# Patient Record
Sex: Male | Born: 1995 | Race: White | Hispanic: No | Marital: Single
Health system: Southern US, Community
[De-identification: ages and names within clinical notes are randomized; demographics above are authoritative.]

## PROBLEM LIST (undated history)

## (undated) DIAGNOSIS — I10 Essential (primary) hypertension: Secondary | ICD-10-CM

---
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
## 2019-01-12 ENCOUNTER — Emergency Department (HOSPITAL_COMMUNITY): Payer: Medicaid - Out of State

## 2019-01-12 ENCOUNTER — Other Ambulatory Visit: Payer: Self-pay

## 2019-01-12 ENCOUNTER — Encounter (HOSPITAL_COMMUNITY): Payer: Self-pay

## 2019-01-12 ENCOUNTER — Emergency Department (HOSPITAL_COMMUNITY)
Admission: EM | Admit: 2019-01-12 | Discharge: 2019-01-12 | Disposition: A | Discharge status: Home / Self Care | Payer: Medicaid - Out of State | Attending: Emergency Medicine | Admitting: Emergency Medicine

## 2019-01-12 DIAGNOSIS — Y9301 Activity, walking, marching and hiking: Secondary | ICD-10-CM | POA: Insufficient documentation

## 2019-01-12 DIAGNOSIS — Z87891 Personal history of nicotine dependence: Secondary | ICD-10-CM | POA: Diagnosis not present

## 2019-01-12 DIAGNOSIS — I10 Essential (primary) hypertension: Secondary | ICD-10-CM | POA: Diagnosis not present

## 2019-01-12 DIAGNOSIS — Y929 Unspecified place or not applicable: Secondary | ICD-10-CM | POA: Insufficient documentation

## 2019-01-12 DIAGNOSIS — S8261XA Displaced fracture of lateral malleolus of right fibula, initial encounter for closed fracture: Secondary | ICD-10-CM | POA: Insufficient documentation

## 2019-01-12 DIAGNOSIS — Y999 Unspecified external cause status: Secondary | ICD-10-CM | POA: Diagnosis not present

## 2019-01-12 DIAGNOSIS — S99911A Unspecified injury of right ankle, initial encounter: Secondary | ICD-10-CM | POA: Diagnosis present

## 2019-01-12 DIAGNOSIS — W108XXA Fall (on) (from) other stairs and steps, initial encounter: Secondary | ICD-10-CM | POA: Diagnosis not present

## 2019-01-12 DIAGNOSIS — S82831A Other fracture of upper and lower end of right fibula, initial encounter for closed fracture: Secondary | ICD-10-CM

## 2019-01-12 DIAGNOSIS — Z79899 Other long term (current) drug therapy: Secondary | ICD-10-CM | POA: Insufficient documentation

## 2019-01-12 DIAGNOSIS — Z880 Allergy status to penicillin: Secondary | ICD-10-CM | POA: Insufficient documentation

## 2019-01-12 HISTORY — DX: Essential (primary) hypertension: I10

## 2019-01-12 MED ORDER — IBUPROFEN 600 MG PO TABS: 600.0000 mg | ORAL_TABLET | Freq: Four times a day (QID) | ORAL | 0 refills | Status: AC | PRN

## 2019-01-12 MED ORDER — HYDROCODONE-ACETAMINOPHEN 5-325 MG PO TABS: 1.0000 | ORAL_TABLET | Freq: Four times a day (QID) | ORAL | 0 refills | Status: AC | PRN

## 2019-01-12 NOTE — ED Provider Notes (Signed)
[facility] DEPT Provider Note   CSN: [id number] Arrival date & time: 01/12/19  0413    History   Chief Complaint Chief Complaint  Patient presents with  . Ankle Pain    HPI Jay Cole is a 23 y.o. male.     23 year old male presents to the emergency department for evaluation of right ankle pain.  He reports falling down approximately 5-6 steps tonight while he was carrying groceries and a new gaming system.  Has been experiencing constant, unchanged pain to the ankle.  This was splinted by EMS.  He reports receiving pain medication in route with some slight improvement to discomfort.  History of prior sprain and fracture to the right ankle, per patient.  Is not actively followed by orthopedics.  Denies any associated numbness or paresthesias.  The history is provided by the patient. No language interpreter was used.  Ankle Pain   Past Medical History:  Diagnosis Date  . Hypertension     There are no active problems to display for this patient.   No past surgical history on file.      Home Medications    Prior to Admission medications   Medication Sig Start Date End Date Taking? Authorizing Provider  acetaminophen (TYLENOL) 325 MG tablet Take 650 mg by mouth every 6 (six) hours as needed for moderate pain.   Yes [provider]  lamoTRIgine (LAMICTAL) 150 MG tablet Take 150 mg by mouth daily.   Yes [provider]  QUEtiapine (SEROQUEL) 300 MG tablet Take 150 mg by mouth at bedtime.   Yes [provider]  HYDROcodone-acetaminophen (NORCO/VICODIN) 5-325 MG tablet Take 1 tablet by mouth every 6 (six) hours as needed for severe pain. 01/12/19   Antonietta Breach, PA-C  ibuprofen (ADVIL) 600 MG tablet Take 1 tablet (600 mg total) by mouth every 6 (six) hours as needed. 01/12/19   Antonietta Breach, PA-C    Family History No family history on file.  Social History Social History   Tobacco Use  . Smoking status: Former  Smoker    Types: Cigarettes    Quit date: 06/20/2018    Years since quitting: 0.5  Substance Use Topics  . Alcohol use: No  . Drug use: No     Allergies   Codeine and Penicillins   Review of Systems Review of Systems Ten systems reviewed and are negative for acute change, except as noted in the HPI.    Physical Exam Updated Vital Signs BP (!) 147/91 (BP Location: Right Arm)   Pulse (!) 101   Temp 98.1 F (36.7 C) (Oral)   Resp 20   Ht 5\' 7"  (1.702 m)   Wt 113.4 kg   SpO2 97%   BMI 39.16 kg/m   Physical Exam Vitals signs and nursing note reviewed.  Constitutional:      General: He is not in acute distress.    Appearance: He is well-developed. He is not diaphoretic.     Comments: Nontoxic appearing and in NAD  HENT:     Head: Normocephalic and atraumatic.  Eyes:     General: No scleral icterus.    Conjunctiva/sclera: Conjunctivae normal.  Neck:     Musculoskeletal: Normal range of motion.  Cardiovascular:     Rate and Rhythm: Normal rate and regular rhythm.     Pulses: Normal pulses.     Comments: DP pulse 2+ in the RLE.  Capillary refill brisk in all digits of the right foot. Pulmonary:  Effort: Pulmonary effort is normal. No respiratory distress.     Comments: Respirations even and unlabored Musculoskeletal: Normal range of motion.        General: Swelling, tenderness, deformity (R ankle) and signs of injury present.     Comments: There is swelling to the lateral malleolus of the right ankle with loss of landmark at the site of the medial malleolus.  Swelling noted to the right ankle without crepitus.  Compartments of the right lower extremity are soft, compressible.  No significant effusion.  No erythema.  Skin:    General: Skin is warm and dry.     Coloration: Skin is not pale.     Findings: No erythema or rash.  Neurological:     General: No focal deficit present.     Mental Status: He is alert and oriented to person, place, and time.      Coordination: Coordination normal.     Comments: Sensation to light touch intact.  Patient able to wiggle all toes.  Psychiatric:        Behavior: Behavior normal.      ED Treatments / Results  Labs (all labs ordered are listed, but only abnormal results are displayed) Labs Reviewed - No data to display  EKG None  Radiology Dg Ankle Complete Right  Result Date: 01/12/2019 CLINICAL DATA:  Fall with ankle pain. History of multiple ankle fractures. EXAM: RIGHT ANKLE - COMPLETE 3+ VIEW COMPARISON:  None. FINDINGS: Moderate soft tissue swelling, greatest at the lateral malleolus. There is a fracture fragment at the inferior aspect of the lateral malleolus, with minimal displacement. Eight larger adjacent fragment is age indeterminate and could be related to a remote injury. IMPRESSION: Minimally displaced acute fracture of the inferior aspect of the right lateral malleolus with associated soft tissue swelling. Larger adjacent fragment may be related to remote trauma Electronically Signed   By: Ulyses Jarred M.D.   On: 01/12/2019 05:23    Procedures Procedures (including critical care time)  Medications Ordered in ED Medications - No data to display   Initial Impression / Assessment and Plan / ED Course  I have reviewed the triage vital signs and the nursing notes.  Pertinent labs & imaging results that were available during my care of the patient were reviewed by me and considered in my medical decision making (see chart for details).        Patient presenting to the emergency department for evaluation of right ankle pain after falling down a few stairs tonight.  He is neurovascularly intact with obvious swelling to the lateral malleolus.  His x-ray shows likely acute fracture to the distal fibula.  This is minimally displaced.  He was put in a cam walker and given crutches for WBAT.  Will refer to Orthopedics for follow up.  Return precautions discussed and provided. Patient  discharged in stable condition with no unaddressed concerns.   Final Clinical Impressions(s) / ED Diagnoses   Final diagnoses:  Closed fracture of distal end of right fibula, unspecified fracture morphology, initial encounter    ED Discharge Orders         Ordered    ibuprofen (ADVIL) 600 MG tablet  Every 6 hours PRN     01/12/19 0619    HYDROcodone-acetaminophen (NORCO/VICODIN) 5-325 MG tablet  Every 6 hours PRN     01/12/19 0619           Antonietta Breach, PA-C 01/12/19 [place], [place], MD 01/14/19 [phone]

## 2019-01-12 NOTE — Discharge Instructions (Signed)
We recommend that you wear a cam walker at all times for stabilization of your fracture.  Use crutches to prevent from putting weight on your right foot.  Call the office of Dr. Wynelle Link on Monday to schedule follow-up in 1 to 2 weeks.  Ice your ankle 3-4 times per day.  Take ibuprofen for management of pain.  You may use Norco as needed for severe pain.  Do not drive or drink alcohol after taking this medication as it may make you drowsy and impair your judgment.  Return to the ED for any new or concerning symptoms.

## 2019-01-12 NOTE — ED Notes (Signed)
Pt verbalized discharge instructions and follow up care. Alert and ambulatory. Instructed and demonstrated how to use cam walker and crutches. No iv.

## 2019-01-12 NOTE — ED Triage Notes (Addendum)
Per ems: Pt coming from home c/o right ankle pain after falling down the stairs (5-6 steps approx). Swollen and noted deformity. Pedal pulses present. Reports no etoh. Currently splinted by ems. No LOC or head/ neck pain  20G L AC  200mg  fentanyl given by ems  144/88 98 HR 98% RA 194 CBG (pt has prediabetes) 18 RR

## 2019-06-24 ENCOUNTER — Encounter (HOSPITAL_COMMUNITY): Admission: EM | Disposition: E | Payer: Self-pay | Source: Home / Self Care

## 2019-06-24 ENCOUNTER — Inpatient Hospital Stay (HOSPITAL_COMMUNITY)
Admission: EM | Admit: 2019-06-24 | Discharge: 2019-09-19 | DRG: 003 | Disposition: E | Payer: Medicaid - Out of State | Attending: Neurological Surgery | Admitting: Neurological Surgery

## 2019-06-24 ENCOUNTER — Emergency Department (HOSPITAL_COMMUNITY): Payer: Medicaid - Out of State

## 2019-06-24 ENCOUNTER — Emergency Department (HOSPITAL_COMMUNITY): Payer: Medicaid - Out of State | Admitting: Anesthesiology

## 2019-06-24 ENCOUNTER — Other Ambulatory Visit: Payer: Self-pay

## 2019-06-24 DIAGNOSIS — Z880 Allergy status to penicillin: Secondary | ICD-10-CM

## 2019-06-24 DIAGNOSIS — Z452 Encounter for adjustment and management of vascular access device: Secondary | ICD-10-CM

## 2019-06-24 DIAGNOSIS — J9621 Acute and chronic respiratory failure with hypoxia: Secondary | ICD-10-CM

## 2019-06-24 DIAGNOSIS — S51822A Laceration with foreign body of left forearm, initial encounter: Secondary | ICD-10-CM | POA: Diagnosis present

## 2019-06-24 DIAGNOSIS — Z515 Encounter for palliative care: Secondary | ICD-10-CM | POA: Diagnosis not present

## 2019-06-24 DIAGNOSIS — S50852A Superficial foreign body of left forearm, initial encounter: Secondary | ICD-10-CM

## 2019-06-24 DIAGNOSIS — I959 Hypotension, unspecified: Secondary | ICD-10-CM | POA: Diagnosis not present

## 2019-06-24 DIAGNOSIS — R402112 Coma scale, eyes open, never, at arrival to emergency department: Secondary | ICD-10-CM | POA: Diagnosis present

## 2019-06-24 DIAGNOSIS — E669 Obesity, unspecified: Secondary | ICD-10-CM | POA: Diagnosis present

## 2019-06-24 DIAGNOSIS — R339 Retention of urine, unspecified: Secondary | ICD-10-CM | POA: Diagnosis not present

## 2019-06-24 DIAGNOSIS — E233 Hypothalamic dysfunction, not elsewhere classified: Secondary | ICD-10-CM | POA: Diagnosis not present

## 2019-06-24 DIAGNOSIS — R627 Adult failure to thrive: Secondary | ICD-10-CM

## 2019-06-24 DIAGNOSIS — E870 Hyperosmolality and hypernatremia: Secondary | ICD-10-CM | POA: Diagnosis not present

## 2019-06-24 DIAGNOSIS — E873 Alkalosis: Secondary | ICD-10-CM | POA: Diagnosis not present

## 2019-06-24 DIAGNOSIS — I10 Essential (primary) hypertension: Secondary | ICD-10-CM | POA: Diagnosis present

## 2019-06-24 DIAGNOSIS — E039 Hypothyroidism, unspecified: Secondary | ICD-10-CM | POA: Diagnosis present

## 2019-06-24 DIAGNOSIS — J189 Pneumonia, unspecified organism: Secondary | ICD-10-CM

## 2019-06-24 DIAGNOSIS — E8770 Fluid overload, unspecified: Secondary | ICD-10-CM | POA: Diagnosis present

## 2019-06-24 DIAGNOSIS — Z885 Allergy status to narcotic agent status: Secondary | ICD-10-CM | POA: Diagnosis not present

## 2019-06-24 DIAGNOSIS — M7989 Other specified soft tissue disorders: Secondary | ICD-10-CM | POA: Diagnosis not present

## 2019-06-24 DIAGNOSIS — J90 Pleural effusion, not elsewhere classified: Secondary | ICD-10-CM | POA: Diagnosis not present

## 2019-06-24 DIAGNOSIS — Z66 Do not resuscitate: Secondary | ICD-10-CM | POA: Diagnosis not present

## 2019-06-24 DIAGNOSIS — Z20822 Contact with and (suspected) exposure to covid-19: Secondary | ICD-10-CM | POA: Diagnosis present

## 2019-06-24 DIAGNOSIS — Y92413 State road as the place of occurrence of the external cause: Secondary | ICD-10-CM | POA: Diagnosis not present

## 2019-06-24 DIAGNOSIS — J9690 Respiratory failure, unspecified, unspecified whether with hypoxia or hypercapnia: Secondary | ICD-10-CM

## 2019-06-24 DIAGNOSIS — R402322 Coma scale, best motor response, extension, at arrival to emergency department: Secondary | ICD-10-CM | POA: Diagnosis present

## 2019-06-24 DIAGNOSIS — G936 Cerebral edema: Secondary | ICD-10-CM | POA: Diagnosis present

## 2019-06-24 DIAGNOSIS — Z6841 Body Mass Index (BMI) 40.0 and over, adult: Secondary | ICD-10-CM | POA: Diagnosis not present

## 2019-06-24 DIAGNOSIS — G9608 Other cranial cerebrospinal fluid leak: Secondary | ICD-10-CM | POA: Diagnosis present

## 2019-06-24 DIAGNOSIS — J80 Acute respiratory distress syndrome: Secondary | ICD-10-CM

## 2019-06-24 DIAGNOSIS — S27322A Contusion of lung, bilateral, initial encounter: Secondary | ICD-10-CM | POA: Diagnosis present

## 2019-06-24 DIAGNOSIS — R4182 Altered mental status, unspecified: Secondary | ICD-10-CM | POA: Diagnosis present

## 2019-06-24 DIAGNOSIS — S0990XA Unspecified injury of head, initial encounter: Secondary | ICD-10-CM

## 2019-06-24 DIAGNOSIS — S064XAA Epidural hemorrhage with loss of consciousness status unknown, initial encounter: Secondary | ICD-10-CM | POA: Diagnosis present

## 2019-06-24 DIAGNOSIS — S20312A Abrasion of left front wall of thorax, initial encounter: Secondary | ICD-10-CM | POA: Diagnosis present

## 2019-06-24 DIAGNOSIS — J15211 Pneumonia due to Methicillin susceptible Staphylococcus aureus: Secondary | ICD-10-CM | POA: Diagnosis not present

## 2019-06-24 DIAGNOSIS — E876 Hypokalemia: Secondary | ICD-10-CM | POA: Diagnosis not present

## 2019-06-24 DIAGNOSIS — R739 Hyperglycemia, unspecified: Secondary | ICD-10-CM | POA: Diagnosis present

## 2019-06-24 DIAGNOSIS — S065X9A Traumatic subdural hemorrhage with loss of consciousness of unspecified duration, initial encounter: Secondary | ICD-10-CM | POA: Diagnosis present

## 2019-06-24 DIAGNOSIS — E781 Pure hyperglyceridemia: Secondary | ICD-10-CM | POA: Diagnosis not present

## 2019-06-24 DIAGNOSIS — R6889 Other general symptoms and signs: Secondary | ICD-10-CM

## 2019-06-24 DIAGNOSIS — S064X9A Epidural hemorrhage with loss of consciousness of unspecified duration, initial encounter: Secondary | ICD-10-CM | POA: Diagnosis present

## 2019-06-24 DIAGNOSIS — Z9189 Other specified personal risk factors, not elsewhere classified: Secondary | ICD-10-CM

## 2019-06-24 DIAGNOSIS — R402212 Coma scale, best verbal response, none, at arrival to emergency department: Secondary | ICD-10-CM | POA: Diagnosis present

## 2019-06-24 DIAGNOSIS — Z9911 Dependence on respirator [ventilator] status: Secondary | ICD-10-CM | POA: Diagnosis not present

## 2019-06-24 DIAGNOSIS — R1310 Dysphagia, unspecified: Secondary | ICD-10-CM | POA: Diagnosis not present

## 2019-06-24 DIAGNOSIS — R946 Abnormal results of thyroid function studies: Secondary | ICD-10-CM | POA: Diagnosis not present

## 2019-06-24 DIAGNOSIS — S22011A Stable burst fracture of first thoracic vertebra, initial encounter for closed fracture: Secondary | ICD-10-CM | POA: Diagnosis present

## 2019-06-24 DIAGNOSIS — Z7189 Other specified counseling: Secondary | ICD-10-CM

## 2019-06-24 DIAGNOSIS — Z930 Tracheostomy status: Secondary | ICD-10-CM

## 2019-06-24 DIAGNOSIS — L8990 Pressure ulcer of unspecified site, unspecified stage: Secondary | ICD-10-CM | POA: Insufficient documentation

## 2019-06-24 DIAGNOSIS — Z9289 Personal history of other medical treatment: Secondary | ICD-10-CM

## 2019-06-24 DIAGNOSIS — I472 Ventricular tachycardia: Secondary | ICD-10-CM | POA: Diagnosis not present

## 2019-06-24 DIAGNOSIS — T148XXA Other injury of unspecified body region, initial encounter: Secondary | ICD-10-CM

## 2019-06-24 DIAGNOSIS — S0990XS Unspecified injury of head, sequela: Secondary | ICD-10-CM | POA: Diagnosis not present

## 2019-06-24 DIAGNOSIS — I469 Cardiac arrest, cause unspecified: Secondary | ICD-10-CM | POA: Diagnosis not present

## 2019-06-24 DIAGNOSIS — R509 Fever, unspecified: Secondary | ICD-10-CM

## 2019-06-24 HISTORY — PX: CRANIOTOMY: SHX93

## 2019-06-24 LAB — ETHANOL: Alcohol, Ethyl (B): <10 mg/dL (ref <10)

## 2019-06-24 LAB — POCT I-STAT 7, (LYTES, BLD GAS, ICA,H+H)
Acid-base deficit: 8 mmol/L — ABNORMAL HIGH (ref 0.0–2.0)
Bicarbonate: 20.8 mmol/L (ref 20.0–28.0)
Calcium, Ion: 1.22 mmol/L (ref 1.15–1.40)
HCT: 47 % (ref 39.0–52.0)
Hemoglobin: 16 g/dL (ref 13.0–17.0)
O2 Saturation: 91 %
Potassium: 3.2 mmol/L — ABNORMAL LOW (ref 3.5–5.1)
Sodium: 139 mmol/L (ref 135–145)
TCO2: 22 mmol/L (ref 22–32)
pCO2 arterial: 53 mmHg — ABNORMAL HIGH (ref 32.0–48.0)
pH, Arterial: 7.202 — ABNORMAL LOW (ref 7.350–7.450)
pO2, Arterial: 76 mmHg — ABNORMAL LOW (ref 83.0–108.0)

## 2019-06-24 LAB — I-STAT CHEM 8, ED
BUN: 6 mg/dL (ref 6–20)
Calcium, Ion: 1.11 mmol/L — ABNORMAL LOW (ref 1.15–1.40)
Chloride: 104 mmol/L (ref 98–111)
Creatinine, Ser: 1.1 mg/dL (ref 0.61–1.24)
Glucose, Bld: 169 mg/dL — ABNORMAL HIGH (ref 70–99)
HCT: 52 % (ref 39.0–52.0)
Hemoglobin: 17.7 g/dL — ABNORMAL HIGH (ref 13.0–17.0)
Potassium: 3.3 mmol/L — ABNORMAL LOW (ref 3.5–5.1)
Sodium: 143 mmol/L (ref 135–145)
TCO2: 23 mmol/L (ref 22–32)

## 2019-06-24 LAB — PROTIME-INR
INR: 1.1 (ref 0.8–1.2)
Prothrombin Time: 14.2 seconds (ref 11.4–15.2)

## 2019-06-24 LAB — COMPREHENSIVE METABOLIC PANEL
ALT: 127 U/L — ABNORMAL HIGH (ref 0–44)
AST: 88 U/L — ABNORMAL HIGH (ref 15–41)
Albumin: 3.9 g/dL (ref 3.5–5.0)
Alkaline Phosphatase: 106 U/L (ref 38–126)
Anion gap: 17 — ABNORMAL HIGH (ref 5–15)
BUN: 7 mg/dL (ref 6–20)
CO2: 20 mmol/L — ABNORMAL LOW (ref 22–32)
Calcium: 8.7 mg/dL — ABNORMAL LOW (ref 8.9–10.3)
Chloride: 104 mmol/L (ref 98–111)
Creatinine, Ser: 1.22 mg/dL (ref 0.61–1.24)
GFR calc Af Amer: >60 mL/min (ref >60)
GFR calc non Af Amer: >60 mL/min (ref >60)
Glucose, Bld: 182 mg/dL — ABNORMAL HIGH (ref 70–99)
Potassium: 3.5 mmol/L (ref 3.5–5.1)
Sodium: 141 mmol/L (ref 135–145)
Total Bilirubin: 0.5 mg/dL (ref 0.3–1.2)
Total Protein: 6.6 g/dL (ref 6.5–8.1)

## 2019-06-24 LAB — CBC
HCT: 54 % — ABNORMAL HIGH (ref 39.0–52.0)
Hemoglobin: 17 g/dL (ref 13.0–17.0)
MCH: 29.1 pg (ref 26.0–34.0)
MCHC: 31.5 g/dL (ref 30.0–36.0)
MCV: 92.5 fL (ref 80.0–100.0)
Platelets: 345 10*3/uL (ref 150–400)
RBC: 5.84 MIL/uL — ABNORMAL HIGH (ref 4.22–5.81)
RDW: 12.4 % (ref 11.5–15.5)
WBC: 20.5 10*3/uL — ABNORMAL HIGH (ref 4.0–10.5)
nRBC: 0 % (ref 0.0–0.2)

## 2019-06-24 LAB — CDS SEROLOGY

## 2019-06-24 LAB — RESPIRATORY PANEL BY RT PCR (FLU A&B, COVID)
Influenza A by PCR: NEGATIVE
Influenza B by PCR: NEGATIVE
SARS Coronavirus 2 by RT PCR: NEGATIVE

## 2019-06-24 SURGERY — CRANIOTOMY HEMATOMA EVACUATION SUBDURAL
Anesthesia: General | Site: Head | Laterality: Left

## 2019-06-24 MED ORDER — ROCURONIUM BROMIDE 10 MG/ML (PF) SYRINGE
PREFILLED_SYRINGE | INTRAVENOUS | Status: AC
  Filled 2019-06-24: qty 10

## 2019-06-24 MED ORDER — PROPOFOL 1000 MG/100ML IV EMUL
5.0000 ug/kg/min | INTRAVENOUS | Status: DC
  Administered 2019-06-25: 40 ug/kg/min via INTRAVENOUS
  Administered 2019-06-25: 20 ug/kg/min via INTRAVENOUS
  Administered 2019-06-25 – 2019-06-26 (×10): 40 ug/kg/min via INTRAVENOUS
  Administered 2019-06-27: 20 ug/kg/min via INTRAVENOUS
  Administered 2019-06-27: 02:00:00 40 ug/kg/min via INTRAVENOUS
  Administered 2019-06-28 (×2): 20 ug/kg/min via INTRAVENOUS
  Administered 2019-06-28 – 2019-06-29 (×2): 35 ug/kg/min via INTRAVENOUS
  Administered 2019-06-29: 15:00:00 46.471 ug/kg/min via INTRAVENOUS
  Administered 2019-06-29: 35 ug/kg/min via INTRAVENOUS
  Administered 2019-06-29: 46.471 ug/kg/min via INTRAVENOUS
  Administered 2019-06-29 (×2): 35 ug/kg/min via INTRAVENOUS
  Administered 2019-06-30: 30 ug/kg/min via INTRAVENOUS
  Administered 2019-06-30: 10 ug/kg/min via INTRAVENOUS
  Administered 2019-07-01 – 2019-07-02 (×10): 40 ug/kg/min via INTRAVENOUS
  Administered 2019-07-02 – 2019-07-04 (×12): 50 ug/kg/min via INTRAVENOUS
  Administered 2019-07-04: 40 ug/kg/min via INTRAVENOUS
  Administered 2019-07-05 – 2019-07-08 (×23): 50 ug/kg/min via INTRAVENOUS
  Administered 2019-07-09 (×2): 40 ug/kg/min via INTRAVENOUS
  Administered 2019-07-09: 50 ug/kg/min via INTRAVENOUS
  Administered 2019-07-09: 40 ug/kg/min via INTRAVENOUS
  Administered 2019-07-09: 50 ug/kg/min via INTRAVENOUS
  Administered 2019-07-10: 30 ug/kg/min via INTRAVENOUS
  Administered 2019-07-10: 12:00:00 40 ug/kg/min via INTRAVENOUS
  Administered 2019-07-10: 35 ug/kg/min via INTRAVENOUS
  Administered 2019-07-10 – 2019-07-11 (×2): 30 ug/kg/min via INTRAVENOUS
  Administered 2019-07-11: 35 ug/kg/min via INTRAVENOUS
  Administered 2019-07-11: 30 ug/kg/min via INTRAVENOUS
  Administered 2019-07-12: 45 ug/kg/min via INTRAVENOUS
  Administered 2019-07-12: 40 ug/kg/min via INTRAVENOUS
  Administered 2019-07-12: 30 ug/kg/min via INTRAVENOUS
  Administered 2019-07-12 – 2019-07-13 (×2): 40 ug/kg/min via INTRAVENOUS
  Filled 2019-06-24 (×7): qty 100
  Filled 2019-06-24 (×2): qty 200
  Filled 2019-06-24 (×4): qty 100
  Filled 2019-06-24: qty 300
  Filled 2019-06-24: qty 200
  Filled 2019-06-24 (×24): qty 100
  Filled 2019-06-24: qty 50
  Filled 2019-06-24 (×5): qty 100
  Filled 2019-06-24: qty 200
  Filled 2019-06-24 (×15): qty 100
  Filled 2019-06-24: qty 50
  Filled 2019-06-24 (×23): qty 100

## 2019-06-24 MED ORDER — INSULIN ASPART 100 UNIT/ML ~~LOC~~ SOLN
0.0000 [IU] | SUBCUTANEOUS | Status: DC
  Administered 2019-06-25 – 2019-06-26 (×4): 3 [IU] via SUBCUTANEOUS
  Administered 2019-06-27 (×2): 4 [IU] via SUBCUTANEOUS
  Administered 2019-06-27 (×3): 3 [IU] via SUBCUTANEOUS
  Administered 2019-06-28 – 2019-06-29 (×6): 4 [IU] via SUBCUTANEOUS
  Administered 2019-06-29: 3 [IU] via SUBCUTANEOUS
  Administered 2019-06-29 (×2): 4 [IU] via SUBCUTANEOUS
  Administered 2019-06-29: 16:00:00 2 [IU] via SUBCUTANEOUS
  Administered 2019-06-29 (×2): 4 [IU] via SUBCUTANEOUS
  Administered 2019-06-30: 3 [IU] via SUBCUTANEOUS
  Administered 2019-06-30: 09:00:00 4 [IU] via SUBCUTANEOUS
  Administered 2019-06-30 – 2019-07-05 (×16): 3 [IU] via SUBCUTANEOUS
  Administered 2019-07-05: 4 [IU] via SUBCUTANEOUS
  Administered 2019-07-06: 3 [IU] via SUBCUTANEOUS
  Administered 2019-07-06: 16:00:00 4 [IU] via SUBCUTANEOUS
  Administered 2019-07-06: 3 [IU] via SUBCUTANEOUS
  Administered 2019-07-06: 4 [IU] via SUBCUTANEOUS
  Administered 2019-07-07 – 2019-07-11 (×17): 3 [IU] via SUBCUTANEOUS
  Administered 2019-07-12: 2 [IU] via SUBCUTANEOUS
  Administered 2019-07-12: 3 [IU] via SUBCUTANEOUS
  Administered 2019-07-12 (×2): 4 [IU] via SUBCUTANEOUS
  Administered 2019-07-13 – 2019-07-17 (×16): 3 [IU] via SUBCUTANEOUS
  Administered 2019-07-17: 4 [IU] via SUBCUTANEOUS
  Administered 2019-07-18 (×3): 3 [IU] via SUBCUTANEOUS
  Administered 2019-07-19 – 2019-07-20 (×2): 4 [IU] via SUBCUTANEOUS
  Administered 2019-07-20 – 2019-07-21 (×5): 3 [IU] via SUBCUTANEOUS
  Administered 2019-07-21: 4 [IU] via SUBCUTANEOUS
  Administered 2019-07-21 (×2): 3 [IU] via SUBCUTANEOUS
  Administered 2019-07-21 – 2019-07-22 (×4): 4 [IU] via SUBCUTANEOUS
  Administered 2019-07-22: 3 [IU] via SUBCUTANEOUS
  Administered 2019-07-23 (×4): 4 [IU] via SUBCUTANEOUS
  Administered 2019-07-24: 3 [IU] via SUBCUTANEOUS
  Administered 2019-07-24: 4 [IU] via SUBCUTANEOUS
  Administered 2019-07-24 – 2019-07-25 (×6): 3 [IU] via SUBCUTANEOUS
  Administered 2019-07-25: 4 [IU] via SUBCUTANEOUS
  Administered 2019-07-26 – 2019-07-31 (×19): 3 [IU] via SUBCUTANEOUS
  Administered 2019-07-31: 4 [IU] via SUBCUTANEOUS
  Administered 2019-07-31: 13:00:00 3 [IU] via SUBCUTANEOUS
  Administered 2019-08-01: 4 [IU] via SUBCUTANEOUS
  Administered 2019-08-01: 3 [IU] via SUBCUTANEOUS
  Administered 2019-08-01: 4 [IU] via SUBCUTANEOUS
  Administered 2019-08-01 – 2019-08-03 (×7): 3 [IU] via SUBCUTANEOUS
  Administered 2019-08-03: 4 [IU] via SUBCUTANEOUS
  Administered 2019-08-04 (×2): 3 [IU] via SUBCUTANEOUS
  Administered 2019-08-04: 4 [IU] via SUBCUTANEOUS
  Administered 2019-08-06 – 2019-08-09 (×7): 3 [IU] via SUBCUTANEOUS
  Administered 2019-08-09 – 2019-08-10 (×2): 4 [IU] via SUBCUTANEOUS
  Administered 2019-08-10 – 2019-08-16 (×11): 3 [IU] via SUBCUTANEOUS
  Administered 2019-08-17: 4 [IU] via SUBCUTANEOUS
  Administered 2019-08-17 (×3): 3 [IU] via SUBCUTANEOUS
  Administered 2019-08-17: 4 [IU] via SUBCUTANEOUS
  Administered 2019-08-18 – 2019-08-20 (×9): 3 [IU] via SUBCUTANEOUS
  Administered 2019-08-20: 4 [IU] via SUBCUTANEOUS
  Administered 2019-08-20 – 2019-08-27 (×16): 3 [IU] via SUBCUTANEOUS
  Administered 2019-08-27: 4 [IU] via SUBCUTANEOUS
  Administered 2019-08-27: 3 [IU] via SUBCUTANEOUS
  Administered 2019-08-27 – 2019-08-28 (×2): 4 [IU] via SUBCUTANEOUS
  Administered 2019-08-28 – 2019-08-31 (×18): 3 [IU] via SUBCUTANEOUS
  Administered 2019-08-31: 4 [IU] via SUBCUTANEOUS
  Administered 2019-09-01 (×2): 3 [IU] via SUBCUTANEOUS
  Administered 2019-09-01: 4 [IU] via SUBCUTANEOUS
  Administered 2019-09-01 – 2019-09-02 (×5): 3 [IU] via SUBCUTANEOUS
  Administered 2019-09-02: 4 [IU] via SUBCUTANEOUS
  Administered 2019-09-02 – 2019-09-07 (×17): 3 [IU] via SUBCUTANEOUS
  Administered 2019-09-07 – 2019-09-08 (×3): 4 [IU] via SUBCUTANEOUS
  Administered 2019-09-08: 7 [IU] via SUBCUTANEOUS
  Administered 2019-09-08 (×4): 4 [IU] via SUBCUTANEOUS
  Administered 2019-09-09: 3 [IU] via SUBCUTANEOUS
  Administered 2019-09-09 – 2019-09-10 (×6): 4 [IU] via SUBCUTANEOUS
  Administered 2019-09-10: 3 [IU] via SUBCUTANEOUS
  Administered 2019-09-11: 4 [IU] via SUBCUTANEOUS

## 2019-06-24 MED ORDER — ONDANSETRON 4 MG PO TBDP: 4.0000 mg | ORAL_TABLET | Freq: Four times a day (QID) | ORAL | Status: DC | PRN

## 2019-06-24 MED ORDER — PROPOFOL 10 MG/ML IV BOLUS
INTRAVENOUS | Status: AC
  Filled 2019-06-24: qty 20

## 2019-06-24 MED ORDER — IOHEXOL 300 MG/ML  SOLN
100.0000 mL | Freq: Once | INTRAMUSCULAR | Status: AC | PRN
  Administered 2019-06-24: 100 mL via INTRAVENOUS

## 2019-06-24 MED ORDER — LIDOCAINE-EPINEPHRINE 1 %-1:100000 IJ SOLN
INTRAMUSCULAR | Status: AC
  Filled 2019-06-24: qty 1

## 2019-06-24 MED ORDER — FENTANYL CITRATE (PF) 100 MCG/2ML IJ SOLN
INTRAMUSCULAR | Status: AC
  Filled 2019-06-24: qty 2

## 2019-06-24 MED ORDER — DOCUSATE SODIUM 50 MG/5ML PO LIQD
100.0000 mg | Freq: Two times a day (BID) | ORAL | Status: DC
  Administered 2019-06-25 – 2019-06-28 (×8): 100 mg
  Filled 2019-06-24 (×9): qty 10

## 2019-06-24 MED ORDER — PHENYLEPHRINE HCL (PRESSORS) 10 MG/ML IV SOLN
INTRAVENOUS | Status: DC | PRN
  Administered 2019-06-24: 80 ug via INTRAVENOUS

## 2019-06-24 MED ORDER — VANCOMYCIN HCL IN DEXTROSE 1-5 GM/200ML-% IV SOLN
INTRAVENOUS | Status: AC
  Filled 2019-06-24: qty 200

## 2019-06-24 MED ORDER — VANCOMYCIN HCL 1000 MG IV SOLR
INTRAVENOUS | Status: DC | PRN
  Administered 2019-06-24: 22:00:00 1000 mg via INTRAVENOUS

## 2019-06-24 MED ORDER — SUFENTANIL CITRATE 50 MCG/ML IV SOLN
INTRAVENOUS | Status: AC
  Filled 2019-06-24: qty 1

## 2019-06-24 MED ORDER — LIDOCAINE-EPINEPHRINE 1 %-1:100000 IJ SOLN
INTRAMUSCULAR | Status: DC | PRN
  Administered 2019-06-24 (×2): 5 mL

## 2019-06-24 MED ORDER — SUFENTANIL CITRATE 50 MCG/ML IV SOLN
INTRAVENOUS | Status: DC | PRN
  Administered 2019-06-24 (×5): 10 ug via INTRAVENOUS

## 2019-06-24 MED ORDER — THROMBIN 20000 UNITS EX SOLR: CUTANEOUS | Status: DC | PRN

## 2019-06-24 MED ORDER — THROMBIN 5000 UNITS EX SOLR
CUTANEOUS | Status: AC
  Filled 2019-06-24: qty 5000

## 2019-06-24 MED ORDER — THROMBIN 20000 UNITS EX SOLR
CUTANEOUS | Status: AC
  Filled 2019-06-24: qty 20000

## 2019-06-24 MED ORDER — ONDANSETRON HCL 4 MG/2ML IJ SOLN: 4.0000 mg | Freq: Four times a day (QID) | INTRAMUSCULAR | Status: DC | PRN

## 2019-06-24 MED ORDER — FENTANYL 2500MCG IN NS 250ML (10MCG/ML) PREMIX INFUSION
0.0000 ug/h | INTRAVENOUS | Status: DC
  Administered 2019-06-25: 100 ug/h via INTRAVENOUS
  Administered 2019-06-25 – 2019-06-27 (×5): 200 ug/h via INTRAVENOUS
  Administered 2019-06-27: 100 ug/h via INTRAVENOUS
  Administered 2019-06-28 – 2019-06-29 (×2): 150 ug/h via INTRAVENOUS
  Administered 2019-06-29: 125 ug/h via INTRAVENOUS
  Administered 2019-06-30: 150 ug/h via INTRAVENOUS
  Administered 2019-07-01 – 2019-07-02 (×4): 200 ug/h via INTRAVENOUS
  Administered 2019-07-03: 300 ug/h via INTRAVENOUS
  Administered 2019-07-03: 250 ug/h via INTRAVENOUS
  Administered 2019-07-03: 300 ug/h via INTRAVENOUS
  Administered 2019-07-04 – 2019-07-08 (×17): 400 ug/h via INTRAVENOUS
  Administered 2019-07-09 (×2): 300 ug/h via INTRAVENOUS
  Administered 2019-07-09: 375 ug/h via INTRAVENOUS
  Administered 2019-07-10: 300 ug/h via INTRAVENOUS
  Administered 2019-07-10: 275 ug/h via INTRAVENOUS
  Administered 2019-07-11: 250 ug/h via INTRAVENOUS
  Administered 2019-07-11: 275 ug/h via INTRAVENOUS
  Administered 2019-07-11 – 2019-07-12 (×2): 300 ug/h via INTRAVENOUS
  Administered 2019-07-12: 200 ug/h via INTRAVENOUS
  Administered 2019-07-13: 250 ug/h via INTRAVENOUS
  Administered 2019-07-13: 150 ug/h via INTRAVENOUS
  Administered 2019-07-14: 115 ug/h via INTRAVENOUS
  Administered 2019-07-14 – 2019-07-16 (×4): 150 ug/h via INTRAVENOUS
  Administered 2019-07-17: 100 ug/h via INTRAVENOUS
  Administered 2019-07-17: 150 ug/h via INTRAVENOUS
  Administered 2019-07-18: 100 ug/h via INTRAVENOUS
  Administered 2019-07-18 – 2019-07-21 (×6): 150 ug/h via INTRAVENOUS
  Administered 2019-07-22 – 2019-07-25 (×6): 100 ug/h via INTRAVENOUS
  Administered 2019-07-26: 07:00:00 125 ug/h via INTRAVENOUS
  Administered 2019-07-27: 19:00:00 150 ug/h via INTRAVENOUS
  Administered 2019-07-27: 05:00:00 125 ug/h via INTRAVENOUS
  Administered 2019-07-28 – 2019-07-30 (×6): 150 ug/h via INTRAVENOUS
  Administered 2019-07-31: 125 ug/h via INTRAVENOUS
  Administered 2019-07-31: 150 ug/h via INTRAVENOUS
  Administered 2019-08-01: 18:00:00 125 ug/h via INTRAVENOUS
  Administered 2019-08-02: 11:00:00 100 ug/h via INTRAVENOUS
  Administered 2019-08-03: 200 ug/h via INTRAVENOUS
  Administered 2019-08-04: 100 ug/h via INTRAVENOUS
  Administered 2019-08-05: 200 ug/h via INTRAVENOUS
  Administered 2019-08-05 (×2): 100 ug/h via INTRAVENOUS
  Administered 2019-08-06: 13:00:00 85 ug/h via INTRAVENOUS
  Administered 2019-08-07: 14:00:00 175 ug/h via INTRAVENOUS
  Administered 2019-08-08: 200 ug/h via INTRAVENOUS
  Administered 2019-08-08: 175 ug/h via INTRAVENOUS
  Administered 2019-08-09: 200 ug/h via INTRAVENOUS
  Administered 2019-08-09: 150 ug/h via INTRAVENOUS
  Administered 2019-08-10 (×3): 200 ug/h via INTRAVENOUS
  Administered 2019-08-11: 150 ug/h via INTRAVENOUS
  Administered 2019-08-11: 125 ug/h via INTRAVENOUS
  Administered 2019-08-13: 75 ug/h via INTRAVENOUS
  Administered 2019-08-14 – 2019-08-18 (×4): 50 ug/h via INTRAVENOUS
  Administered 2019-08-19 – 2019-08-22 (×3): 100 ug/h via INTRAVENOUS
  Administered 2019-08-22: 150 ug/h via INTRAVENOUS
  Administered 2019-08-23 – 2019-08-24 (×2): 100 ug/h via INTRAVENOUS
  Administered 2019-08-26 – 2019-08-27 (×2): 150 ug/h via INTRAVENOUS
  Administered 2019-08-28: 100 ug/h via INTRAVENOUS
  Administered 2019-08-28 – 2019-08-31 (×5): 150 ug/h via INTRAVENOUS
  Administered 2019-09-01: 200 ug/h via INTRAVENOUS
  Administered 2019-09-01: 150 ug/h via INTRAVENOUS
  Administered 2019-09-02 – 2019-09-03 (×3): 200 ug/h via INTRAVENOUS
  Administered 2019-09-04 (×2): 175 ug/h via INTRAVENOUS
  Administered 2019-09-05: 75 ug/h via INTRAVENOUS
  Filled 2019-06-24 (×131): qty 250

## 2019-06-24 MED ORDER — SODIUM CHLORIDE 0.9 % IV SOLN: INTRAVENOUS | Status: DC | PRN

## 2019-06-24 MED ORDER — SODIUM CHLORIDE (PF) 0.9 % IJ SOLN
INTRAMUSCULAR | Status: AC
  Filled 2019-06-24: qty 10

## 2019-06-24 MED ORDER — PROPOFOL 1000 MG/100ML IV EMUL: 5.0000 ug/kg/min | INTRAVENOUS | Status: DC

## 2019-06-24 MED ORDER — DEXTROSE 5 % IV SOLN
3.0000 g | Freq: Once | INTRAVENOUS | Status: AC
  Administered 2019-06-24: 3 g via INTRAVENOUS
  Filled 2019-06-24 (×2): qty 3000

## 2019-06-24 MED ORDER — ROCURONIUM 10MG/ML (10ML) SYRINGE FOR MEDFUSION PUMP - OPTIME
INTRAVENOUS | Status: DC | PRN
  Administered 2019-06-24: 100 mg via INTRAVENOUS

## 2019-06-24 MED ORDER — METHOCARBAMOL 1000 MG/10ML IJ SOLN
1000.0000 mg | Freq: Three times a day (TID) | INTRAVENOUS | Status: DC
  Administered 2019-06-25 – 2019-07-19 (×72): 1000 mg via INTRAVENOUS
  Filled 2019-06-24 (×44): qty 10
  Filled 2019-06-24: qty 1000
  Filled 2019-06-24: qty 10
  Filled 2019-06-24: qty 1000
  Filled 2019-06-24 (×28): qty 10

## 2019-06-24 MED ORDER — BACITRACIN ZINC 500 UNIT/GM EX OINT
TOPICAL_OINTMENT | CUTANEOUS | Status: AC
  Filled 2019-06-24: qty 28.35

## 2019-06-24 MED ORDER — ETOMIDATE 2 MG/ML IV SOLN
INTRAVENOUS | Status: AC | PRN
  Administered 2019-06-24: 10 mg via INTRAVENOUS

## 2019-06-24 MED ORDER — PANTOPRAZOLE SODIUM 40 MG PO TBEC: 40.0000 mg | DELAYED_RELEASE_TABLET | Freq: Every day | ORAL | Status: DC

## 2019-06-24 MED ORDER — TRANEXAMIC ACID-NACL 1000-0.7 MG/100ML-% IV SOLN
1000.0000 mg | Freq: Once | INTRAVENOUS | Status: AC
  Administered 2019-06-24: 1000 mg via INTRAVENOUS
  Filled 2019-06-24: qty 100

## 2019-06-24 MED ORDER — 0.9 % SODIUM CHLORIDE (POUR BTL) OPTIME
TOPICAL | Status: DC | PRN
  Administered 2019-06-24 (×2): 1000 mL

## 2019-06-24 MED ORDER — TETANUS-DIPHTH-ACELL PERTUSSIS 5-2.5-18.5 LF-MCG/0.5 IM SUSP
0.5000 mL | Freq: Once | INTRAMUSCULAR | Status: AC
  Administered 2019-06-24: 0.5 mL via INTRAMUSCULAR

## 2019-06-24 MED ORDER — LEVETIRACETAM IN NACL 500 MG/100ML IV SOLN
500.0000 mg | Freq: Two times a day (BID) | INTRAVENOUS | Status: DC
  Administered 2019-06-25: 500 mg via INTRAVENOUS
  Filled 2019-06-24: qty 100

## 2019-06-24 MED ORDER — BUPIVACAINE HCL (PF) 0.5 % IJ SOLN
INTRAMUSCULAR | Status: AC
  Filled 2019-06-24: qty 30

## 2019-06-24 MED ORDER — PROPOFOL 1000 MG/100ML IV EMUL
INTRAVENOUS | Status: AC
  Administered 2019-06-24: 20 ug/kg/min via INTRAVENOUS
  Filled 2019-06-24: qty 100

## 2019-06-24 MED ORDER — PANTOPRAZOLE SODIUM 40 MG IV SOLR: 40.0000 mg | Freq: Every day | INTRAVENOUS | Status: DC

## 2019-06-24 MED ORDER — BUPIVACAINE HCL (PF) 0.5 % IJ SOLN
INTRAMUSCULAR | Status: DC | PRN
  Administered 2019-06-24 (×2): 5 mL

## 2019-06-24 MED ORDER — ACETAMINOPHEN 500 MG PO TABS
1000.0000 mg | ORAL_TABLET | Freq: Four times a day (QID) | ORAL | Status: DC
  Administered 2019-06-25 – 2019-06-29 (×17): 1000 mg
  Filled 2019-06-24 (×19): qty 2

## 2019-06-24 MED ORDER — FENTANYL CITRATE (PF) 100 MCG/2ML IJ SOLN
50.0000 ug | Freq: Once | INTRAMUSCULAR | Status: AC
  Administered 2019-06-25: 50 ug via INTRAVENOUS

## 2019-06-24 MED ORDER — OXYCODONE HCL 5 MG/5ML PO SOLN
5.0000 mg | ORAL | Status: DC | PRN
  Administered 2019-06-30 – 2019-08-05 (×35): 10 mg
  Filled 2019-06-24 (×36): qty 10

## 2019-06-24 MED ORDER — FENTANYL BOLUS VIA INFUSION
50.0000 ug | INTRAVENOUS | Status: DC | PRN
  Administered 2019-06-28: 150 ug via INTRAVENOUS
  Administered 2019-06-28 – 2019-08-26 (×93): 50 ug via INTRAVENOUS
  Administered 2019-08-26: 100 ug via INTRAVENOUS
  Administered 2019-08-26 (×3): 50 ug via INTRAVENOUS
  Filled 2019-06-24 (×2): qty 50

## 2019-06-24 MED ORDER — PROPOFOL 10 MG/ML IV BOLUS
INTRAVENOUS | Status: DC | PRN
  Administered 2019-06-24: 50 mg via INTRAVENOUS

## 2019-06-24 MED ORDER — ROCURONIUM BROMIDE 50 MG/5ML IV SOLN
INTRAVENOUS | Status: AC | PRN
  Administered 2019-06-24: 100 mg via INTRAVENOUS

## 2019-06-24 MED ORDER — LACTATED RINGERS IV SOLN: INTRAVENOUS | Status: DC

## 2019-06-24 MED ORDER — THROMBIN 5000 UNITS EX SOLR: OROMUCOSAL | Status: DC | PRN

## 2019-06-24 SURGICAL SUPPLY — 73 items
ADH SKN CLS APL DERMABOND .7 (GAUZE/BANDAGES/DRESSINGS) ×1
BAG DECANTER FOR FLEXI CONT (MISCELLANEOUS) ×3 IMPLANT
BATTERY IQ STERILE (MISCELLANEOUS) ×3 IMPLANT
BIT DRILL WIRE PASS 1.3MM (BIT) IMPLANT
BNDG GAUZE ELAST 4 BULKY (GAUZE/BANDAGES/DRESSINGS) ×3 IMPLANT
BTRY SRG DRVR 1.5 IQ (MISCELLANEOUS) ×1
BUR ACORN 6.0 PRECISION (BURR) ×2 IMPLANT
BUR ACORN 6.0MM PRECISION (BURR) ×1
BUR SPIRAL ROUTER 2.3 (BUR) IMPLANT
BUR SPIRAL ROUTER 2.3MM (BUR)
CANISTER SUCT 3000ML PPV (MISCELLANEOUS) ×5 IMPLANT
COUNTER NEEDLE 20 DBL MAG RED (NEEDLE) ×2 IMPLANT
DECANTER SPIKE VIAL GLASS SM (MISCELLANEOUS) ×3 IMPLANT
DERMABOND ADVANCED (GAUZE/BANDAGES/DRESSINGS) ×2
DERMABOND ADVANCED .7 DNX12 (GAUZE/BANDAGES/DRESSINGS) IMPLANT
DRAPE HALF SHEET 40X57 (DRAPES) ×4 IMPLANT
DRAPE INCISE IOBAN 66X45 STRL (DRAPES) ×4 IMPLANT
DRAPE LAPAROTOMY 100X72 PEDS (DRAPES) ×2 IMPLANT
DRAPE WARM FLUID 44X44 (DRAPES) ×3 IMPLANT
DRILL WIRE PASS 1.3MM (BIT) ×3
DRSG OPSITE 4X5.5 SM (GAUZE/BANDAGES/DRESSINGS) ×4 IMPLANT
DRSG TELFA 3X8 NADH (GAUZE/BANDAGES/DRESSINGS) ×3 IMPLANT
DURAPREP 26ML APPLICATOR (WOUND CARE) ×4 IMPLANT
DURAPREP 6ML APPLICATOR 50/CS (WOUND CARE) ×3 IMPLANT
ELECT CAUTERY BLADE 6.4 (BLADE) ×2 IMPLANT
ELECT REM PT RETURN 9FT ADLT (ELECTROSURGICAL) ×3
ELECTRODE REM PT RTRN 9FT ADLT (ELECTROSURGICAL) ×1 IMPLANT
GAUZE SPONGE 4X4 12PLY STRL (GAUZE/BANDAGES/DRESSINGS) ×2 IMPLANT
GAUZE SPONGE 4X4 16PLY XRAY LF (GAUZE/BANDAGES/DRESSINGS) ×2 IMPLANT
GLOVE BIO SURGEON STRL SZ 6.5 (GLOVE) ×4 IMPLANT
GLOVE BIO SURGEONS STRL SZ 6.5 (GLOVE) ×4
GLOVE BIOGEL PI IND STRL 7.0 (GLOVE) IMPLANT
GLOVE BIOGEL PI IND STRL 7.5 (GLOVE) IMPLANT
GLOVE BIOGEL PI IND STRL 8.5 (GLOVE) ×1 IMPLANT
GLOVE BIOGEL PI INDICATOR 7.0 (GLOVE) ×6
GLOVE BIOGEL PI INDICATOR 7.5 (GLOVE) ×6
GLOVE BIOGEL PI INDICATOR 8.5 (GLOVE) ×4
GLOVE ECLIPSE 8.5 STRL (GLOVE) ×5 IMPLANT
GLOVE EXAM NITRILE XL STR (GLOVE) IMPLANT
GOWN STRL REUS W/ TWL LRG LVL3 (GOWN DISPOSABLE) IMPLANT
GOWN STRL REUS W/ TWL XL LVL3 (GOWN DISPOSABLE) ×1 IMPLANT
GOWN STRL REUS W/TWL 2XL LVL3 (GOWN DISPOSABLE) ×3 IMPLANT
GOWN STRL REUS W/TWL LRG LVL3 (GOWN DISPOSABLE) ×6
GOWN STRL REUS W/TWL XL LVL3 (GOWN DISPOSABLE)
GRAFT DURAGEN MATRIX 3WX3L (Graft) ×3 IMPLANT
GRAFT DURAGEN MATRIX 3X3 SNGL (Graft) IMPLANT
HEMOSTAT POWDER SURGIFOAM 1G (HEMOSTASIS) ×2 IMPLANT
KIT BASIN OR (CUSTOM PROCEDURE TRAY) ×3 IMPLANT
KIT TURNOVER KIT B (KITS) ×3 IMPLANT
NEEDLE HYPO 22GX1.5 SAFETY (NEEDLE) ×3 IMPLANT
NS IRRIG 1000ML POUR BTL (IV SOLUTION) ×7 IMPLANT
PACK CRANIOTOMY CUSTOM (CUSTOM PROCEDURE TRAY) ×3 IMPLANT
PAD DRESSING TELFA 3X8 NADH (GAUZE/BANDAGES/DRESSINGS) IMPLANT
PATTIES SURGICAL .5 X.5 (GAUZE/BANDAGES/DRESSINGS) IMPLANT
PATTIES SURGICAL .5 X3 (DISPOSABLE) IMPLANT
PATTIES SURGICAL 1X1 (DISPOSABLE) IMPLANT
PIN MAYFIELD SKULL DISP (PIN) ×2 IMPLANT
PLATE 1.5  2HOLE LNG NEURO (Plate) ×4 IMPLANT
PLATE 1.5  2HOLE MED NEURO (Plate) ×4 IMPLANT
PLATE 1.5 2HOLE LNG NEURO (Plate) IMPLANT
PLATE 1.5 2HOLE MED NEURO (Plate) IMPLANT
SCREW SELF DRILL HT 1.5/4MM (Screw) ×16 IMPLANT
SPONGE NEURO XRAY DETECT 1X3 (DISPOSABLE) IMPLANT
SPONGE SURGIFOAM ABS GEL 100 (HEMOSTASIS) ×2 IMPLANT
STAPLER SKIN PROX WIDE 3.9 (STAPLE) ×3 IMPLANT
SUT ETHILON 3 0 FSL (SUTURE) IMPLANT
SUT NURALON 4 0 TR CR/8 (SUTURE) ×6 IMPLANT
SUT VIC AB 2-0 CP2 18 (SUTURE) ×8 IMPLANT
SUT VIC AB 3-0 SH 8-18 (SUTURE) ×2 IMPLANT
SUT VIC AB 4-0 RB1 18 (SUTURE) ×2 IMPLANT
TOWEL GREEN STERILE (TOWEL DISPOSABLE) ×3 IMPLANT
TOWEL GREEN STERILE FF (TOWEL DISPOSABLE) ×3 IMPLANT
WATER STERILE IRR 1000ML POUR (IV SOLUTION) ×3 IMPLANT

## 2019-06-24 NOTE — ED Notes (Signed)
Pt noted to be posturing and possibly seizing on arrival. Massive head trauma, open L forearm fx

## 2019-06-24 NOTE — Progress Notes (Signed)
Back from CT w/o complications

## 2019-06-24 NOTE — ED Notes (Signed)
NO RECTAL TONE ON EXAM, no blood in vault

## 2019-06-24 NOTE — ED Notes (Signed)
MD Day at [facility] preparing for intubation

## 2019-06-24 NOTE — Anesthesia Preprocedure Evaluation (Signed)
Anesthesia Evaluation  Patient identified by MRN, date of birth, ID band Patient unresponsive    Reviewed: Patient's Chart, lab work & pertinent test resultsPreop documentation limited or incomplete due to emergent nature of procedure.  Airway Mallampati: Intubated       Dental   Pulmonary     + decreased breath sounds      Cardiovascular  Rhythm:Regular Rate:Tachycardia     Neuro/Psych    GI/Hepatic   Endo/Other    Renal/GU      Musculoskeletal   Abdominal (+) + obese,   Peds  Hematology   Anesthesia Other Findings   Reproductive/Obstetrics                             Anesthesia Physical Anesthesia Plan  ASA: IV and emergent  Anesthesia Plan: General   Post-op Pain Management:    Induction: Intravenous  PONV Risk Score and Plan: Ondansetron and Dexamethasone  Airway Management Planned: Oral ETT  Additional Equipment: Arterial line and CVP  Intra-op Plan:   Post-operative Plan: Post-operative intubation/ventilation  Informed Consent:   Plan Discussed with: CRNA and Anesthesiologist  Anesthesia Plan Comments:         Anesthesia Quick Evaluation

## 2019-06-24 NOTE — ED Notes (Signed)
MD Lovick at bedside attempting cordis access to L fem
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
## 2019-06-24 NOTE — Progress Notes (Signed)
Chaplain responded to page.  Patient not available. Intubation underway.  No family present that chaplain is aware of.  Will continue to follow. Rev. Tamsen Snider

## 2019-06-24 NOTE — ED Notes (Signed)
C-spine immobilization maintained during intubation

## 2019-06-24 NOTE — ED Provider Notes (Signed)
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 severe abrasions to his left chest and large hematoma to left parietal scalp. He presents unresponsive, hemodynamically labile, unresponsive, decerebrate posturing, irregular respirations with bag valve mask, gcs 3, left arm with open fracture. No medications prior to arrival.  Airway not intact: Obtained intraosseous access. Low dose etomidate and full dose rocuronium for rsi. Intubated urgently for airway protection and likely decompensation.  Bilateral breath sounds present Initial manual blood pressure 70 systolic, called for 1 unit of emergency release blood. Repeat blood pressures improving. Fast exam negative. Trauma attending placed central access.  His blood pressure was initially low and steadily increased becoming severely hypertensive. Suspect increased icp. 3% saline administered, head of bed elevated. Cxr and pelvis unremarkable, tubes/lines in appropriate position. Taken to scanner emergently for pan scans.   He has obvious intracranial bleeding. No c spine injury. He has pulmonary contusion and T1 tp fracture and surprisingly no other chest or abdominal  injury. He remained hypertensive, likely due to elevated icp. Will sedate to treat his icp. Patient to be admitted to the icu and planning for neurosurgical intervention. Patient remained unresponsive and hypertensive and was transferred to the icu.     Final Clinical Impression(s) / ED Diagnoses Final diagnoses:  Fracture  Fracture  Fracture  Respiratory failure (  [facility])    Rx / DC Orders ED Discharge Orders    None       Daisean Brodhead, Lovena Le, MD 06/25/19 0086    Gareth Morgan, MD 06/27/19 1419

## 2019-06-24 NOTE — H&P (Addendum)
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 Respiratory failure - intubated for airway protection due to low GCS, high ventilator settings currently, likely due to an element of aspiration, wean as tolerated, starting with FiO2 to maintain sats >92%. Fentanyl/propofol for sedation.  Left T1 TP fracture - pain control Multiple abrasions - local wound care, rec'd tetanus and ancef in TB Hyperglycemia -  SSI and CBG FEN - NPO, MIVF DVT - SCDs, hold chemical ppx due to clinical condition Dispo - Admit to inpatient--ICU  Family update: both parents present in ED and collaborative discussion held with them and Dr. Ellene Route regarding injuries, clinical condition, need for emergent operative intervention, and plan for admission with expectation of hospital course.   Critical care time: 90 minutes  [name], MD General and [facility]

## 2019-06-24 NOTE — Procedures (Signed)
   Procedure Note  Date: 07/18/2019  Procedure: central venous catheter placement--left, femoral vein, without ultrasound guidance  Pre-op diagnosis: need for IV access  Post-op diagnosis: same  Surgeon: [name], MD  Anesthesia: none EBL: <5cc Drains/Implants: 9Fr cm, single lumen central venous catheter  Description of procedure: This procedure was performed emergently and therefore informed consent was not obtained. The left groin was prepped and draped in the usual sterile fashion. The femoral vein was accessed using anatomic landmarks and an introducer needle and a guidewire passed through the needle. The needle was removed and a skin nick was made. The tract was dilated and the central venous catheter advanced over the guidewire followed by removal of the guidewire. All ports drew blood easily and all were flushed with saline. The catheter was secured to the skin with suture and a sterile dressing. The patient tolerated the procedure well. There were no immediate complications. This procedure was performed emergently and therefore informed consent was not obtained.    Procedure: central venous catheter placement--right, femoral vein, without ultrasound guidance Pre-op diagnosis: need for IV access  Anesthesia: none EBL: <5cc Drains/Implants: triple lumen central venous catheter  The right groin was prepped and draped in the usual sterile fashion. The femoral vein was accessed using anatomic landmarks with an introducer needle and a guidewire passed through the needle. The needle was removed and a skin nick was made. The tract was dilated and the central venous catheter advanced over the guidewire followed by removal of the guidewire. All ports drew blood easily and all were flushed with saline. The catheter was secured to the skin with suture and a sterile dressing. The patient tolerated the procedure well. There were no immediate complications.     Procedure: arterial line  placement--right, radial artery, without ultrasound guidance Pre-op diagnosis: need for invasive blood pressure monitoring Anesthesia: none EBL: <5cc Drains/Implants: 4 cm arterial catheter  Description of procedure: This procedure was performed emergently and therefore informed consent was not obtained. The right wrist was prepped and draped in the usual sterile fashion. The artery was localized using anatomic landmarks and accessed using an arterial catheterization kit after 1 attempt(s). The wire was advanced and the sheath advanced over the wire. The needle and wire were removed with pulsatile, bright red blood noted through the catheter. The catheter was connected to a transducer and a good waveform was noted. The catheter was secured in place with suture and tegaderm. The patient tolerated the procedure well. There were no complications.     [name], MD General and [facility]
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
## 2019-06-24 NOTE — ED Notes (Signed)
7.5 ETT 25@ lip w/ good color change and equal blt breath sounds

## 2019-06-25 ENCOUNTER — Inpatient Hospital Stay (HOSPITAL_COMMUNITY): Payer: Medicaid - Out of State

## 2019-06-25 ENCOUNTER — Encounter: Payer: Self-pay | Admitting: *Deleted

## 2019-06-25 LAB — TYPE AND SCREEN
ABO/RH(D): O POS
Antibody Screen: NEGATIVE
Unit division: 0
Unit division: 0
Unit division: 0
Unit division: 0
Unit division: 0
Unit division: 0
Unit division: 0
Unit division: 0
Unit division: 0
Unit division: 0

## 2019-06-25 LAB — BASIC METABOLIC PANEL
Anion gap: 8 (ref 5–15)
BUN: 7 mg/dL (ref 6–20)
CO2: 20 mmol/L — ABNORMAL LOW (ref 22–32)
Calcium: 7.8 mg/dL — ABNORMAL LOW (ref 8.9–10.3)
Chloride: 113 mmol/L — ABNORMAL HIGH (ref 98–111)
Creatinine, Ser: 0.81 mg/dL (ref 0.61–1.24)
GFR calc Af Amer: >60 mL/min (ref >60)
GFR calc non Af Amer: >60 mL/min (ref >60)
Glucose, Bld: 109 mg/dL — ABNORMAL HIGH (ref 70–99)
Potassium: 4.7 mmol/L (ref 3.5–5.1)
Sodium: 141 mmol/L (ref 135–145)

## 2019-06-25 LAB — POCT I-STAT 7, (LYTES, BLD GAS, ICA,H+H)
Acid-base deficit: 6 mmol/L — ABNORMAL HIGH (ref 0.0–2.0)
Bicarbonate: 23.2 mmol/L (ref 20.0–28.0)
Calcium, Ion: 1.14 mmol/L — ABNORMAL LOW (ref 1.15–1.40)
HCT: 47 % (ref 39.0–52.0)
Hemoglobin: 16 g/dL (ref 13.0–17.0)
O2 Saturation: 95 %
Patient temperature: 35
Potassium: 2.9 mmol/L — ABNORMAL LOW (ref 3.5–5.1)
Sodium: 144 mmol/L (ref 135–145)
TCO2: 25 mmol/L (ref 22–32)
pCO2 arterial: 54.3 mmHg — ABNORMAL HIGH (ref 32.0–48.0)
pH, Arterial: 7.227 — ABNORMAL LOW (ref 7.350–7.450)
pO2, Arterial: 83 mmHg (ref 83.0–108.0)

## 2019-06-25 LAB — GLUCOSE, CAPILLARY
Glucose-Capillary: 100 mg/dL — ABNORMAL HIGH (ref 70–99)
Glucose-Capillary: 109 mg/dL — ABNORMAL HIGH (ref 70–99)
Glucose-Capillary: 111 mg/dL — ABNORMAL HIGH (ref 70–99)
Glucose-Capillary: 114 mg/dL — ABNORMAL HIGH (ref 70–99)
Glucose-Capillary: 128 mg/dL — ABNORMAL HIGH (ref 70–99)
Glucose-Capillary: 136 mg/dL — ABNORMAL HIGH (ref 70–99)
Glucose-Capillary: 87 mg/dL (ref 70–99)

## 2019-06-25 LAB — URINALYSIS, ROUTINE W REFLEX MICROSCOPIC
Bilirubin Urine: NEGATIVE
Glucose, UA: NEGATIVE mg/dL
Hgb urine dipstick: NEGATIVE
Ketones, ur: NEGATIVE mg/dL
Leukocytes,Ua: NEGATIVE
Nitrite: NEGATIVE
Protein, ur: 30 mg/dL — AB
Specific Gravity, Urine: >1.046 — ABNORMAL HIGH (ref 1.005–1.030)
pH: 5 (ref 5.0–8.0)

## 2019-06-25 LAB — BPAM FFP
Blood Product Expiration Date: 202101072359
Blood Product Expiration Date: 202101082359
Blood Product Expiration Date: 202101252359
Blood Product Expiration Date: 202101252359
ISSUE DATE / TIME: 202101041927
ISSUE DATE / TIME: 202101041927
ISSUE DATE / TIME: 202101051517
ISSUE DATE / TIME: 202101051517
Unit Type and Rh: 6200
Unit Type and Rh: 6200
Unit Type and Rh: 6200
Unit Type and Rh: 6200

## 2019-06-25 LAB — BPAM RBC
Blood Product Expiration Date: 202101142359
Blood Product Expiration Date: 202101142359
Blood Product Expiration Date: 202101262359
Blood Product Expiration Date: 202101302359
Blood Product Expiration Date: 202102042359
Blood Product Expiration Date: 202102042359
Blood Product Expiration Date: 202102042359
Blood Product Expiration Date: 202102042359
Blood Product Expiration Date: 202102042359
Blood Product Expiration Date: 202102042359
ISSUE DATE / TIME: 202101041921
ISSUE DATE / TIME: 202101041922
ISSUE DATE / TIME: 202101041923
ISSUE DATE / TIME: 202101042010
ISSUE DATE / TIME: 202101042010
ISSUE DATE / TIME: 202101042010
ISSUE DATE / TIME: 202101042010
ISSUE DATE / TIME: 202101050445
ISSUE DATE / TIME: 202101050649
ISSUE DATE / TIME: 202101051216
Unit Type and Rh: 5100
Unit Type and Rh: 5100
Unit Type and Rh: 5100
Unit Type and Rh: 5100
Unit Type and Rh: 5100
Unit Type and Rh: 5100
Unit Type and Rh: 5100
Unit Type and Rh: 5100
Unit Type and Rh: 9500
Unit Type and Rh: 9500

## 2019-06-25 LAB — PREPARE FRESH FROZEN PLASMA
Unit division: 0
Unit division: 0
Unit division: 0
Unit division: 0

## 2019-06-25 LAB — CBC
HCT: 44.6 % (ref 39.0–52.0)
Hemoglobin: 14.6 g/dL (ref 13.0–17.0)
MCH: 29 pg (ref 26.0–34.0)
MCHC: 32.7 g/dL (ref 30.0–36.0)
MCV: 88.5 fL (ref 80.0–100.0)
Platelets: 235 10*3/uL (ref 150–400)
RBC: 5.04 MIL/uL (ref 4.22–5.81)
RDW: 12.8 % (ref 11.5–15.5)
WBC: 34.7 10*3/uL — ABNORMAL HIGH (ref 4.0–10.5)
nRBC: 0 % (ref 0.0–0.2)

## 2019-06-25 LAB — PREPARE PLATELET PHERESIS: Unit division: 0

## 2019-06-25 LAB — MAGNESIUM
Magnesium: 2 mg/dL (ref 1.7–2.4)
Magnesium: 1.8 mg/dL (ref 1.7–2.4)
Magnesium: 1.9 mg/dL (ref 1.7–2.4)

## 2019-06-25 LAB — HIV ANTIBODY (ROUTINE TESTING W REFLEX): HIV Screen 4th Generation wRfx: NONREACTIVE

## 2019-06-25 LAB — BPAM PLATELET PHERESIS
Blood Product Expiration Date: 202101052359
ISSUE DATE / TIME: 202101042331
Unit Type and Rh: 600

## 2019-06-25 LAB — ABO/RH: ABO/RH(D): O POS

## 2019-06-25 LAB — LACTIC ACID, PLASMA: Lactic Acid, Venous: 3.2 mmol/L (ref 0.5–1.9)

## 2019-06-25 LAB — PHOSPHORUS
Phosphorus: 2.8 mg/dL (ref 2.5–4.6)
Phosphorus: 2.9 mg/dL (ref 2.5–4.6)
Phosphorus: 4 mg/dL (ref 2.5–4.6)

## 2019-06-25 LAB — TRIGLYCERIDES: Triglycerides: 164 mg/dL — ABNORMAL HIGH (ref <150)

## 2019-06-25 LAB — MRSA PCR SCREENING: MRSA by PCR: NEGATIVE

## 2019-06-25 MED ORDER — ASCORBIC ACID 500 MG PO TABS
1000.0000 mg | ORAL_TABLET | Freq: Three times a day (TID) | ORAL | Status: AC
  Administered 2019-06-25 – 2019-07-01 (×21): 1000 mg
  Filled 2019-06-25 (×21): qty 2

## 2019-06-25 MED ORDER — PIVOT 1.5 CAL PO LIQD
1000.0000 mL | ORAL | Status: DC
  Administered 2019-06-25 – 2019-07-11 (×8): 1000 mL
  Filled 2019-06-25 (×4): qty 1000

## 2019-06-25 MED ORDER — LABETALOL HCL 5 MG/ML IV SOLN: 10.0000 mg | INTRAVENOUS | Status: DC | PRN

## 2019-06-25 MED ORDER — SODIUM CHLORIDE 0.9 % IV BOLUS
1000.0000 mL | Freq: Once | INTRAVENOUS | Status: AC
  Administered 2019-06-25: 1000 mL via INTRAVENOUS

## 2019-06-25 MED ORDER — ORAL CARE MOUTH RINSE
15.0000 mL | OROMUCOSAL | Status: DC
  Administered 2019-06-25 – 2019-09-11 (×778): 15 mL via OROMUCOSAL

## 2019-06-25 MED ORDER — METOPROLOL TARTRATE 5 MG/5ML IV SOLN
2.5000 mg | INTRAVENOUS | Status: DC | PRN
  Administered 2019-06-25 (×4): 2.5 mg via INTRAVENOUS
  Filled 2019-06-25 (×4): qty 5

## 2019-06-25 MED ORDER — PROMETHAZINE HCL 25 MG PO TABS: 12.5000 mg | ORAL_TABLET | ORAL | Status: DC | PRN

## 2019-06-25 MED ORDER — SODIUM CHLORIDE 0.9% FLUSH
10.0000 mL | INTRAVENOUS | Status: DC | PRN
  Administered 2019-06-26: 11:00:00 10 mL

## 2019-06-25 MED ORDER — PRO-STAT SUGAR FREE PO LIQD
30.0000 mL | Freq: Three times a day (TID) | ORAL | Status: DC
  Administered 2019-06-25 – 2019-07-16 (×61): 30 mL
  Filled 2019-06-25 (×62): qty 30

## 2019-06-25 MED ORDER — HYDRALAZINE HCL 20 MG/ML IJ SOLN
5.0000 mg | INTRAMUSCULAR | Status: DC | PRN
  Administered 2019-06-25 (×2): 5 mg via INTRAVENOUS
  Filled 2019-06-25 (×2): qty 1

## 2019-06-25 MED ORDER — SODIUM CHLORIDE 0.9% FLUSH
10.0000 mL | Freq: Two times a day (BID) | INTRAVENOUS | Status: DC
  Administered 2019-06-26 – 2019-06-27 (×3): 10 mL
  Administered 2019-06-29: 40 mL
  Administered 2019-06-29: 10 mL
  Administered 2019-06-30: 40 mL
  Administered 2019-07-03: 30 mL
  Administered 2019-07-04: 20 mL
  Administered 2019-07-05 – 2019-07-08 (×5): 10 mL
  Administered 2019-07-08: 23:00:00 30 mL
  Administered 2019-07-09: 22:00:00 40 mL
  Administered 2019-07-09 – 2019-07-11 (×4): 10 mL

## 2019-06-25 MED ORDER — SELENIUM 200 MCG PO TABS
200.0000 ug | ORAL_TABLET | Freq: Every day | ORAL | Status: AC
  Administered 2019-06-25 – 2019-07-01 (×7): 200 ug
  Filled 2019-06-25 (×7): qty 1

## 2019-06-25 MED ORDER — TRANEXAMIC ACID 1000 MG/10ML IV SOLN
1000.0000 mg | INTRAVENOUS | Status: AC
  Administered 2019-06-25: 1000 mg via INTRAVENOUS
  Filled 2019-06-25: qty 10

## 2019-06-25 MED ORDER — PANTOPRAZOLE SODIUM 40 MG IV SOLR
40.0000 mg | Freq: Every day | INTRAVENOUS | Status: DC
  Administered 2019-06-25: 40 mg via INTRAVENOUS
  Filled 2019-06-25: qty 40

## 2019-06-25 MED ORDER — TRANEXAMIC ACID-NACL 1000-0.7 MG/100ML-% IV SOLN: 1000.0000 mg | Freq: Once | INTRAVENOUS | Status: DC

## 2019-06-25 MED ORDER — DOCUSATE SODIUM 100 MG PO CAPS: 100.0000 mg | ORAL_CAPSULE | Freq: Two times a day (BID) | ORAL | Status: DC

## 2019-06-25 MED ORDER — SENNOSIDES 8.8 MG/5ML PO SYRP: 8.8000 mg | ORAL_SOLUTION | Freq: Two times a day (BID) | ORAL | Status: DC

## 2019-06-25 MED ORDER — ONDANSETRON HCL 4 MG/2ML IJ SOLN: 4.0000 mg | INTRAMUSCULAR | Status: DC | PRN

## 2019-06-25 MED ORDER — BISACODYL 10 MG RE SUPP: 10.0000 mg | Freq: Every day | RECTAL | Status: DC | PRN

## 2019-06-25 MED ORDER — TRANEXAMIC ACID 1000 MG/10ML IV SOLN: 1000.0000 mg | Freq: Once | INTRAVENOUS | Status: DC

## 2019-06-25 MED ORDER — VANCOMYCIN HCL 1500 MG/300ML IV SOLN
1500.0000 mg | Freq: Two times a day (BID) | INTRAVENOUS | Status: AC
  Administered 2019-06-25 (×2): 1500 mg via INTRAVENOUS
  Filled 2019-06-25 (×2): qty 300

## 2019-06-25 MED ORDER — CHLORHEXIDINE GLUCONATE CLOTH 2 % EX PADS
6.0000 | MEDICATED_PAD | Freq: Every day | CUTANEOUS | Status: DC
  Administered 2019-06-26 – 2019-09-11 (×66): 6 via TOPICAL

## 2019-06-25 MED ORDER — VITAL HIGH PROTEIN PO LIQD
1000.0000 mL | ORAL | Status: DC
  Administered 2019-06-25: 1000 mL

## 2019-06-25 MED ORDER — PRO-STAT SUGAR FREE PO LIQD
30.0000 mL | Freq: Two times a day (BID) | ORAL | Status: DC
  Administered 2019-06-25: 30 mL
  Filled 2019-06-25: qty 30

## 2019-06-25 MED ORDER — CHLORHEXIDINE GLUCONATE 0.12% ORAL RINSE (MEDLINE KIT)
15.0000 mL | Freq: Two times a day (BID) | OROMUCOSAL | Status: DC
  Administered 2019-06-25 – 2019-09-11 (×158): 15 mL via OROMUCOSAL

## 2019-06-25 MED ORDER — LACTATED RINGERS IV BOLUS: 1000.0000 mL | Freq: Once | INTRAVENOUS | Status: DC

## 2019-06-25 MED ORDER — CHLORHEXIDINE GLUCONATE CLOTH 2 % EX PADS: 6.0000 | MEDICATED_PAD | Freq: Every day | CUTANEOUS | Status: DC

## 2019-06-25 MED ORDER — ONDANSETRON HCL 4 MG PO TABS: 4.0000 mg | ORAL_TABLET | ORAL | Status: DC | PRN

## 2019-06-25 MED ORDER — FLEET ENEMA 7-19 GM/118ML RE ENEM: 1.0000 | ENEMA | Freq: Once | RECTAL | Status: DC | PRN

## 2019-06-25 MED ORDER — POLYETHYLENE GLYCOL 3350 17 G PO PACK: 17.0000 g | PACK | Freq: Every day | ORAL | Status: DC | PRN

## 2019-06-25 MED ORDER — LEVETIRACETAM IN NACL 500 MG/100ML IV SOLN
500.0000 mg | Freq: Two times a day (BID) | INTRAVENOUS | Status: DC
  Administered 2019-06-25 – 2019-07-19 (×49): 500 mg via INTRAVENOUS
  Filled 2019-06-25 (×49): qty 100
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
## 2019-06-25 NOTE — Progress Notes (Signed)
Patient ID: Jay Cole, male   DOB: [birth year], 24 y.o.   MRN: [id number] I met with Jay Cole's mother at the bedside and updated her on his condition and the plan of care. [name] lives with her. He has a HX of Bipolar disorder and was being evaluated for possible DM and HTN by his primary care physician. I answered her questions.  Georganna Skeans, MD, MPH, FACS Please use [email or web address] to contact on call provider

## 2019-06-25 NOTE — Anesthesia Postprocedure Evaluation (Signed)
Anesthesia Post Note  Patient: Jay Cole  Procedure(s) Performed: CRANIOTOMY FOR EVACUATION OF HEMATOMA WITH PLACEMENT OF BONE FLAP INTO ABDOMEN (Left Head)     Patient location during evaluation: NICU Anesthesia Type: General Level of consciousness: sedated and patient remains intubated per anesthesia plan Pain management: pain level controlled Vital Signs Assessment: post-procedure vital signs reviewed and stable Respiratory status: patient remains intubated per anesthesia plan and patient on ventilator - see flowsheet for VS Cardiovascular status: stable and tachycardic Anesthetic complications: no    Last Vitals:  Vitals:   06/22/2019 1912 07/19/2019 1923  BP: (!) 192/159 (!) 170/100  Pulse: 99   Resp: 20 (!) 35  SpO2: (!) 80%     Last Pain: There were no vitals filed for this visit.               Maty Zeisler COKER

## 2019-06-25 NOTE — Progress Notes (Signed)
Patient ID: Jay Cole, male   DOB: 1995-07-20, 24 y.o.   MRN: [id number] Follow up - Trauma Critical Care  Patient Details:    Jay Cole is an 24 y.o. male.  Lines/tubes : Airway 7.5 mm (Active)  Secured at (cm) 25 cm 06/25/19 0346  Measured From Lips 06/25/19 0346  Secured Location Right 06/25/19 0346  Secured By [employer] 06/25/19 0346  Tube Holder Repositioned Yes 06/25/19 0346  Cuff Pressure (cm H2O) 30 cm H2O 06/25/19 0346  Site Condition Dry 06/25/19 0346     CVC Single Lumen 06/23/2019 Left Femoral (Active)  Indication for Insertion or Continuance of Line Vasoactive infusions 06/25/19 0740  Site Assessment Clean;Dry;Intact 06/25/19 0100  Line Status Flushed;Saline locked 06/25/19 0100  Dressing Type Transparent;Occlusive 06/25/19 0100  Dressing Status Clean;Dry;Intact;Antimicrobial disc in place 06/25/19 0100  Line Care Connections checked and tightened 06/25/19 0100  Dressing Change Due 07/01/19 06/25/19 0100     CVC Triple Lumen 07/03/2019 Right Femoral (Active)  Indication for Insertion or Continuance of Line Vasoactive infusions 06/25/19 0100  Site Assessment Clean;Dry;Intact 06/25/19 0100  Proximal Lumen Status Infusing 06/25/19 0100  Medial Lumen Status Infusing 06/25/19 0100  Distal Lumen Status Infusing 06/25/19 0100  Dressing Type Transparent;Occlusive 06/25/19 0100  Dressing Status Clean;Dry;Intact;Antimicrobial disc in place 06/25/19 0100  Line Care Connections checked and tightened 06/25/19 0100  Dressing Change Due 07/01/19 06/25/19 0100     Closed System Drain Left Scalp Bulb (JP) (Active)  Output (mL) 130 mL 06/25/19 0600     NG/OG Tube Orogastric 18 Fr. Center mouth Xray (Active)  External Length of Tube (cm) - (if applicable) 69 cm 123456 0100  Site Assessment Clean;Dry;Intact 06/25/19 0100  Ongoing Placement Verification No change in cm markings or external length of tube from initial placement;No change in respiratory status;No  acute changes, not attributed to clinical condition 06/25/19 0100  Status Clamped 06/25/19 0100     Urethral Catheter Jay Laurence MD Temperature probe 16 Fr. (Active)  Indication for Insertion or Continuance of Catheter Unstable critically ill patients first 24-48 hours (See Criteria) 06/25/19 0025  Site Assessment Clean;Intact 06/25/19 0025  Catheter Maintenance Bag below level of bladder;Catheter secured;Drainage bag/tubing not touching floor;No dependent loops;Seal intact;Insertion date on drainage bag 06/25/19 0025  Collection Container Standard drainage bag 06/25/19 0025  Securement Method Securing device (Describe) 06/25/19 0025  Urinary Catheter Interventions (if applicable) Unclamped 123456 0025  Output (mL) 450 mL 06/25/19 0600    Microbiology/Sepsis markers: Results for orders placed or performed during the hospital encounter of 07/20/2019  Respiratory Panel by RT PCR (Flu A&B, Covid) - Nasopharyngeal Swab     Status: None   Collection Time: 06/21/2019  8:36 PM   Specimen: Nasopharyngeal Swab  Result Value Ref Range Status   SARS Coronavirus 2 by RT PCR NEGATIVE NEGATIVE Final    Comment: (NOTE) SARS-CoV-2 target nucleic acids are NOT DETECTED. The SARS-CoV-2 RNA is generally detectable in upper respiratoy specimens during the acute phase of infection. The lowest concentration of SARS-CoV-2 viral copies this assay can detect is 131 copies/mL. A negative result does not preclude SARS-Cov-2 infection and should not be used as the sole basis for treatment or other patient management decisions. A negative result may occur with  improper specimen collection/handling, submission of specimen other than nasopharyngeal swab, presence of viral mutation(s) within the areas targeted by this assay, and inadequate number of viral copies (<131 copies/mL). A negative result must be combined with clinical observations, patient history,  and epidemiological information. The expected result is  Negative. Fact Sheet for Patients:  PinkCheek.be Fact Sheet for Healthcare Providers:  GravelBags.it This test is not yet ap proved or cleared by the [place] FDA and  has been authorized for detection and/or diagnosis of SARS-CoV-2 by FDA under an Emergency Use Authorization (EUA). This EUA will remain  in effect (meaning this test can be used) for the duration of the COVID-19 declaration under Section 564(b)(1) of the Act, 21 U.S.C. section 360bbb-3(b)(1), unless the authorization is terminated or revoked sooner.    Influenza A by PCR NEGATIVE NEGATIVE Final   Influenza B by PCR NEGATIVE NEGATIVE Final    Comment: (NOTE) The Xpert Xpress SARS-CoV-2/FLU/RSV assay is intended as an aid in  the diagnosis of influenza from Nasopharyngeal swab specimens and  should not be used as a sole basis for treatment. Nasal washings and  aspirates are unacceptable for Xpert Xpress SARS-CoV-2/FLU/RSV  testing. Fact Sheet for Patients: PinkCheek.be Fact Sheet for Healthcare Providers: GravelBags.it This test is not yet approved or cleared by the [place] FDA and  has been authorized for detection and/or diagnosis of SARS-CoV-2 by  FDA under an Emergency Use Authorization (EUA). This EUA will remain  in effect (meaning this test can be used) for the duration of the  Covid-19 declaration under Section 564(b)(1) of the Act, 21  U.S.C. section 360bbb-3(b)(1), unless the authorization is  terminated or revoked. Performed at [facility], [place] [address], [place], [place] [address]   MRSA PCR Screening     Status: None   Collection Time: 06/25/19 12:50 AM   Specimen: Nasal Mucosa; Nasopharyngeal  Result Value Ref Range Status   MRSA by PCR NEGATIVE NEGATIVE Final    Comment:        The GeneXpert MRSA Assay (FDA approved for NASAL specimens only), is one component of  a comprehensive MRSA colonization surveillance program. It is not intended to diagnose MRSA infection nor to guide or monitor treatment for MRSA infections. Performed at [facility], [place] [address] [place], [place] [address]     Anti-infectives:  Anti-infectives (From admission, onward)   Start     Dose/Rate Route Frequency Ordered Stop   06/25/19 0400  vancomycin (VANCOREADY) IVPB 1500 mg/300 mL     1,500 mg 150 mL/hr over 120 Minutes Intravenous Every 12 hours 06/25/19 0121 06/26/19 0359   06/29/2019 2228  bacitracin 50,000 Units in sodium chloride 0.9 % 500 mL irrigation  Status:  Discontinued       As needed 07/01/2019 2229 06/25/19 0005   06/23/2019 2000  ceFAZolin (ANCEF) 3 g in dextrose 5 % 50 mL IVPB     3 g 100 mL/hr over 30 Minutes Intravenous  Once 07/08/2019 1947 06/29/2019 2031      Best Practice/Protocols:  VTE Prophylaxis: Mechanical Continous Sedation  Consults: Treatment Team:  Marchia Bond, MD    Studies:    Events:  Subjective:    Overnight Issues:   Objective:  Vital signs for last 24 hours: Temp:  [96.8 F (36 C)-100.2 F (37.9 C)] 100.2 F (37.9 C) (01/05 0700) Pulse Rate:  [62-110] 98 (01/05 0700) Resp:  [20-40] 28 (01/05 0700) BP: (72-195)/(40-159) 127/73 (01/05 0700) SpO2:  [80 %-100 %] 100 % (01/05 0700) Arterial Line BP: (152-197)/(54-88) 161/68 (01/05 0700) FiO2 (%):  [50 %-100 %] 50 % (01/05 0521) Weight:  [85 kg-113.4 kg] 113.4 kg (01/04 2254)  Hemodynamic parameters for last 24 hours:  Intake/Output from previous day: 01/04 0701 - 01/05 0700 In: 4587.1 [I.V.:3302.3; IV Piggyback:1284.8] Out: 1075 [Urine:795; Drains:130; Blood:150]  Intake/Output this shift: No intake/output data recorded.  Vent settings for last 24 hours: Vent Mode: PRVC FiO2 (%):  [50 %-100 %] 50 % Set Rate:  [20 bmp-30 bmp] 30 bmp Vt Set:  [390 mL-540 mL] 390 mL PEEP:  [8 cmH20-12 cmH20] 10 cmH20 Plateau Pressure:  [18 cmH20-25 cmH20] 21  cmH20  Physical Exam:  General: on vent Neuro: pupils 87mm, no movement to pain HEENT/Neck: ETT and crani dressing Resp: clear to auscultation bilaterally CVS: RRR GI: soft, NT, large L abrasion Extremities: LUE abrasions  Results for orders placed or performed during the hospital encounter of 07/09/2019 (from the past 24 hour(s))  CDS serology     Status: None   Collection Time: 07/12/2019  7:23 PM  Result Value Ref Range   CDS serology specimen      SPECIMEN WILL BE HELD FOR 14 DAYS IF TESTING IS REQUIRED  Comprehensive metabolic panel     Status: Abnormal   Collection Time: 06/26/2019  7:23 PM  Result Value Ref Range   Sodium 141 135 - 145 mmol/L   Potassium 3.5 3.5 - 5.1 mmol/L   Chloride 104 98 - 111 mmol/L   CO2 20 (L) 22 - 32 mmol/L   Glucose, Bld 182 (H) 70 - 99 mg/dL   BUN 7 6 - 20 mg/dL   Creatinine, Ser 1.22 0.61 - 1.24 mg/dL   Calcium 8.7 (L) 8.9 - 10.3 mg/dL   Total Protein 6.6 6.5 - 8.1 g/dL   Albumin 3.9 3.5 - 5.0 g/dL   AST 88 (H) 15 - 41 U/L   ALT 127 (H) 0 - 44 U/L   Alkaline Phosphatase 106 38 - 126 U/L   Total Bilirubin 0.5 0.3 - 1.2 mg/dL   GFR calc non Af Amer >60 >60 mL/min   GFR calc Af Amer >60 >60 mL/min   Anion gap 17 (H) 5 - 15  CBC     Status: Abnormal   Collection Time: 07/06/2019  7:23 PM  Result Value Ref Range   WBC 20.5 (H) 4.0 - 10.5 K/uL   RBC 5.84 (H) 4.22 - 5.81 MIL/uL   Hemoglobin 17.0 13.0 - 17.0 g/dL   HCT 54.0 (H) 39.0 - 52.0 %   MCV 92.5 80.0 - 100.0 fL   MCH 29.1 26.0 - 34.0 pg   MCHC 31.5 30.0 - 36.0 g/dL   RDW 12.4 11.5 - 15.5 %   Platelets 345 150 - 400 K/uL   nRBC 0.0 0.0 - 0.2 %  Ethanol     Status: None   Collection Time: 07/09/2019  7:23 PM  Result Value Ref Range   Alcohol, Ethyl (B) <10 <10 mg/dL  Protime-INR     Status: None   Collection Time: 06/21/2019  7:23 PM  Result Value Ref Range   Prothrombin Time 14.2 11.4 - 15.2 seconds   INR 1.1 0.8 - 1.2  Type and screen Ordered by PROVIDER DEFAULT     Status: None    Collection Time: 07/01/2019  7:23 PM  Result Value Ref Range   ABO/RH(D) O POS    Antibody Screen NEG    Sample Expiration 06/27/2019,2359    Unit Number [id number]    Blood Component Type RBC LR PHER1    Unit division 00    Status of Unit REL FROM [facility]    Unit tag comment EMERGENCY RELEASE  Transfusion Status OK TO TRANSFUSE    Crossmatch Result PENDING    Unit Number [id number]    Blood Component Type RED CELLS,LR    Unit division 00    Status of Unit REL FROM [facility]    Unit tag comment EMERGENCY RELEASE    Transfusion Status OK TO TRANSFUSE    Crossmatch Result PENDING    Unit Number [id number]    Blood Component Type RED CELLS,LR    Unit division 00    Status of Unit REL FROM [facility]    Unit tag comment EMERGENCY RELEASE    Transfusion Status OK TO TRANSFUSE    Crossmatch Result PENDING    Unit Number [id number]    Blood Component Type RED CELLS,LR    Unit division 00    Status of Unit REL FROM [facility]    Unit tag comment EMERGENCY RELEASE    Transfusion Status OK TO TRANSFUSE    Crossmatch Result PENDING    Unit Number [id number]    Blood Component Type RED CELLS,LR    Unit division 00    Status of Unit REL FROM [facility]    Transfusion Status OK TO TRANSFUSE    Crossmatch Result Compatible    Unit Number [id number]    Blood Component Type RED CELLS,LR    Unit division 00    Status of Unit REL FROM [facility]    Transfusion Status OK TO TRANSFUSE    Crossmatch Result Compatible    Unit Number [id number]    Blood Component Type RED CELLS,LR    Unit division 00    Status of Unit REL FROM [facility]    Transfusion Status OK TO TRANSFUSE    Crossmatch Result      Compatible Performed at [facility], [place] [address], [place], [place] [address]    Unit Number [id number]    Blood Component Type RED CELLS,LR    Unit division 00    Status of Unit REL FROM [facility]    Transfusion Status OK TO TRANSFUSE    Crossmatch Result Compatible   ABO/Rh      Status: None (Preliminary result)   Collection Time: 07/18/2019  7:23 PM  Result Value Ref Range   ABO/RH(D)      O POS Performed at [facility], [place] [address], [place], [place] [address]   I-stat chem 8, ED     Status: Abnormal   Collection Time: 07/07/2019  7:33 PM  Result Value Ref Range   Sodium 143 135 - 145 mmol/L   Potassium 3.3 (L) 3.5 - 5.1 mmol/L   Chloride 104 98 - 111 mmol/L   BUN 6 6 - 20 mg/dL   Creatinine, Ser 1.10 0.61 - 1.24 mg/dL   Glucose, Bld 169 (H) 70 - 99 mg/dL   Calcium, Ion 1.11 (L) 1.15 - 1.40 mmol/L   TCO2 23 22 - 32 mmol/L   Hemoglobin 17.7 (H) 13.0 - 17.0 g/dL   HCT 52.0 39.0 - 52.0 %  I-STAT 7, (LYTES, BLD GAS, ICA, H+H)     Status: Abnormal   Collection Time: 06/28/2019  8:12 PM  Result Value Ref Range   pH, Arterial 7.202 (L) 7.350 - 7.450   pCO2 arterial 53.0 (H) 32.0 - 48.0 mmHg   pO2, Arterial 76.0 (L) 83.0 - 108.0 mmHg   Bicarbonate 20.8 20.0 - 28.0 mmol/L   TCO2 22 22 - 32 mmol/L   O2 Saturation 91.0 %   Acid-base deficit 8.0 (H) 0.0 -  2.0 mmol/L   Sodium 139 135 - 145 mmol/L   Potassium 3.2 (L) 3.5 - 5.1 mmol/L   Calcium, Ion 1.22 1.15 - 1.40 mmol/L   HCT 47.0 39.0 - 52.0 %   Hemoglobin 16.0 13.0 - 17.0 g/dL   Patient temperature HIDE    Sample type ARTERIAL   Respiratory Panel by RT PCR (Flu A&B, Covid) - Nasopharyngeal Swab     Status: None   Collection Time: 07/17/2019  8:36 PM   Specimen: Nasopharyngeal Swab  Result Value Ref Range   SARS Coronavirus 2 by RT PCR NEGATIVE NEGATIVE   Influenza A by PCR NEGATIVE NEGATIVE   Influenza B by PCR NEGATIVE NEGATIVE  Glucose, capillary     Status: Abnormal   Collection Time: 06/25/19 12:45 AM  Result Value Ref Range   Glucose-Capillary 136 (H) 70 - 99 mg/dL  Urinalysis, Routine w reflex microscopic     Status: Abnormal   Collection Time: 06/25/19 12:47 AM  Result Value Ref Range   Color, Urine YELLOW YELLOW   APPearance HAZY (A) CLEAR   Specific Gravity, Urine >1.046 (H) 1.005 -  1.030   pH 5.0 5.0 - 8.0   Glucose, UA NEGATIVE NEGATIVE mg/dL   Hgb urine dipstick NEGATIVE NEGATIVE   Bilirubin Urine NEGATIVE NEGATIVE   Ketones, ur NEGATIVE NEGATIVE mg/dL   Protein, ur 30 (A) NEGATIVE mg/dL   Nitrite NEGATIVE NEGATIVE   Leukocytes,Ua NEGATIVE NEGATIVE   RBC / HPF 6-10 0 - 5 RBC/hpf   WBC, UA 11-20 0 - 5 WBC/hpf   Bacteria, UA RARE (A) NONE SEEN   Squamous Epithelial / LPF 0-5 0 - 5   Mucus PRESENT    Non Squamous Epithelial 0-5 (A) NONE SEEN  Lactic acid, plasma     Status: Abnormal   Collection Time: 06/25/19 12:47 AM  Result Value Ref Range   Lactic Acid, Venous 3.2 (HH) 0.5 - 1.9 mmol/L  HIV Antibody (routine testing w rflx)     Status: None   Collection Time: 06/25/19 12:47 AM  Result Value Ref Range   HIV Screen 4th Generation wRfx NON REACTIVE NON REACTIVE  Triglycerides     Status: Abnormal   Collection Time: 06/25/19 12:47 AM  Result Value Ref Range   Triglycerides 164 (H) <150 mg/dL  MRSA PCR Screening     Status: None   Collection Time: 06/25/19 12:50 AM   Specimen: Nasal Mucosa; Nasopharyngeal  Result Value Ref Range   MRSA by PCR NEGATIVE NEGATIVE  Glucose, capillary     Status: Abnormal   Collection Time: 06/25/19  3:36 AM  Result Value Ref Range   Glucose-Capillary 128 (H) 70 - 99 mg/dL  CBC     Status: Abnormal   Collection Time: 06/25/19  6:08 AM  Result Value Ref Range   WBC 34.7 (H) 4.0 - 10.5 K/uL   RBC 5.04 4.22 - 5.81 MIL/uL   Hemoglobin 14.6 13.0 - 17.0 g/dL   HCT 44.6 39.0 - 52.0 %   MCV 88.5 80.0 - 100.0 fL   MCH 29.0 26.0 - 34.0 pg   MCHC 32.7 30.0 - 36.0 g/dL   RDW 12.8 11.5 - 15.5 %   Platelets 235 150 - 400 K/uL   nRBC 0.0 0.0 - 0.2 %    Assessment & Plan: Present on Admission: . Epidural hematoma ([place])    LOS: 1 day   Additional comments:I reviewed the patient's new clinical lab test results. Marland Kitchen PHBC TBI/L SDH - S/P decompressive  craniectomy by Dr. Ellene Route 1/4, follow exam Acute hypoxic ventilator dependent  respiratory failure - improving, wean FiO2 and PEEP some more today L elbow chip FX - per Dr. Mardelle Matte Multiple abrasions L1 TVP FX HTN - PRNs for now FEN - start TF protocol, labs pending now VTE - PAS Dispo - ICU I will speak with his mother when she comes in later today.  Critical Care Total Time*: 34 Minutes  Georganna Skeans, MD, MPH, FACS Trauma & General Surgery Use [email or web address] to contact on call provider  06/25/2019  *Care during the described time interval was provided by me. I have reviewed this patient's available data, including medical history, events of note, physical examination and test results as part of my evaluation.

## 2019-06-25 NOTE — Transfer of Care (Signed)
Immediate Anesthesia Transfer of Care Note  Patient: Jay Cole  Procedure(s) Performed: CRANIOTOMY FOR EVACUATION OF HEMATOMA WITH PLACEMENT OF BONE FLAP INTO ABDOMEN (Left Head)  Patient Location: ICU  Anesthesia Type:General  Level of Consciousness: sedated, patient cooperative and Patient remains intubated per anesthesia plan  Airway & Oxygen Therapy: Patient remains intubated per anesthesia plan and Patient placed on Ventilator (see vital sign flow sheet for setting)  Post-op Assessment: Report given to RN and Post -op Vital signs reviewed and stable  Post vital signs: Reviewed and stable  Last Vitals:  Vitals Value Taken Time  BP 117/72 06/25/19 0016  Temp 35.9 C 06/25/19 0020  Pulse 95 06/25/19 0020  Resp 30 06/25/19 0020  SpO2 100 % 06/25/19 0020  Vitals shown include unvalidated device data.  Last Pain: There were no vitals filed for this visit.       Complications: No apparent anesthesia complications

## 2019-06-25 NOTE — Progress Notes (Signed)
Patient's mother has arrived. She was updated and all questions were answered. MD aware so he can speak with her. She was given pt's broken cell-phone, cut clothing, one shoe, and wallet with a $25 [employer] giftcard in it. Herbie Lehrmann, Rande Brunt, RN

## 2019-06-25 NOTE — Progress Notes (Addendum)
ABG Results for 06/25/2019 at 0145  PH: 7.29 PCO2: 43.9 PO2: 263 Bicarb: 21.2
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
## 2019-06-25 NOTE — Progress Notes (Signed)
RT note: patient does not meet SBT criteria for this AM due to PEEP set at 10.  Tolerating current ventilator settings well at this time.  Will continue to monitor.

## 2019-06-25 NOTE — Progress Notes (Addendum)
ABG results not showing up in Epic from [place] machine.   ABG results from 06/25/2019 at 0352:  Ph: 7.25 PCO2: 47.5 PO2: 110 Bicarb: 20.5 97%.

## 2019-06-25 NOTE — Progress Notes (Signed)
Patient ID: Jay Cole, male   DOB: [birth year], 24 y.o.   MRN: [id number] Postop pupils are small 2 mm and nonreactive patient does not respond to deep painful stimulus.  His flap appears pulsatile there is been drainage of approximately 75 cc from the JP drain since the surgery.  Continue to observe him overnight we will repeat the CT scan in the morning.  Attempted to contact parents but no contact was able to be made phone answered to a voicemail machine

## 2019-06-25 NOTE — Progress Notes (Signed)
Patient transported to CT and back to [id number] without complications.

## 2019-06-26 ENCOUNTER — Inpatient Hospital Stay (HOSPITAL_COMMUNITY): Payer: Medicaid - Out of State

## 2019-06-26 ENCOUNTER — Inpatient Hospital Stay: Payer: Self-pay

## 2019-06-26 LAB — POCT I-STAT 7, (LYTES, BLD GAS, ICA,H+H)
Acid-Base Excess: 2 mmol/L (ref 0.0–2.0)
Acid-base deficit: 7 mmol/L — ABNORMAL HIGH (ref 0.0–2.0)
Bicarbonate: 20.5 mmol/L (ref 20.0–28.0)
Bicarbonate: 27.8 mmol/L (ref 20.0–28.0)
Calcium, Ion: 1.2 mmol/L (ref 1.15–1.40)
Calcium, Ion: 1.17 mmol/L (ref 1.15–1.40)
HCT: 43 % (ref 39.0–52.0)
HCT: 33 % — ABNORMAL LOW (ref 39.0–52.0)
Hemoglobin: 14.6 g/dL (ref 13.0–17.0)
Hemoglobin: 11.2 g/dL — ABNORMAL LOW (ref 13.0–17.0)
O2 Saturation: 97 %
O2 Saturation: 89 %
Patient temperature: 37.4
Patient temperature: 38.4
Potassium: 4.3 mmol/L (ref 3.5–5.1)
Potassium: 3.9 mmol/L (ref 3.5–5.1)
Sodium: 141 mmol/L (ref 135–145)
Sodium: 142 mmol/L (ref 135–145)
TCO2: 22 mmol/L (ref 22–32)
TCO2: 29 mmol/L (ref 22–32)
pCO2 arterial: 48.4 mmHg — ABNORMAL HIGH (ref 32.0–48.0)
pCO2 arterial: 49.3 mmHg — ABNORMAL HIGH (ref 32.0–48.0)
pH, Arterial: 7.238 — ABNORMAL LOW (ref 7.350–7.450)
pH, Arterial: 7.365 (ref 7.350–7.450)
pO2, Arterial: 112 mmHg — ABNORMAL HIGH (ref 83.0–108.0)
pO2, Arterial: 65 mmHg — ABNORMAL LOW (ref 83.0–108.0)

## 2019-06-26 LAB — CBC
HCT: 37.6 % — ABNORMAL LOW (ref 39.0–52.0)
Hemoglobin: 12.2 g/dL — ABNORMAL LOW (ref 13.0–17.0)
MCH: 29.1 pg (ref 26.0–34.0)
MCHC: 32.4 g/dL (ref 30.0–36.0)
MCV: 89.7 fL (ref 80.0–100.0)
Platelets: 176 10*3/uL (ref 150–400)
RBC: 4.19 MIL/uL — ABNORMAL LOW (ref 4.22–5.81)
RDW: 13.2 % (ref 11.5–15.5)
WBC: 18.8 10*3/uL — ABNORMAL HIGH (ref 4.0–10.5)
nRBC: 0 % (ref 0.0–0.2)

## 2019-06-26 LAB — BASIC METABOLIC PANEL
Anion gap: 8 (ref 5–15)
BUN: 7 mg/dL (ref 6–20)
CO2: 28 mmol/L (ref 22–32)
Calcium: 7.9 mg/dL — ABNORMAL LOW (ref 8.9–10.3)
Chloride: 107 mmol/L (ref 98–111)
Creatinine, Ser: 0.64 mg/dL (ref 0.61–1.24)
GFR calc Af Amer: >60 mL/min (ref >60)
GFR calc non Af Amer: >60 mL/min (ref >60)
Glucose, Bld: 120 mg/dL — ABNORMAL HIGH (ref 70–99)
Potassium: 3.9 mmol/L (ref 3.5–5.1)
Sodium: 143 mmol/L (ref 135–145)

## 2019-06-26 LAB — GLUCOSE, CAPILLARY
Glucose-Capillary: 97 mg/dL (ref 70–99)
Glucose-Capillary: 127 mg/dL — ABNORMAL HIGH (ref 70–99)
Glucose-Capillary: 119 mg/dL — ABNORMAL HIGH (ref 70–99)
Glucose-Capillary: 110 mg/dL — ABNORMAL HIGH (ref 70–99)
Glucose-Capillary: 116 mg/dL — ABNORMAL HIGH (ref 70–99)

## 2019-06-26 LAB — MAGNESIUM: Magnesium: 1.9 mg/dL (ref 1.7–2.4)

## 2019-06-26 LAB — PHOSPHORUS: Phosphorus: 2.2 mg/dL — ABNORMAL LOW (ref 2.5–4.6)

## 2019-06-26 LAB — BLOOD PRODUCT ORDER (VERBAL) VERIFICATION

## 2019-06-26 LAB — HEMOGLOBIN A1C
Hgb A1c MFr Bld: 5.9 % — ABNORMAL HIGH (ref 4.8–5.6)
Mean Plasma Glucose: 123 mg/dL

## 2019-06-26 MED ORDER — MAGNESIUM SULFATE IN D5W 1-5 GM/100ML-% IV SOLN
1.0000 g | Freq: Once | INTRAVENOUS | Status: AC
  Administered 2019-06-26: 1 g via INTRAVENOUS
  Filled 2019-06-26: qty 100

## 2019-06-26 MED ORDER — SODIUM CHLORIDE 0.9% FLUSH
10.0000 mL | Freq: Two times a day (BID) | INTRAVENOUS | Status: DC
  Administered 2019-06-27 (×3): 10 mL
  Administered 2019-06-28: 10:00:00 40 mL
  Administered 2019-06-29 – 2019-07-02 (×8): 10 mL
  Administered 2019-07-03: 30 mL
  Administered 2019-07-04: 10 mL
  Administered 2019-07-05: 20 mL
  Administered 2019-07-05 – 2019-07-08 (×5): 10 mL
  Administered 2019-07-09: 11:00:00 20 mL
  Administered 2019-07-09 – 2019-07-10 (×2): 10 mL
  Administered 2019-07-10: 30 mL
  Administered 2019-07-11: 23:00:00 10 mL
  Administered 2019-07-11: 20 mL

## 2019-06-26 MED ORDER — POTASSIUM PHOSPHATES 15 MMOLE/5ML IV SOLN
20.0000 mmol | Freq: Once | INTRAVENOUS | Status: AC
  Administered 2019-06-26: 20 mmol via INTRAVENOUS
  Filled 2019-06-26: qty 6.67

## 2019-06-26 MED ORDER — METOPROLOL TARTRATE 5 MG/5ML IV SOLN
5.0000 mg | INTRAVENOUS | Status: DC | PRN
  Administered 2019-06-28 – 2019-09-11 (×37): 5 mg via INTRAVENOUS
  Filled 2019-06-26 (×48): qty 5

## 2019-06-26 MED ORDER — SODIUM CHLORIDE 0.9% FLUSH
10.0000 mL | INTRAVENOUS | Status: DC | PRN
  Administered 2019-08-01: 21:00:00 10 mL

## 2019-06-26 MED ORDER — PANTOPRAZOLE SODIUM 40 MG PO PACK
40.0000 mg | PACK | Freq: Every day | ORAL | Status: DC
  Administered 2019-06-26 – 2019-06-28 (×3): 40 mg
  Filled 2019-06-26 (×3): qty 20

## 2019-06-26 MED ORDER — HYDRALAZINE HCL 20 MG/ML IJ SOLN
5.0000 mg | INTRAMUSCULAR | Status: DC | PRN
  Administered 2019-06-28: 5 mg via INTRAVENOUS
  Filled 2019-06-26: qty 1

## 2019-06-26 NOTE — Progress Notes (Signed)
Peripherally Inserted Central Catheter/Midline Placement  The IV Nurse has discussed with the patient and/or persons authorized to consent for the patient, the purpose of this procedure and the potential benefits and risks involved with this procedure.  The benefits include less needle sticks, lab draws from the catheter, and the patient may be discharged home with the catheter. Risks include, but not limited to, infection, bleeding, blood clot (thrombus formation), and puncture of an artery; nerve damage and irregular heartbeat and possibility to perform a PICC exchange if needed/ordered by physician.  Alternatives to this procedure were also discussed.  Bard Power PICC patient education guide, fact sheet on infection prevention and patient information card has been provided to patient /or left at bedside.    Consent obtained at bedside with mother  PICC/Midline Placement Documentation  PICC Double Lumen 06/26/19 PICC Right Brachial 40 cm 0 cm (Active)  Indication for Insertion or Continuance of Line Prolonged intravenous therapies 06/26/19 1600  Exposed Catheter (cm) 0 cm 06/26/19 1600  Site Assessment Clean;Dry;Intact 06/26/19 1600  Lumen #1 Status Flushed;Saline locked;Blood return noted 06/26/19 1600  Lumen #2 Status Flushed;Saline locked;Blood return noted 06/26/19 1600  Dressing Type Transparent;Securing device 06/26/19 1600  Dressing Status Clean;Dry;Intact;Antimicrobial disc in place 06/26/19 1600  Dressing Change Due 07/03/19 06/26/19 1600       Holley Bouche [place] 06/26/2019, 4:09 PM

## 2019-06-26 NOTE — Progress Notes (Signed)
Patient ID: Jay Cole, male   DOB: 01-27-1996, 24 y.o.   MRN: [id number] Vital signs have been stable Pupils are equal and reactive Trace flicker of motion in the lower extremities but no evidence of following commands Craniectomy flap is pulsatile and drainage is still moderate We will plan to repeat CT scan in a.m. and evaluate need for drain at that point Continue supportive care
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
## 2019-06-26 NOTE — Progress Notes (Signed)
Pt ETT was placed at 24 at the lips per MD order and CXR.

## 2019-06-26 NOTE — Progress Notes (Signed)
RT NOTE:  RT attempted to wean pt per MD, pt was placed on PSV/CPAP 10/5 and had Vt less than 200. RT placed pt on 15/5 with pt reaching Vt of over 300. Pt had a desaturation to 86% so PSV/CPAP was terminated at that time. MD made aware of pt wean attempt.

## 2019-06-26 NOTE — Progress Notes (Signed)
ABG unable to upload into epic from [facility].   ABG results from 06/26/2019 at 0456:   Ph: 7.36 PCO2: 49.3 PO2: 65 Bicarb: 27.8 89%

## 2019-06-27 ENCOUNTER — Inpatient Hospital Stay (HOSPITAL_COMMUNITY): Payer: Medicaid - Out of State

## 2019-06-27 LAB — BLOOD GAS, ARTERIAL
Acid-Base Excess: 4 mmol/L — ABNORMAL HIGH (ref 0.0–2.0)
Bicarbonate: 28.7 mmol/L — ABNORMAL HIGH (ref 20.0–28.0)
FIO2: 30
O2 Saturation: 92.1 %
Patient temperature: 38.3
pCO2 arterial: 51.7 mmHg — ABNORMAL HIGH (ref 32.0–48.0)
pH, Arterial: 7.37 (ref 7.350–7.450)
pO2, Arterial: 71 mmHg — ABNORMAL LOW (ref 83.0–108.0)

## 2019-06-27 LAB — POCT I-STAT 7, (LYTES, BLD GAS, ICA,H+H)
Acid-base deficit: 5 mmol/L — ABNORMAL HIGH (ref 0.0–2.0)
Bicarbonate: 21.2 mmol/L (ref 20.0–28.0)
Calcium, Ion: 1.17 mmol/L (ref 1.15–1.40)
HCT: 42 % (ref 39.0–52.0)
Hemoglobin: 14.3 g/dL (ref 13.0–17.0)
O2 Saturation: 100 %
Patient temperature: 97.3
Potassium: 4 mmol/L (ref 3.5–5.1)
Sodium: 141 mmol/L (ref 135–145)
TCO2: 22 mmol/L (ref 22–32)
pCO2 arterial: 42.5 mmHg (ref 32.0–48.0)
pH, Arterial: 7.302 — ABNORMAL LOW (ref 7.350–7.450)
pO2, Arterial: 260 mmHg — ABNORMAL HIGH (ref 83.0–108.0)

## 2019-06-27 LAB — CBC
HCT: 32.1 % — ABNORMAL LOW (ref 39.0–52.0)
Hemoglobin: 10.4 g/dL — ABNORMAL LOW (ref 13.0–17.0)
MCH: 29.2 pg (ref 26.0–34.0)
MCHC: 32.4 g/dL (ref 30.0–36.0)
MCV: 90.2 fL (ref 80.0–100.0)
Platelets: 151 10*3/uL (ref 150–400)
RBC: 3.56 MIL/uL — ABNORMAL LOW (ref 4.22–5.81)
RDW: 12.9 % (ref 11.5–15.5)
WBC: 14.2 10*3/uL — ABNORMAL HIGH (ref 4.0–10.5)
nRBC: 0 % (ref 0.0–0.2)

## 2019-06-27 LAB — BASIC METABOLIC PANEL
Anion gap: 9 (ref 5–15)
BUN: 11 mg/dL (ref 6–20)
CO2: 28 mmol/L (ref 22–32)
Calcium: 7.5 mg/dL — ABNORMAL LOW (ref 8.9–10.3)
Chloride: 105 mmol/L (ref 98–111)
Creatinine, Ser: 0.57 mg/dL — ABNORMAL LOW (ref 0.61–1.24)
GFR calc Af Amer: >60 mL/min (ref >60)
GFR calc non Af Amer: >60 mL/min (ref >60)
Glucose, Bld: 167 mg/dL — ABNORMAL HIGH (ref 70–99)
Potassium: 4.1 mmol/L (ref 3.5–5.1)
Sodium: 142 mmol/L (ref 135–145)

## 2019-06-27 LAB — PHOSPHORUS: Phosphorus: 1.6 mg/dL — ABNORMAL LOW (ref 2.5–4.6)

## 2019-06-27 LAB — GLUCOSE, CAPILLARY
Glucose-Capillary: 131 mg/dL — ABNORMAL HIGH (ref 70–99)
Glucose-Capillary: 146 mg/dL — ABNORMAL HIGH (ref 70–99)
Glucose-Capillary: 145 mg/dL — ABNORMAL HIGH (ref 70–99)
Glucose-Capillary: 178 mg/dL — ABNORMAL HIGH (ref 70–99)
Glucose-Capillary: 166 mg/dL — ABNORMAL HIGH (ref 70–99)

## 2019-06-27 LAB — MAGNESIUM: Magnesium: 2.1 mg/dL (ref 1.7–2.4)

## 2019-06-27 MED ORDER — GUAIFENESIN 100 MG/5ML PO SOLN
15.0000 mL | Freq: Four times a day (QID) | ORAL | Status: DC
  Administered 2019-06-27 – 2019-09-11 (×303): 300 mg
  Filled 2019-06-27: qty 15
  Filled 2019-06-27: qty 45
  Filled 2019-06-27 (×17): qty 15
  Filled 2019-06-27: qty 20
  Filled 2019-06-27 (×27): qty 15
  Filled 2019-06-27: qty 45
  Filled 2019-06-27 (×7): qty 15
  Filled 2019-06-27: qty 45
  Filled 2019-06-27 (×3): qty 15
  Filled 2019-06-27: qty 45
  Filled 2019-06-27 (×27): qty 15
  Filled 2019-06-27: qty 20
  Filled 2019-06-27 (×40): qty 15
  Filled 2019-06-27: qty 45
  Filled 2019-06-27: qty 15
  Filled 2019-06-27: qty 20
  Filled 2019-06-27 (×31): qty 15
  Filled 2019-06-27: qty 45
  Filled 2019-06-27 (×16): qty 15
  Filled 2019-06-27 (×2): qty 45
  Filled 2019-06-27 (×4): qty 15
  Filled 2019-06-27: qty 20
  Filled 2019-06-27 (×33): qty 15
  Filled 2019-06-27: qty 45
  Filled 2019-06-27 (×6): qty 15
  Filled 2019-06-27: qty 45
  Filled 2019-06-27 (×4): qty 15
  Filled 2019-06-27: qty 45
  Filled 2019-06-27 (×43): qty 15
  Filled 2019-06-27: qty 45
  Filled 2019-06-27 (×3): qty 15
  Filled 2019-06-27: qty 45
  Filled 2019-06-27 (×5): qty 15
  Filled 2019-06-27: qty 45
  Filled 2019-06-27 (×13): qty 15
  Filled 2019-06-27: qty 20
  Filled 2019-06-27 (×3): qty 15

## 2019-06-27 MED ORDER — SODIUM PHOSPHATES 45 MMOLE/15ML IV SOLN
30.0000 mmol | Freq: Once | INTRAVENOUS | Status: AC
  Administered 2019-06-27: 30 mmol via INTRAVENOUS
  Filled 2019-06-27: qty 10

## 2019-06-27 MED ORDER — K PHOS MONO-SOD PHOS DI & MONO 155-852-130 MG PO TABS
500.0000 mg | ORAL_TABLET | Freq: Once | ORAL | Status: AC
  Administered 2019-06-27: 500 mg
  Filled 2019-06-27: qty 2

## 2019-06-27 NOTE — Progress Notes (Signed)
Patient ID: [facility], male   DOB: 1996/06/13, 24 y.o.   MRN: [id number] CT this morning reviewed and demonstrates the presence of a parafalcine subdural hygroma.  There is some protrusion of brain through the craniectomy defect.  The drainage from the subgaleal drain has been minimal and this was removed this morning.  A dry dressing was placed over it.  Clinically the patient's pupils are 3 mm and sluggishly reactive he does have some spontaneous movement with head turning but no significant effort to follow any commands and movement of the extremities appears trace.  At this time continue supportive care.  There is a small hemorrhagic contusion subfrontal he and I have advised that I would hold off on starting any anticoagulants at this time.
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
## 2019-06-27 NOTE — Progress Notes (Signed)
Patient transported to CT and back to [facility] without complications.

## 2019-06-28 ENCOUNTER — Inpatient Hospital Stay (HOSPITAL_COMMUNITY): Payer: Medicaid - Out of State

## 2019-06-28 ENCOUNTER — Inpatient Hospital Stay (HOSPITAL_COMMUNITY): Payer: Medicaid - Out of State | Admitting: Certified Registered Nurse Anesthetist

## 2019-06-28 ENCOUNTER — Encounter (HOSPITAL_COMMUNITY): Admission: EM | Disposition: E | Payer: Self-pay | Source: Home / Self Care

## 2019-06-28 HISTORY — PX: CRANIOTOMY: SHX93

## 2019-06-28 LAB — T4, FREE: Free T4: 0.55 ng/dL — ABNORMAL LOW (ref 0.61–1.12)

## 2019-06-28 LAB — POCT I-STAT 7, (LYTES, BLD GAS, ICA,H+H)
Acid-Base Excess: 7 mmol/L — ABNORMAL HIGH (ref 0.0–2.0)
Bicarbonate: 32.2 mmol/L — ABNORMAL HIGH (ref 20.0–28.0)
Calcium, Ion: 1.09 mmol/L — ABNORMAL LOW (ref 1.15–1.40)
HCT: 33 % — ABNORMAL LOW (ref 39.0–52.0)
Hemoglobin: 11.2 g/dL — ABNORMAL LOW (ref 13.0–17.0)
O2 Saturation: 93 %
Patient temperature: 100.6
Potassium: 3.9 mmol/L (ref 3.5–5.1)
Sodium: 138 mmol/L (ref 135–145)
TCO2: 34 mmol/L — ABNORMAL HIGH (ref 22–32)
pCO2 arterial: 49.1 mmHg — ABNORMAL HIGH (ref 32.0–48.0)
pH, Arterial: 7.429 (ref 7.350–7.450)
pO2, Arterial: 69 mmHg — ABNORMAL LOW (ref 83.0–108.0)

## 2019-06-28 LAB — CBC
HCT: 32.3 % — ABNORMAL LOW (ref 39.0–52.0)
Hemoglobin: 10.4 g/dL — ABNORMAL LOW (ref 13.0–17.0)
MCH: 29.1 pg (ref 26.0–34.0)
MCHC: 32.2 g/dL (ref 30.0–36.0)
MCV: 90.2 fL (ref 80.0–100.0)
Platelets: 200 10*3/uL (ref 150–400)
RBC: 3.58 MIL/uL — ABNORMAL LOW (ref 4.22–5.81)
RDW: 12.8 % (ref 11.5–15.5)
WBC: 16.3 10*3/uL — ABNORMAL HIGH (ref 4.0–10.5)
nRBC: 0.2 % (ref 0.0–0.2)

## 2019-06-28 LAB — BASIC METABOLIC PANEL
Anion gap: 8 (ref 5–15)
BUN: 9 mg/dL (ref 6–20)
CO2: 27 mmol/L (ref 22–32)
Calcium: 7.1 mg/dL — ABNORMAL LOW (ref 8.9–10.3)
Chloride: 103 mmol/L (ref 98–111)
Creatinine, Ser: 0.49 mg/dL — ABNORMAL LOW (ref 0.61–1.24)
GFR calc Af Amer: >60 mL/min (ref >60)
GFR calc non Af Amer: >60 mL/min (ref >60)
Glucose, Bld: 174 mg/dL — ABNORMAL HIGH (ref 70–99)
Potassium: 3.9 mmol/L (ref 3.5–5.1)
Sodium: 138 mmol/L (ref 135–145)

## 2019-06-28 LAB — TSH: TSH: 0.088 u[IU]/mL — ABNORMAL LOW (ref 0.350–4.500)

## 2019-06-28 LAB — GLUCOSE, CAPILLARY
Glucose-Capillary: 170 mg/dL — ABNORMAL HIGH (ref 70–99)
Glucose-Capillary: 172 mg/dL — ABNORMAL HIGH (ref 70–99)
Glucose-Capillary: 195 mg/dL — ABNORMAL HIGH (ref 70–99)
Glucose-Capillary: 160 mg/dL — ABNORMAL HIGH (ref 70–99)
Glucose-Capillary: 191 mg/dL — ABNORMAL HIGH (ref 70–99)

## 2019-06-28 LAB — PHOSPHORUS: Phosphorus: 2.4 mg/dL — ABNORMAL LOW (ref 2.5–4.6)

## 2019-06-28 LAB — TRIGLYCERIDES: Triglycerides: 137 mg/dL (ref <150)

## 2019-06-28 SURGERY — CRANIOTOMY HEMATOMA EVACUATION SUBDURAL
Anesthesia: General

## 2019-06-28 MED ORDER — PROMETHAZINE HCL 25 MG PO TABS: 12.5000 mg | ORAL_TABLET | ORAL | Status: DC | PRN

## 2019-06-28 MED ORDER — SENNA 8.6 MG PO TABS
1.0000 | ORAL_TABLET | Freq: Two times a day (BID) | ORAL | Status: DC
  Filled 2019-06-28: qty 1

## 2019-06-28 MED ORDER — POLYETHYLENE GLYCOL 3350 17 G PO PACK: 17.0000 g | PACK | Freq: Every day | ORAL | Status: DC | PRN

## 2019-06-28 MED ORDER — SODIUM CHLORIDE 0.9 % IV SOLN: INTRAVENOUS | Status: DC | PRN

## 2019-06-28 MED ORDER — MICROFIBRILLAR COLL HEMOSTAT EX PADS
MEDICATED_PAD | CUTANEOUS | Status: DC | PRN
  Administered 2019-06-28: 1 via TOPICAL

## 2019-06-28 MED ORDER — FLEET ENEMA 7-19 GM/118ML RE ENEM: 1.0000 | ENEMA | Freq: Once | RECTAL | Status: DC | PRN

## 2019-06-28 MED ORDER — SODIUM CHLORIDE 0.9 % IV SOLN
INTRAVENOUS | Status: DC | PRN
  Administered 2019-06-28: 18:00:00 500 mL

## 2019-06-28 MED ORDER — PANTOPRAZOLE SODIUM 40 MG IV SOLR
40.0000 mg | Freq: Every day | INTRAVENOUS | Status: DC
  Administered 2019-06-29 – 2019-07-13 (×15): 40 mg via INTRAVENOUS
  Filled 2019-06-28 (×15): qty 40

## 2019-06-28 MED ORDER — 0.9 % SODIUM CHLORIDE (POUR BTL) OPTIME
TOPICAL | Status: DC | PRN
  Administered 2019-06-28 (×2): 1000 mL

## 2019-06-28 MED ORDER — METOPROLOL TARTRATE 5 MG/5ML IV SOLN
5.0000 mg | Freq: Once | INTRAVENOUS | Status: AC
  Administered 2019-06-28: 5 mg via INTRAVENOUS

## 2019-06-28 MED ORDER — ONDANSETRON HCL 4 MG/2ML IJ SOLN
4.0000 mg | INTRAMUSCULAR | Status: DC | PRN
  Administered 2019-08-04 – 2019-08-07 (×2): 4 mg via INTRAVENOUS
  Filled 2019-06-28 (×3): qty 2

## 2019-06-28 MED ORDER — FUROSEMIDE 10 MG/ML IJ SOLN
40.0000 mg | Freq: Once | INTRAMUSCULAR | Status: AC
  Administered 2019-06-28: 40 mg via INTRAVENOUS
  Filled 2019-06-28: qty 4

## 2019-06-28 MED ORDER — DOCUSATE SODIUM 100 MG PO CAPS
100.0000 mg | ORAL_CAPSULE | Freq: Two times a day (BID) | ORAL | Status: DC
  Filled 2019-06-28: qty 1

## 2019-06-28 MED ORDER — NICARDIPINE HCL IN NACL 20-0.86 MG/200ML-% IV SOLN
3.0000 mg/h | INTRAVENOUS | Status: DC
  Administered 2019-06-28: 11 mg/h via INTRAVENOUS
  Administered 2019-06-28: 3 mg/h via INTRAVENOUS
  Administered 2019-06-28: 15 mg/h via INTRAVENOUS
  Filled 2019-06-28 (×6): qty 200

## 2019-06-28 MED ORDER — ONDANSETRON HCL 4 MG PO TABS: 4.0000 mg | ORAL_TABLET | ORAL | Status: DC | PRN

## 2019-06-28 MED ORDER — HYDRALAZINE HCL 20 MG/ML IJ SOLN
10.0000 mg | INTRAMUSCULAR | Status: DC | PRN
  Administered 2019-08-16 – 2019-08-19 (×2): 10 mg via INTRAVENOUS
  Filled 2019-06-28 (×3): qty 1

## 2019-06-28 MED ORDER — AMLODIPINE BESYLATE 10 MG PO TABS
10.0000 mg | ORAL_TABLET | Freq: Every day | ORAL | Status: DC
  Administered 2019-06-28: 14:00:00 10 mg via ORAL
  Filled 2019-06-28 (×2): qty 1

## 2019-06-28 MED ORDER — ROCURONIUM BROMIDE 50 MG/5ML IV SOSY
PREFILLED_SYRINGE | INTRAVENOUS | Status: DC | PRN
  Administered 2019-06-28 (×2): 50 mg via INTRAVENOUS

## 2019-06-28 MED ORDER — BISACODYL 10 MG RE SUPP: 10.0000 mg | Freq: Every day | RECTAL | Status: DC | PRN

## 2019-06-28 MED ORDER — SODIUM PHOSPHATES 45 MMOLE/15ML IV SOLN
20.0000 mmol | Freq: Once | INTRAVENOUS | Status: AC
  Administered 2019-06-28: 20 mmol via INTRAVENOUS
  Filled 2019-06-28: qty 6.67

## 2019-06-28 MED ORDER — BACITRACIN ZINC 500 UNIT/GM EX OINT
TOPICAL_OINTMENT | CUTANEOUS | Status: DC | PRN
  Administered 2019-06-28: 1 via TOPICAL

## 2019-06-28 MED ORDER — LABETALOL HCL 5 MG/ML IV SOLN
10.0000 mg | INTRAVENOUS | Status: DC | PRN
  Administered 2019-07-10 (×2): 20 mg via INTRAVENOUS
  Administered 2019-07-13: 40 mg via INTRAVENOUS
  Administered 2019-07-22 – 2019-08-05 (×7): 20 mg via INTRAVENOUS
  Administered 2019-08-16: 10 mg via INTRAVENOUS
  Administered 2019-08-16: 30 mg via INTRAVENOUS
  Administered 2019-08-19: 11:00:00 40 mg via INTRAVENOUS
  Administered 2019-08-19 – 2019-08-20 (×2): 20 mg via INTRAVENOUS
  Administered 2019-09-11: 06:00:00 40 mg via INTRAVENOUS
  Filled 2019-06-28 (×9): qty 4
  Filled 2019-06-28: qty 8
  Filled 2019-06-28 (×2): qty 4
  Filled 2019-06-28: qty 8
  Filled 2019-06-28 (×2): qty 4
  Filled 2019-06-28: qty 8
  Filled 2019-06-28 (×2): qty 4
  Filled 2019-06-28 (×2): qty 8

## 2019-06-28 SURGICAL SUPPLY — 57 items
BAG DECANTER FOR FLEXI CONT (MISCELLANEOUS) ×3 IMPLANT
BASKET BONE COLLECTION (BASKET) IMPLANT
BATTERY IQ STERILE (MISCELLANEOUS) ×3 IMPLANT
BIT DRILL WIRE PASS 1.3MM (BIT) IMPLANT
BNDG GAUZE ELAST 4 BULKY (GAUZE/BANDAGES/DRESSINGS) ×3 IMPLANT
BTRY SRG DRVR 1.5 IQ (MISCELLANEOUS) ×1
BUR ACORN 6.0 PRECISION (BURR) ×1 IMPLANT
BUR ACORN 6.0MM PRECISION (BURR)
BUR SPIRAL ROUTER 2.3 (BUR) IMPLANT
BUR SPIRAL ROUTER 2.3MM (BUR)
CANISTER SUCT 3000ML PPV (MISCELLANEOUS) ×3 IMPLANT
CLIP VESOCCLUDE MED 6/CT (CLIP) IMPLANT
COVER WAND RF STERILE (DRAPES) ×3 IMPLANT
DECANTER SPIKE VIAL GLASS SM (MISCELLANEOUS) ×3 IMPLANT
DRAIN CHANNEL 10M FLAT 3/4 FLT (DRAIN) IMPLANT
DRAIN PENROSE 1/2X12 LTX STRL (WOUND CARE) ×2 IMPLANT
DRAPE WARM FLUID 44X44 (DRAPES) ×3 IMPLANT
DRILL WIRE PASS 1.3MM (BIT)
DRSG ADAPTIC 3X8 NADH LF (GAUZE/BANDAGES/DRESSINGS) IMPLANT
DRSG PAD ABDOMINAL 8X10 ST (GAUZE/BANDAGES/DRESSINGS) IMPLANT
DURAPREP 6ML APPLICATOR 50/CS (WOUND CARE) ×3 IMPLANT
ELECT REM PT RETURN 9FT ADLT (ELECTROSURGICAL) ×3
ELECTRODE REM PT RTRN 9FT ADLT (ELECTROSURGICAL) ×1 IMPLANT
EVACUATOR SILICONE 100CC (DRAIN) IMPLANT
GAUZE 4X4 16PLY RFD (DISPOSABLE) IMPLANT
GAUZE SPONGE 4X4 12PLY STRL (GAUZE/BANDAGES/DRESSINGS) ×2 IMPLANT
GLOVE BIOGEL PI IND STRL 8.5 (GLOVE) ×1 IMPLANT
GLOVE BIOGEL PI INDICATOR 8.5 (GLOVE) ×2
GLOVE ECLIPSE 8.5 STRL (GLOVE) ×3 IMPLANT
GLOVE EXAM NITRILE XL STR (GLOVE) IMPLANT
GOWN STRL REUS W/ TWL LRG LVL3 (GOWN DISPOSABLE) IMPLANT
GOWN STRL REUS W/ TWL XL LVL3 (GOWN DISPOSABLE) ×1 IMPLANT
GOWN STRL REUS W/TWL 2XL LVL3 (GOWN DISPOSABLE) ×3 IMPLANT
GOWN STRL REUS W/TWL LRG LVL3 (GOWN DISPOSABLE)
GOWN STRL REUS W/TWL XL LVL3 (GOWN DISPOSABLE) ×3
HEMOSTAT SURGICEL 2X14 (HEMOSTASIS) IMPLANT
KIT BASIN OR (CUSTOM PROCEDURE TRAY) ×3 IMPLANT
KIT TURNOVER KIT B (KITS) ×3 IMPLANT
NEEDLE HYPO 22GX1.5 SAFETY (NEEDLE) ×3 IMPLANT
NS IRRIG 1000ML POUR BTL (IV SOLUTION) ×3 IMPLANT
PACK CRANIOTOMY CUSTOM (CUSTOM PROCEDURE TRAY) ×3 IMPLANT
PATTIES SURGICAL .5 X.5 (GAUZE/BANDAGES/DRESSINGS) IMPLANT
PATTIES SURGICAL .5 X3 (DISPOSABLE) IMPLANT
PATTIES SURGICAL 1X1 (DISPOSABLE) IMPLANT
PIN MAYFIELD SKULL DISP (PIN) IMPLANT
SPECIMEN JAR SMALL (MISCELLANEOUS) IMPLANT
SPONGE NEURO XRAY DETECT 1X3 (DISPOSABLE) IMPLANT
SPONGE SURGIFOAM ABS GEL 100 (HEMOSTASIS) IMPLANT
STAPLER SKIN PROX WIDE 3.9 (STAPLE) ×3 IMPLANT
SUT ETHILON 3 0 FSL (SUTURE) IMPLANT
SUT NURALON 4 0 TR CR/8 (SUTURE) ×6 IMPLANT
SUT VIC AB 2-0 CP2 18 (SUTURE) ×6 IMPLANT
TOWEL GREEN STERILE (TOWEL DISPOSABLE) ×3 IMPLANT
TOWEL GREEN STERILE FF (TOWEL DISPOSABLE) ×3 IMPLANT
TRAY FOLEY MTR SLVR 16FR STAT (SET/KITS/TRAYS/PACK) IMPLANT
UNDERPAD 30X30 (UNDERPADS AND DIAPERS) IMPLANT
WATER STERILE IRR 1000ML POUR (IV SOLUTION) ×3 IMPLANT
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
## 2019-06-28 NOTE — Transfer of Care (Signed)
Immediate Anesthesia Transfer of Care Note  Patient: Jay Cole  Procedure(s) Performed: CRANIOTOMY HEMATOMA EVACUATION SUBDURAL (N/A )  Patient Location: NICU  Anesthesia Type:General  Level of Consciousness: sedated, unresponsive and Patient remains intubated per anesthesia plan  Airway & Oxygen Therapy: Patient remains intubated per anesthesia plan and transported on vent with RT  Post-op Assessment: Report given to RN and Post -op Vital signs reviewed and stable  Post vital signs: Reviewed and stable  Last Vitals:  Vitals Value Taken Time  BP    Temp 37 C 07/17/2019 1833  Pulse 115 06/27/2019 1833  Resp 30 07/06/2019 1833  SpO2 96 % 06/25/2019 1833  Vitals shown include unvalidated device data.  Last Pain:  Vitals:   06/27/19 1600  TempSrc: Bladder         Complications: No apparent anesthesia complications

## 2019-06-28 NOTE — Progress Notes (Signed)
Patient ID: Jay Cole, male   DOB: 1996/01/03, 24 y.o.   MRN: [id number] Patient noted to have decerebrate posturing today CT scan was performed this afternoon which demonstrates that the subdural hygroma is worsening and there is a larger contusion in the craniotomy defect with significant brain swelling on that left side.  After review of the scan I have advised that we should return to the operating room to evacuate the subdural hygroma and decompress any brain that may be herniating.  I have discussed situation with the patient's mother by phone.  This does not portend a good prognosis however lower brainstem structures still appear to be intact.  We will explained surgery at this time.

## 2019-06-28 NOTE — Progress Notes (Signed)
RT called to pt room for desaturations, MD bagging pt when RT arrived with a saturation of 86%. RT placed a peep valve on bag at 12 per MD and pt saturations increased to 95%. RT placed pt back on full vent support with a peep of 12 and FiO2 of 100% per MD order. Pt now has a saturation of 93%. RT will continue to monitor pt status.

## 2019-06-28 NOTE — Progress Notes (Signed)
Pt ETT retracted to 22 @ the lips, was originally at 24, per MD order. RN ordered a CXR for confirmation. RT will continue to monitor.

## 2019-06-28 NOTE — Progress Notes (Signed)
Pt was transported to and from OR via ventilator. There were no complications during transport noted. Pt back in room 4N25. RT will continue to monitor.

## 2019-06-28 NOTE — Anesthesia Postprocedure Evaluation (Signed)
Anesthesia Post Note  Patient: Jay Cole  Procedure(s) Performed: CRANIOTOMY HEMATOMA EVACUATION SUBDURAL (N/A )     Patient location during evaluation: ICU Anesthesia Type: General Level of consciousness: patient remains intubated per anesthesia plan Pain management: pain level controlled Vital Signs Assessment: post-procedure vital signs reviewed and stable Respiratory status: patient remains intubated per anesthesia plan Cardiovascular status: stable Postop Assessment: no apparent nausea or vomiting Anesthetic complications: no    Last Vitals:  Vitals:   06/29/2019 1630 06/30/2019 1645  BP: (!) 142/57 (!) 145/64  Pulse: (!) 119 (!) 155  Resp: (!) 34 (!) 30  Temp: 37.5 C   SpO2: 96% 96%    Last Pain:  Vitals:   06/27/19 1600  TempSrc: Bladder                 Kada Friesen

## 2019-06-28 NOTE — Treatment Plan (Signed)
Hospitalist service will be consulted tomorrow to assist in the evaluation and management of patient with abnormal thyroid function test TSH of 0.088 and free T4 of 0.55.  Patient was at the OR this evening for reexploration of craniectomy debridement of contusion and evacuation of subdural hygroma.  Consult has therefore been rescheduled for tomorrow during which patient will be seen and examined by hospitalist team..

## 2019-06-28 NOTE — Progress Notes (Signed)
Pt was transported to and from CT via ventilator with no complications noted. Pt now back in room 4N25. RT will continue to monitor.

## 2019-06-28 NOTE — Anesthesia Preprocedure Evaluation (Signed)
Anesthesia Evaluation  Patient identified by MRN, date of birth, ID band Patient awake    Reviewed: Allergy & Precautions, NPO status , Patient's Chart, lab work & pertinent test results  Airway Mallampati: Intubated       Dental   Pulmonary  History noted. CG      + intubated    Cardiovascular negative cardio ROS   Rhythm:Regular Rate:Normal     Neuro/Psych    GI/Hepatic negative GI ROS, Neg liver ROS,   Endo/Other  negative endocrine ROS  Renal/GU negative Renal ROS     Musculoskeletal   Abdominal   Peds  Hematology   Anesthesia Other Findings   Reproductive/Obstetrics                             Anesthesia Physical Anesthesia Plan  ASA: IV  Anesthesia Plan: General   Post-op Pain Management:    Induction: Intravenous  PONV Risk Score and Plan: 2 and Midazolam  Airway Management Planned: Oral ETT  Additional Equipment:   Intra-op Plan:   Post-operative Plan: Post-operative intubation/ventilation  Informed Consent: I have reviewed the patients History and Physical, chart, labs and discussed the procedure including the risks, benefits and alternatives for the proposed anesthesia with the patient or authorized representative who has indicated his/her understanding and acceptance.       Plan Discussed with: Anesthesiologist, CRNA and Surgeon  Anesthesia Plan Comments:         Anesthesia Quick Evaluation
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
## 2019-06-29 ENCOUNTER — Inpatient Hospital Stay (HOSPITAL_COMMUNITY): Payer: Medicaid - Out of State

## 2019-06-29 DIAGNOSIS — E039 Hypothyroidism, unspecified: Secondary | ICD-10-CM

## 2019-06-29 LAB — CBC
HCT: 34.3 % — ABNORMAL LOW (ref 39.0–52.0)
Hemoglobin: 11.1 g/dL — ABNORMAL LOW (ref 13.0–17.0)
MCH: 29 pg (ref 26.0–34.0)
MCHC: 32.4 g/dL (ref 30.0–36.0)
MCV: 89.6 fL (ref 80.0–100.0)
Platelets: 238 10*3/uL (ref 150–400)
RBC: 3.83 MIL/uL — ABNORMAL LOW (ref 4.22–5.81)
RDW: 13 % (ref 11.5–15.5)
WBC: 17.8 10*3/uL — ABNORMAL HIGH (ref 4.0–10.5)
nRBC: 0.2 % (ref 0.0–0.2)

## 2019-06-29 LAB — BASIC METABOLIC PANEL
Anion gap: 9 (ref 5–15)
BUN: 13 mg/dL (ref 6–20)
CO2: 31 mmol/L (ref 22–32)
Calcium: 7.9 mg/dL — ABNORMAL LOW (ref 8.9–10.3)
Chloride: 98 mmol/L (ref 98–111)
Creatinine, Ser: 0.51 mg/dL — ABNORMAL LOW (ref 0.61–1.24)
GFR calc Af Amer: >60 mL/min (ref >60)
GFR calc non Af Amer: >60 mL/min (ref >60)
Glucose, Bld: 169 mg/dL — ABNORMAL HIGH (ref 70–99)
Potassium: 3.8 mmol/L (ref 3.5–5.1)
Sodium: 138 mmol/L (ref 135–145)

## 2019-06-29 LAB — GLUCOSE, CAPILLARY
Glucose-Capillary: 183 mg/dL — ABNORMAL HIGH (ref 70–99)
Glucose-Capillary: 159 mg/dL — ABNORMAL HIGH (ref 70–99)
Glucose-Capillary: 154 mg/dL — ABNORMAL HIGH (ref 70–99)
Glucose-Capillary: 134 mg/dL — ABNORMAL HIGH (ref 70–99)
Glucose-Capillary: 153 mg/dL — ABNORMAL HIGH (ref 70–99)
Glucose-Capillary: 131 mg/dL — ABNORMAL HIGH (ref 70–99)

## 2019-06-29 LAB — FOLATE: Folate: 8.5 ng/mL (ref >5.9)

## 2019-06-29 LAB — PHOSPHORUS: Phosphorus: 2.8 mg/dL (ref 2.5–4.6)

## 2019-06-29 LAB — MAGNESIUM: Magnesium: 2.1 mg/dL (ref 1.7–2.4)

## 2019-06-29 LAB — T3, FREE: T3, Free: 1.3 pg/mL — ABNORMAL LOW (ref 2.0–4.4)

## 2019-06-29 LAB — VITAMIN B12: Vitamin B-12: 327 pg/mL (ref 180–914)

## 2019-06-29 MED ORDER — POLYETHYLENE GLYCOL 3350 17 G PO PACK: 17.0000 g | PACK | Freq: Every day | ORAL | Status: DC | PRN

## 2019-06-29 MED ORDER — ACETAMINOPHEN 160 MG/5ML PO SOLN
1000.0000 mg | Freq: Four times a day (QID) | ORAL | Status: DC
  Administered 2019-06-29 – 2019-09-05 (×251): 1000 mg
  Filled 2019-06-29 (×258): qty 40.6

## 2019-06-29 MED ORDER — COSYNTROPIN 0.25 MG IJ SOLR
0.2500 mg | Freq: Once | INTRAMUSCULAR | Status: AC
  Administered 2019-06-29: 0.25 mg via INTRAVENOUS
  Filled 2019-06-29: qty 0.25

## 2019-06-29 MED ORDER — LEVOTHYROXINE SODIUM 100 MCG/5ML IV SOLN
25.0000 ug | Freq: Every day | INTRAVENOUS | Status: DC
  Administered 2019-06-29 – 2019-06-30 (×2): 25 ug via INTRAVENOUS
  Filled 2019-06-29 (×2): qty 5

## 2019-06-29 MED ORDER — SENNA 8.6 MG PO TABS
1.0000 | ORAL_TABLET | Freq: Two times a day (BID) | ORAL | Status: DC
  Administered 2019-06-29 – 2019-09-06 (×60): 8.6 mg
  Filled 2019-06-29 (×67): qty 1

## 2019-06-29 MED ORDER — ACETAMINOPHEN 160 MG/5ML PO SOLN: 1000.0000 mg | Freq: Four times a day (QID) | ORAL | Status: DC | PRN

## 2019-06-29 MED ORDER — AMLODIPINE BESYLATE 10 MG PO TABS
10.0000 mg | ORAL_TABLET | Freq: Every day | ORAL | Status: DC
  Administered 2019-06-29 – 2019-07-17 (×17): 10 mg
  Filled 2019-06-29 (×17): qty 1

## 2019-06-29 MED ORDER — DOCUSATE SODIUM 50 MG/5ML PO LIQD
100.0000 mg | Freq: Two times a day (BID) | ORAL | Status: DC
  Administered 2019-06-29 – 2019-09-06 (×67): 100 mg
  Filled 2019-06-29 (×77): qty 10

## 2019-06-29 MED ORDER — PROMETHAZINE HCL 25 MG PO TABS: 12.5000 mg | ORAL_TABLET | ORAL | Status: DC | PRN

## 2019-06-29 NOTE — Progress Notes (Signed)
Patient ID: Jay Cole, male   DOB: [birth year], 24 y.o.   MRN: [id number] Patient develops tachycardia with minimal stimulation.  Pupils appear small the left pupil is difficult  to judge secondary to marked swelling.  Drains have had minimal output and I have removed both of the Penrose drains.  The incision has been closed with surgical staples.  Had a phone conversation with the mother and the father noting that he has a severe injury with substantial swelling despite the craniectomy and I do not believe further surgery is going to yield any improvement.  I noted to them that he is not brain dead but he has signs of a severe brain injury.  At this point I believe that we should continue supportive care as we have been doing and we will have to see how this process evolves itself.
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
## 2019-06-29 NOTE — Progress Notes (Signed)
Notified Costella about bloody mucous draining from patients nose, bouts of tachycardia, and continued posturing.  No orders at this time, will do AM CT sooner if neuro status worsens.

## 2019-06-30 ENCOUNTER — Inpatient Hospital Stay (HOSPITAL_COMMUNITY): Payer: Medicaid - Out of State

## 2019-06-30 DIAGNOSIS — R946 Abnormal results of thyroid function studies: Secondary | ICD-10-CM

## 2019-06-30 LAB — GLUCOSE, CAPILLARY
Glucose-Capillary: 147 mg/dL — ABNORMAL HIGH (ref 70–99)
Glucose-Capillary: 171 mg/dL — ABNORMAL HIGH (ref 70–99)
Glucose-Capillary: 138 mg/dL — ABNORMAL HIGH (ref 70–99)
Glucose-Capillary: 148 mg/dL — ABNORMAL HIGH (ref 70–99)
Glucose-Capillary: 115 mg/dL — ABNORMAL HIGH (ref 70–99)
Glucose-Capillary: 126 mg/dL — ABNORMAL HIGH (ref 70–99)

## 2019-06-30 LAB — POCT I-STAT 7, (LYTES, BLD GAS, ICA,H+H)
Acid-Base Excess: 10 mmol/L — ABNORMAL HIGH (ref 0.0–2.0)
Bicarbonate: 36.1 mmol/L — ABNORMAL HIGH (ref 20.0–28.0)
Calcium, Ion: 1.15 mmol/L (ref 1.15–1.40)
HCT: 29 % — ABNORMAL LOW (ref 39.0–52.0)
Hemoglobin: 9.9 g/dL — ABNORMAL LOW (ref 13.0–17.0)
O2 Saturation: 95 %
Patient temperature: 101.5
Potassium: 3.5 mmol/L (ref 3.5–5.1)
Sodium: 142 mmol/L (ref 135–145)
TCO2: 38 mmol/L — ABNORMAL HIGH (ref 22–32)
pCO2 arterial: 57.6 mmHg — ABNORMAL HIGH (ref 32.0–48.0)
pH, Arterial: 7.412 (ref 7.350–7.450)
pO2, Arterial: 81 mmHg — ABNORMAL LOW (ref 83.0–108.0)

## 2019-06-30 LAB — CBC
HCT: 30.7 % — ABNORMAL LOW (ref 39.0–52.0)
Hemoglobin: 10.1 g/dL — ABNORMAL LOW (ref 13.0–17.0)
MCH: 30.4 pg (ref 26.0–34.0)
MCHC: 32.9 g/dL (ref 30.0–36.0)
MCV: 92.5 fL (ref 80.0–100.0)
Platelets: 253 10*3/uL (ref 150–400)
RBC: 3.32 MIL/uL — ABNORMAL LOW (ref 4.22–5.81)
RDW: 13.4 % (ref 11.5–15.5)
WBC: 19 10*3/uL — ABNORMAL HIGH (ref 4.0–10.5)
nRBC: 0.3 % — ABNORMAL HIGH (ref 0.0–0.2)

## 2019-06-30 LAB — BASIC METABOLIC PANEL
Anion gap: 8 (ref 5–15)
BUN: 14 mg/dL (ref 6–20)
CO2: 31 mmol/L (ref 22–32)
Calcium: 7.7 mg/dL — ABNORMAL LOW (ref 8.9–10.3)
Chloride: 100 mmol/L (ref 98–111)
Creatinine, Ser: 0.49 mg/dL — ABNORMAL LOW (ref 0.61–1.24)
GFR calc Af Amer: >60 mL/min (ref >60)
GFR calc non Af Amer: >60 mL/min (ref >60)
Glucose, Bld: 164 mg/dL — ABNORMAL HIGH (ref 70–99)
Potassium: 3.8 mmol/L (ref 3.5–5.1)
Sodium: 139 mmol/L (ref 135–145)

## 2019-06-30 LAB — BLOOD GAS, ARTERIAL
Acid-Base Excess: 7.8 mmol/L — ABNORMAL HIGH (ref 0.0–2.0)
Bicarbonate: 33 mmol/L — ABNORMAL HIGH (ref 20.0–28.0)
Drawn by: 24487
FIO2: 100
O2 Saturation: 91.6 %
Patient temperature: 38.5
pCO2 arterial: 62 mmHg — ABNORMAL HIGH (ref 32.0–48.0)
pH, Arterial: 7.355 (ref 7.350–7.450)
pO2, Arterial: 71.9 mmHg — ABNORMAL LOW (ref 83.0–108.0)

## 2019-06-30 LAB — MAGNESIUM: Magnesium: 2.3 mg/dL (ref 1.7–2.4)

## 2019-06-30 LAB — ACTH STIMULATION, 3 TIME POINTS
Cortisol, 30 Min: 34.6 ug/dL
Cortisol, Base: 11.6 ug/dL

## 2019-06-30 LAB — PHOSPHORUS: Phosphorus: 2.5 mg/dL (ref 2.5–4.6)
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
## 2019-07-01 ENCOUNTER — Inpatient Hospital Stay (HOSPITAL_COMMUNITY): Payer: Medicaid - Out of State

## 2019-07-01 DIAGNOSIS — T148XXA Other injury of unspecified body region, initial encounter: Secondary | ICD-10-CM

## 2019-07-01 LAB — BASIC METABOLIC PANEL
Anion gap: 11 (ref 5–15)
BUN: 13 mg/dL (ref 6–20)
CO2: 31 mmol/L (ref 22–32)
Calcium: 8.2 mg/dL — ABNORMAL LOW (ref 8.9–10.3)
Chloride: 103 mmol/L (ref 98–111)
Creatinine, Ser: 0.58 mg/dL — ABNORMAL LOW (ref 0.61–1.24)
GFR calc Af Amer: >60 mL/min (ref >60)
GFR calc non Af Amer: >60 mL/min (ref >60)
Glucose, Bld: 149 mg/dL — ABNORMAL HIGH (ref 70–99)
Potassium: 3.6 mmol/L (ref 3.5–5.1)
Sodium: 145 mmol/L (ref 135–145)

## 2019-07-01 LAB — URINALYSIS, ROUTINE W REFLEX MICROSCOPIC
Bilirubin Urine: NEGATIVE
Glucose, UA: NEGATIVE mg/dL
Hgb urine dipstick: NEGATIVE
Ketones, ur: NEGATIVE mg/dL
Leukocytes,Ua: NEGATIVE
Nitrite: NEGATIVE
Protein, ur: NEGATIVE mg/dL
Specific Gravity, Urine: 1.029 (ref 1.005–1.030)
pH: 5 (ref 5.0–8.0)

## 2019-07-01 LAB — POCT I-STAT 7, (LYTES, BLD GAS, ICA,H+H)
Acid-Base Excess: 9 mmol/L — ABNORMAL HIGH (ref 0.0–2.0)
Bicarbonate: 35.4 mmol/L — ABNORMAL HIGH (ref 20.0–28.0)
Calcium, Ion: 1.15 mmol/L (ref 1.15–1.40)
HCT: 28 % — ABNORMAL LOW (ref 39.0–52.0)
Hemoglobin: 9.5 g/dL — ABNORMAL LOW (ref 13.0–17.0)
O2 Saturation: 91 %
Patient temperature: 38.3
Potassium: 3.8 mmol/L (ref 3.5–5.1)
Sodium: 143 mmol/L (ref 135–145)
TCO2: 37 mmol/L — ABNORMAL HIGH (ref 22–32)
pCO2 arterial: 59.5 mmHg — ABNORMAL HIGH (ref 32.0–48.0)
pH, Arterial: 7.388 (ref 7.350–7.450)
pO2, Arterial: 68 mmHg — ABNORMAL LOW (ref 83.0–108.0)

## 2019-07-01 LAB — PHOSPHORUS: Phosphorus: 3.1 mg/dL (ref 2.5–4.6)

## 2019-07-01 LAB — GLUCOSE, CAPILLARY
Glucose-Capillary: 104 mg/dL — ABNORMAL HIGH (ref 70–99)
Glucose-Capillary: 116 mg/dL — ABNORMAL HIGH (ref 70–99)
Glucose-Capillary: 132 mg/dL — ABNORMAL HIGH (ref 70–99)
Glucose-Capillary: 133 mg/dL — ABNORMAL HIGH (ref 70–99)
Glucose-Capillary: 141 mg/dL — ABNORMAL HIGH (ref 70–99)
Glucose-Capillary: 147 mg/dL — ABNORMAL HIGH (ref 70–99)
Glucose-Capillary: 147 mg/dL — ABNORMAL HIGH (ref 70–99)
Glucose-Capillary: 158 mg/dL — ABNORMAL HIGH (ref 70–99)

## 2019-07-01 LAB — CBC
HCT: 31.9 % — ABNORMAL LOW (ref 39.0–52.0)
Hemoglobin: 9.8 g/dL — ABNORMAL LOW (ref 13.0–17.0)
MCH: 28.7 pg (ref 26.0–34.0)
MCHC: 30.7 g/dL (ref 30.0–36.0)
MCV: 93.5 fL (ref 80.0–100.0)
Platelets: 306 10*3/uL (ref 150–400)
RBC: 3.41 MIL/uL — ABNORMAL LOW (ref 4.22–5.81)
RDW: 13.6 % (ref 11.5–15.5)
WBC: 25.6 10*3/uL — ABNORMAL HIGH (ref 4.0–10.5)
nRBC: 0.2 % (ref 0.0–0.2)

## 2019-07-01 LAB — BLOOD GAS, ARTERIAL
Acid-Base Excess: 8.9 mmol/L — ABNORMAL HIGH (ref 0.0–2.0)
Bicarbonate: 33.9 mmol/L — ABNORMAL HIGH (ref 20.0–28.0)
Drawn by: 24487
FIO2: 100
O2 Saturation: 95.4 %
Patient temperature: 37
pCO2 arterial: 56.1 mmHg — ABNORMAL HIGH (ref 32.0–48.0)
pH, Arterial: 7.398 (ref 7.350–7.450)
pO2, Arterial: 76.6 mmHg — ABNORMAL LOW (ref 83.0–108.0)

## 2019-07-01 LAB — MAGNESIUM: Magnesium: 2.1 mg/dL (ref 1.7–2.4)

## 2019-07-01 LAB — TRIGLYCERIDES: Triglycerides: 181 mg/dL — ABNORMAL HIGH (ref <150)

## 2019-07-01 MED ORDER — FUROSEMIDE 10 MG/ML IJ SOLN
40.0000 mg | Freq: Once | INTRAMUSCULAR | Status: AC
  Administered 2019-07-01: 40 mg via INTRAVENOUS
  Filled 2019-07-01: qty 4

## 2019-07-01 MED ORDER — POTASSIUM CHLORIDE 20 MEQ/15ML (10%) PO SOLN
40.0000 meq | Freq: Once | ORAL | Status: AC
  Administered 2019-07-01: 40 meq

## 2019-07-01 MED ORDER — POTASSIUM CHLORIDE 20 MEQ/15ML (10%) PO SOLN
40.0000 meq | Freq: Once | ORAL | Status: DC
  Filled 2019-07-01: qty 30

## 2019-07-01 MED FILL — Fentanyl Citrate Preservative Free (PF) Inj 2500 MCG/50ML: INTRAMUSCULAR | Qty: 50 | Status: AC

## 2019-07-01 MED FILL — Sodium Chloride IV Soln 0.9%: INTRAVENOUS | Qty: 250 | Status: AC

## 2019-07-01 NOTE — Progress Notes (Signed)
Chart reviewed  no further recommendations at this time--Synthroid has been discontinued by my partner given likely acute illness causing issues -Please contact me directly if further issues  Signing off  Verneita Griffes, MD [facility] 11:32 AM
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
## 2019-07-01 NOTE — Progress Notes (Signed)
RT note-Ventilator setting changed per Dr. Tomasita Morrow, 6cc/kg, sputum obtained, trying to achieve 2:1 ratio, patient better sedated at this time, will repeat ABG in one hour.

## 2019-07-02 ENCOUNTER — Inpatient Hospital Stay (HOSPITAL_COMMUNITY): Payer: Medicaid - Out of State

## 2019-07-02 ENCOUNTER — Encounter: Payer: Self-pay | Admitting: *Deleted

## 2019-07-02 ENCOUNTER — Other Ambulatory Visit: Payer: Self-pay | Admitting: Neurological Surgery

## 2019-07-02 DIAGNOSIS — Z515 Encounter for palliative care: Secondary | ICD-10-CM

## 2019-07-02 DIAGNOSIS — S0990XS Unspecified injury of head, sequela: Secondary | ICD-10-CM

## 2019-07-02 DIAGNOSIS — Z9911 Dependence on respirator [ventilator] status: Secondary | ICD-10-CM

## 2019-07-02 DIAGNOSIS — S0990XA Unspecified injury of head, initial encounter: Secondary | ICD-10-CM

## 2019-07-02 LAB — CBC
HCT: 31.1 % — ABNORMAL LOW (ref 39.0–52.0)
Hemoglobin: 9.4 g/dL — ABNORMAL LOW (ref 13.0–17.0)
MCH: 28.5 pg (ref 26.0–34.0)
MCHC: 30.2 g/dL (ref 30.0–36.0)
MCV: 94.2 fL (ref 80.0–100.0)
Platelets: 348 10*3/uL (ref 150–400)
RBC: 3.3 MIL/uL — ABNORMAL LOW (ref 4.22–5.81)
RDW: 13.3 % (ref 11.5–15.5)
WBC: 26.3 10*3/uL — ABNORMAL HIGH (ref 4.0–10.5)
nRBC: 0.2 % (ref 0.0–0.2)

## 2019-07-02 LAB — GLUCOSE, CAPILLARY
Glucose-Capillary: 125 mg/dL — ABNORMAL HIGH (ref 70–99)
Glucose-Capillary: 118 mg/dL — ABNORMAL HIGH (ref 70–99)
Glucose-Capillary: 118 mg/dL — ABNORMAL HIGH (ref 70–99)
Glucose-Capillary: 118 mg/dL — ABNORMAL HIGH (ref 70–99)
Glucose-Capillary: 135 mg/dL — ABNORMAL HIGH (ref 70–99)
Glucose-Capillary: 105 mg/dL — ABNORMAL HIGH (ref 70–99)

## 2019-07-02 LAB — BASIC METABOLIC PANEL
Anion gap: 8 (ref 5–15)
BUN: 15 mg/dL (ref 6–20)
CO2: 36 mmol/L — ABNORMAL HIGH (ref 22–32)
Calcium: 8.3 mg/dL — ABNORMAL LOW (ref 8.9–10.3)
Chloride: 103 mmol/L (ref 98–111)
Creatinine, Ser: 0.64 mg/dL (ref 0.61–1.24)
GFR calc Af Amer: >60 mL/min (ref >60)
GFR calc non Af Amer: >60 mL/min (ref >60)
Glucose, Bld: 137 mg/dL — ABNORMAL HIGH (ref 70–99)
Potassium: 3.8 mmol/L (ref 3.5–5.1)
Sodium: 147 mmol/L — ABNORMAL HIGH (ref 135–145)

## 2019-07-02 MED ORDER — MIDAZOLAM HCL 2 MG/2ML IJ SOLN
2.0000 mg | INTRAMUSCULAR | Status: DC | PRN
  Administered 2019-07-02 – 2019-07-05 (×7): 2 mg via INTRAVENOUS
  Filled 2019-07-02 (×7): qty 2

## 2019-07-02 MED ORDER — LEVOFLOXACIN IN D5W 750 MG/150ML IV SOLN
750.0000 mg | INTRAVENOUS | Status: DC
  Administered 2019-07-02 – 2019-07-03 (×2): 750 mg via INTRAVENOUS
  Filled 2019-07-02 (×2): qty 150

## 2019-07-02 MED ORDER — FREE WATER
200.0000 mL | Freq: Three times a day (TID) | Status: DC
  Administered 2019-07-02 – 2019-07-06 (×11): 200 mL

## 2019-07-02 MED FILL — Sodium Chloride IV Soln 0.9%: INTRAVENOUS | Qty: 250 | Status: AC

## 2019-07-02 MED FILL — Fentanyl Citrate Preservative Free (PF) Inj 2500 MCG/50ML: INTRAMUSCULAR | Qty: 50 | Status: AC
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
## 2019-07-02 NOTE — Progress Notes (Signed)
Patient ID: Jay Cole, male   DOB: 08/14/1995, 24 y.o.   MRN: [id number] I met with his parents and Stanton Kidney from Palliative care at the bedside. I updated them on Mylen's condition including small improvements in his lung function and neuro exam. I also discussed the CT head from today. He remains very low level from a neuro standpoint. Provided he is not brain dead, his parents want to continue full support at this time. This includes further surgery as indicated. Dr. Ellene Route will be speaking with them shortly as well. I updated CDS who is here on the unit.  Georganna Skeans, MD, MPH, FACS Please use [email or web address] to contact on call provider
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
## 2019-07-02 NOTE — Progress Notes (Signed)
Patient ID: Jay Cole, male   DOB: October 05, 1995, 24 y.o.   MRN: [id number] CT of head today reviewed demonstrates that there is substantial devitalized brain in the left frontal parietal regions.  The rest of his brain including even the bulk of the basal ganglia on the left side and the midbrain and brainstem structures appear to be intact.  He does have brainstem reflexes but he does decorticate now to painful stimulation.  Though there is significant damage to the left frontal and parietal lobes over the region of the cortex the subcortical structures remain intact and are likely to recover.  I discussed these findings with the patient's family and they note that as long as he is not likely to become brain dead but the degree of injury in his dominant hemisphere is substantial and his functional status as he survives may be significantly diminished.  They expressed their desire that as long as there is hope for life that he be supported fully.  In that regard I have advised that he will require further surgery to debulk the devitalized brain in the left frontal and parietal lobes to allow less tense closure on his dominant hemisphere.  We will plan the surgery for Thursday.

## 2019-07-02 NOTE — Progress Notes (Signed)
The mother of the patient requested the chaplain since the patient's pastor could not.  The chaplain provided prayer and anointed the patient and will continue to pray for the patient and their family. The chaplain is available for follow-up if needed.  Brion Aliment Chaplain Resident For questions concerning this note please contact me by pager [phone]

## 2019-07-03 ENCOUNTER — Inpatient Hospital Stay (HOSPITAL_COMMUNITY): Payer: Medicaid - Out of State

## 2019-07-03 LAB — POCT I-STAT 7, (LYTES, BLD GAS, ICA,H+H)
Acid-Base Excess: 9 mmol/L — ABNORMAL HIGH (ref 0.0–2.0)
Acid-Base Excess: 9 mmol/L — ABNORMAL HIGH (ref 0.0–2.0)
Bicarbonate: 35.5 mmol/L — ABNORMAL HIGH (ref 20.0–28.0)
Bicarbonate: 35.3 mmol/L — ABNORMAL HIGH (ref 20.0–28.0)
Calcium, Ion: 1.14 mmol/L — ABNORMAL LOW (ref 1.15–1.40)
Calcium, Ion: 1.13 mmol/L — ABNORMAL LOW (ref 1.15–1.40)
HCT: 29 % — ABNORMAL LOW (ref 39.0–52.0)
HCT: 28 % — ABNORMAL LOW (ref 39.0–52.0)
Hemoglobin: 9.9 g/dL — ABNORMAL LOW (ref 13.0–17.0)
Hemoglobin: 9.5 g/dL — ABNORMAL LOW (ref 13.0–17.0)
O2 Saturation: 88 %
O2 Saturation: 91 %
Patient temperature: 98
Patient temperature: 97.7
Potassium: 3.5 mmol/L (ref 3.5–5.1)
Potassium: 3.3 mmol/L — ABNORMAL LOW (ref 3.5–5.1)
Sodium: 143 mmol/L (ref 135–145)
Sodium: 142 mmol/L (ref 135–145)
TCO2: 37 mmol/L — ABNORMAL HIGH (ref 22–32)
TCO2: 37 mmol/L — ABNORMAL HIGH (ref 22–32)
pCO2 arterial: 54.9 mmHg — ABNORMAL HIGH (ref 32.0–48.0)
pCO2 arterial: 53 mmHg — ABNORMAL HIGH (ref 32.0–48.0)
pH, Arterial: 7.418 (ref 7.350–7.450)
pH, Arterial: 7.43 (ref 7.350–7.450)
pO2, Arterial: 55 mmHg — ABNORMAL LOW (ref 83.0–108.0)
pO2, Arterial: 59 mmHg — ABNORMAL LOW (ref 83.0–108.0)

## 2019-07-03 LAB — GLUCOSE, CAPILLARY
Glucose-Capillary: 107 mg/dL — ABNORMAL HIGH (ref 70–99)
Glucose-Capillary: 111 mg/dL — ABNORMAL HIGH (ref 70–99)
Glucose-Capillary: 114 mg/dL — ABNORMAL HIGH (ref 70–99)
Glucose-Capillary: 137 mg/dL — ABNORMAL HIGH (ref 70–99)

## 2019-07-03 LAB — CBC
HCT: 30.4 % — ABNORMAL LOW (ref 39.0–52.0)
Hemoglobin: 9.3 g/dL — ABNORMAL LOW (ref 13.0–17.0)
MCH: 28.7 pg (ref 26.0–34.0)
MCHC: 30.6 g/dL (ref 30.0–36.0)
MCV: 93.8 fL (ref 80.0–100.0)
Platelets: 391 10*3/uL (ref 150–400)
RBC: 3.24 MIL/uL — ABNORMAL LOW (ref 4.22–5.81)
RDW: 13.4 % (ref 11.5–15.5)
WBC: 21.3 10*3/uL — ABNORMAL HIGH (ref 4.0–10.5)
nRBC: 0.2 % (ref 0.0–0.2)

## 2019-07-03 LAB — BASIC METABOLIC PANEL
Anion gap: 11 (ref 5–15)
BUN: 14 mg/dL (ref 6–20)
CO2: 32 mmol/L (ref 22–32)
Calcium: 8.2 mg/dL — ABNORMAL LOW (ref 8.9–10.3)
Chloride: 100 mmol/L (ref 98–111)
Creatinine, Ser: 0.47 mg/dL — ABNORMAL LOW (ref 0.61–1.24)
GFR calc Af Amer: >60 mL/min (ref >60)
GFR calc non Af Amer: >60 mL/min (ref >60)
Glucose, Bld: 138 mg/dL — ABNORMAL HIGH (ref 70–99)
Potassium: 3.5 mmol/L (ref 3.5–5.1)
Sodium: 143 mmol/L (ref 135–145)

## 2019-07-03 MED ORDER — POTASSIUM CHLORIDE 20 MEQ/15ML (10%) PO SOLN
40.0000 meq | Freq: Every day | ORAL | Status: DC
  Administered 2019-07-03 – 2019-07-11 (×8): 40 meq
  Filled 2019-07-03 (×6): qty 30

## 2019-07-03 MED ORDER — BETHANECHOL CHLORIDE 10 MG PO TABS
25.0000 mg | ORAL_TABLET | Freq: Three times a day (TID) | ORAL | Status: DC
  Filled 2019-07-03: qty 3

## 2019-07-03 MED ORDER — CEFAZOLIN SODIUM-DEXTROSE 2-4 GM/100ML-% IV SOLN
2.0000 g | Freq: Three times a day (TID) | INTRAVENOUS | Status: DC
  Administered 2019-07-04 – 2019-07-05 (×5): 2 g via INTRAVENOUS
  Filled 2019-07-03 (×6): qty 100

## 2019-07-03 MED ORDER — BETHANECHOL CHLORIDE 10 MG PO TABS
25.0000 mg | ORAL_TABLET | Freq: Three times a day (TID) | ORAL | Status: DC
  Administered 2019-07-03 – 2019-07-31 (×81): 25 mg
  Filled 2019-07-03 (×15): qty 3
  Filled 2019-07-03: qty 2.5
  Filled 2019-07-03 (×14): qty 3
  Filled 2019-07-03: qty 2.5
  Filled 2019-07-03 (×11): qty 3
  Filled 2019-07-03: qty 2.5
  Filled 2019-07-03 (×15): qty 3
  Filled 2019-07-03: qty 2.5
  Filled 2019-07-03 (×2): qty 3
  Filled 2019-07-03: qty 2.5
  Filled 2019-07-03 (×4): qty 3
  Filled 2019-07-03: qty 2.5
  Filled 2019-07-03 (×17): qty 3

## 2019-07-03 MED ORDER — POTASSIUM CHLORIDE 20 MEQ/15ML (10%) PO SOLN
40.0000 meq | Freq: Once | ORAL | Status: DC
  Filled 2019-07-03: qty 30

## 2019-07-03 NOTE — Progress Notes (Signed)
Assisted tele visit to patient with family member.  Trayvon Trumbull Anderson, RN   
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
## 2019-07-04 ENCOUNTER — Inpatient Hospital Stay (HOSPITAL_COMMUNITY): Payer: Medicaid - Out of State

## 2019-07-04 ENCOUNTER — Encounter (HOSPITAL_COMMUNITY): Payer: Self-pay

## 2019-07-04 ENCOUNTER — Encounter (HOSPITAL_COMMUNITY): Admission: EM | Disposition: E | Payer: Self-pay | Source: Home / Self Care

## 2019-07-04 DIAGNOSIS — M7989 Other specified soft tissue disorders: Secondary | ICD-10-CM

## 2019-07-04 LAB — BASIC METABOLIC PANEL
Anion gap: 12 (ref 5–15)
BUN: 12 mg/dL (ref 6–20)
CO2: 29 mmol/L (ref 22–32)
Calcium: 7.8 mg/dL — ABNORMAL LOW (ref 8.9–10.3)
Chloride: 101 mmol/L (ref 98–111)
Creatinine, Ser: 0.41 mg/dL — ABNORMAL LOW (ref 0.61–1.24)
GFR calc Af Amer: >60 mL/min (ref >60)
GFR calc non Af Amer: >60 mL/min (ref >60)
Glucose, Bld: 112 mg/dL — ABNORMAL HIGH (ref 70–99)
Potassium: 4 mmol/L (ref 3.5–5.1)
Sodium: 142 mmol/L (ref 135–145)

## 2019-07-04 LAB — GLUCOSE, CAPILLARY
Glucose-Capillary: 90 mg/dL (ref 70–99)
Glucose-Capillary: 111 mg/dL — ABNORMAL HIGH (ref 70–99)
Glucose-Capillary: 135 mg/dL — ABNORMAL HIGH (ref 70–99)
Glucose-Capillary: 125 mg/dL — ABNORMAL HIGH (ref 70–99)

## 2019-07-04 LAB — TRIGLYCERIDES: Triglycerides: 290 mg/dL — ABNORMAL HIGH (ref <150)

## 2019-07-04 SURGERY — CRANIOTOMY TUMOR EXCISION
Anesthesia: General

## 2019-07-04 MED ORDER — FUROSEMIDE 10 MG/ML IJ SOLN
40.0000 mg | Freq: Once | INTRAMUSCULAR | Status: AC
  Administered 2019-07-04: 40 mg via INTRAVENOUS
  Filled 2019-07-04: qty 4

## 2019-07-04 MED ORDER — SODIUM CHLORIDE 0.9 % IV SOLN
INTRAVENOUS | Status: DC | PRN
  Administered 2019-07-04: 23:00:00 500 mL via INTRAVENOUS
  Administered 2019-07-05: 250 mL via INTRAVENOUS
  Administered 2019-07-24 – 2019-08-05 (×2): 500 mL via INTRAVENOUS
  Administered 2019-08-17: 23:00:00 1000 mL via INTRAVENOUS
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
## 2019-07-04 NOTE — Progress Notes (Signed)
Patient ID: Jay Cole, male   DOB: 11-Apr-1996, 24 y.o.   MRN: [id number] Patient still requiring high FiO2's and PEEP to maintain modest oxygenation.  Clinically unchanged with some slightly purposeful movements of his left side right side appears to have some flexor or extensor posturing.  Pupils remain small and equal.

## 2019-07-04 NOTE — Progress Notes (Signed)
Bilateral lower extremity venous duplex has been completed. Preliminary results can be found in CV Proc through chart review.   07/13/2019 11:06 AM Jay Cole RVT

## 2019-07-04 NOTE — Progress Notes (Signed)
Patient ID: Jay Cole, male   DOB: 1995-08-07, 23 y.o.   MRN: [id number] Contacted by Dr. Georganna Skeans who knows that the patient is requiring 80% oxygen for ventilator in addition to 10 of PEEP and pulmonary status is critical.  We will hold off prevention for the current time.

## 2019-07-04 NOTE — Progress Notes (Signed)
Assisted tele visit to patient with father.  Rasul Decola R, RN   

## 2019-07-05 ENCOUNTER — Inpatient Hospital Stay (HOSPITAL_COMMUNITY): Payer: Medicaid - Out of State

## 2019-07-05 ENCOUNTER — Encounter (HOSPITAL_COMMUNITY): Payer: Self-pay | Admitting: Neurological Surgery

## 2019-07-05 LAB — URINALYSIS, ROUTINE W REFLEX MICROSCOPIC
Glucose, UA: NEGATIVE mg/dL
Hgb urine dipstick: NEGATIVE
Ketones, ur: NEGATIVE mg/dL
Leukocytes,Ua: NEGATIVE
Nitrite: NEGATIVE
Protein, ur: 30 mg/dL — AB
Specific Gravity, Urine: 1.036 — ABNORMAL HIGH (ref 1.005–1.030)
pH: 5 (ref 5.0–8.0)

## 2019-07-05 LAB — CBC
HCT: 27.8 % — ABNORMAL LOW (ref 39.0–52.0)
Hemoglobin: 9.2 g/dL — ABNORMAL LOW (ref 13.0–17.0)
MCH: 30.4 pg (ref 26.0–34.0)
MCHC: 33.1 g/dL (ref 30.0–36.0)
MCV: 91.7 fL (ref 80.0–100.0)
Platelets: 490 10*3/uL — ABNORMAL HIGH (ref 150–400)
RBC: 3.03 MIL/uL — ABNORMAL LOW (ref 4.22–5.81)
RDW: 13.2 % (ref 11.5–15.5)
WBC: 21.1 10*3/uL — ABNORMAL HIGH (ref 4.0–10.5)
nRBC: 0.6 % — ABNORMAL HIGH (ref 0.0–0.2)

## 2019-07-05 LAB — BASIC METABOLIC PANEL
Anion gap: 11 (ref 5–15)
BUN: 16 mg/dL (ref 6–20)
CO2: 30 mmol/L (ref 22–32)
Calcium: 7.7 mg/dL — ABNORMAL LOW (ref 8.9–10.3)
Chloride: 98 mmol/L (ref 98–111)
Creatinine, Ser: 0.48 mg/dL — ABNORMAL LOW (ref 0.61–1.24)
GFR calc Af Amer: >60 mL/min (ref >60)
GFR calc non Af Amer: >60 mL/min (ref >60)
Glucose, Bld: 115 mg/dL — ABNORMAL HIGH (ref 70–99)
Potassium: 3.9 mmol/L (ref 3.5–5.1)
Sodium: 139 mmol/L (ref 135–145)

## 2019-07-05 LAB — POCT I-STAT 7, (LYTES, BLD GAS, ICA,H+H)
Acid-Base Excess: 9 mmol/L — ABNORMAL HIGH (ref 0.0–2.0)
Acid-Base Excess: 9 mmol/L — ABNORMAL HIGH (ref 0.0–2.0)
Bicarbonate: 35.3 mmol/L — ABNORMAL HIGH (ref 20.0–28.0)
Bicarbonate: 34.7 mmol/L — ABNORMAL HIGH (ref 20.0–28.0)
Calcium, Ion: 1.14 mmol/L — ABNORMAL LOW (ref 1.15–1.40)
Calcium, Ion: 1.16 mmol/L (ref 1.15–1.40)
HCT: 30 % — ABNORMAL LOW (ref 39.0–52.0)
HCT: 26 % — ABNORMAL LOW (ref 39.0–52.0)
Hemoglobin: 10.2 g/dL — ABNORMAL LOW (ref 13.0–17.0)
Hemoglobin: 8.8 g/dL — ABNORMAL LOW (ref 13.0–17.0)
O2 Saturation: 90 %
O2 Saturation: 86 %
Patient temperature: 99
Patient temperature: 102.6
Potassium: 4.1 mmol/L (ref 3.5–5.1)
Potassium: 4.3 mmol/L (ref 3.5–5.1)
Sodium: 141 mmol/L (ref 135–145)
Sodium: 140 mmol/L (ref 135–145)
TCO2: 37 mmol/L — ABNORMAL HIGH (ref 22–32)
TCO2: 36 mmol/L — ABNORMAL HIGH (ref 22–32)
pCO2 arterial: 54.6 mmHg — ABNORMAL HIGH (ref 32.0–48.0)
pCO2 arterial: 62.8 mmHg — ABNORMAL HIGH (ref 32.0–48.0)
pH, Arterial: 7.42 (ref 7.350–7.450)
pH, Arterial: 7.36 (ref 7.350–7.450)
pO2, Arterial: 60 mmHg — ABNORMAL LOW (ref 83.0–108.0)
pO2, Arterial: 62 mmHg — ABNORMAL LOW (ref 83.0–108.0)

## 2019-07-05 LAB — BLOOD GAS, ARTERIAL
Acid-Base Excess: 4.5 mmol/L — ABNORMAL HIGH (ref 0.0–2.0)
Bicarbonate: 33.1 mmol/L — ABNORMAL HIGH (ref 20.0–28.0)
Drawn by: 53547
FIO2: 100
O2 Saturation: 93.3 %
Patient temperature: 39.2
pCO2 arterial: 114 mmHg (ref 32.0–48.0)
pH, Arterial: 7.107 — CL (ref 7.350–7.450)
pO2, Arterial: 106 mmHg (ref 83.0–108.0)

## 2019-07-05 LAB — GLUCOSE, CAPILLARY
Glucose-Capillary: 123 mg/dL — ABNORMAL HIGH (ref 70–99)
Glucose-Capillary: 120 mg/dL — ABNORMAL HIGH (ref 70–99)
Glucose-Capillary: 117 mg/dL — ABNORMAL HIGH (ref 70–99)
Glucose-Capillary: 166 mg/dL — ABNORMAL HIGH (ref 70–99)
Glucose-Capillary: 85 mg/dL (ref 70–99)

## 2019-07-05 MED ORDER — FUROSEMIDE 10 MG/ML IJ SOLN
40.0000 mg | Freq: Two times a day (BID) | INTRAMUSCULAR | Status: DC
  Administered 2019-07-06: 40 mg via INTRAVENOUS
  Filled 2019-07-05: qty 4

## 2019-07-05 MED ORDER — VECURONIUM BROMIDE 10 MG IV SOLR: 10.0000 mg | Freq: Once | INTRAVENOUS | Status: AC

## 2019-07-05 MED ORDER — IPRATROPIUM-ALBUTEROL 0.5-2.5 (3) MG/3ML IN SOLN
3.0000 mL | Freq: Four times a day (QID) | RESPIRATORY_TRACT | Status: DC
  Administered 2019-07-05: 3 mL via RESPIRATORY_TRACT
  Filled 2019-07-05: qty 3

## 2019-07-05 MED ORDER — VECURONIUM BROMIDE 10 MG IV SOLR
INTRAVENOUS | Status: AC
  Administered 2019-07-05: 10 mg via INTRAVENOUS
  Filled 2019-07-05: qty 10

## 2019-07-05 MED ORDER — SODIUM CHLORIDE 0.9 % IV SOLN
INTRAVENOUS | Status: DC | PRN
  Administered 2019-07-05 – 2019-07-18 (×4): 250 mL via INTRAVENOUS
  Administered 2019-07-23: 13:00:00 500 mL via INTRAVENOUS

## 2019-07-05 MED ORDER — VECURONIUM BROMIDE 10 MG IV SOLR
10.0000 mg | INTRAVENOUS | Status: DC | PRN
  Filled 2019-07-05: qty 10

## 2019-07-05 MED ORDER — SODIUM CHLORIDE 0.9 % IV SOLN: INTRAVENOUS | Status: DC | PRN

## 2019-07-05 MED ORDER — FUROSEMIDE 10 MG/ML IJ SOLN
40.0000 mg | Freq: Once | INTRAMUSCULAR | Status: AC
  Administered 2019-07-05: 40 mg via INTRAVENOUS
  Filled 2019-07-05: qty 4

## 2019-07-05 MED ORDER — MIDAZOLAM 50MG/50ML (1MG/ML) PREMIX INFUSION
2.0000 mg/h | INTRAVENOUS | Status: DC
  Administered 2019-07-05: 10 mg/h via INTRAVENOUS
  Administered 2019-07-05: 17:00:00 2 mg/h via INTRAVENOUS
  Administered 2019-07-06 – 2019-07-08 (×10): 10 mg/h via INTRAVENOUS
  Filled 2019-07-05 (×14): qty 50

## 2019-07-05 MED ORDER — ACETAMINOPHEN 325 MG PO TABS
650.0000 mg | ORAL_TABLET | ORAL | Status: DC | PRN
  Filled 2019-07-05 (×2): qty 2

## 2019-07-05 MED ORDER — ARTIFICIAL TEARS OPHTHALMIC OINT
1.0000 "application " | TOPICAL_OINTMENT | Freq: Three times a day (TID) | OPHTHALMIC | Status: DC
  Administered 2019-07-05 – 2019-07-12 (×17): 1 via OPHTHALMIC
  Filled 2019-07-05 (×2): qty 3.5

## 2019-07-05 MED ORDER — VANCOMYCIN HCL 1750 MG/350ML IV SOLN
1750.0000 mg | Freq: Three times a day (TID) | INTRAVENOUS | Status: DC
  Administered 2019-07-05 – 2019-07-08 (×8): 1750 mg via INTRAVENOUS
  Filled 2019-07-05 (×9): qty 350

## 2019-07-05 MED ORDER — VECURONIUM BROMIDE 10 MG IV SOLR
0.0000 ug/kg/min | INTRAVENOUS | Status: DC
  Administered 2019-07-05 – 2019-07-06 (×2): 1 ug/kg/min via INTRAVENOUS
  Administered 2019-07-06 – 2019-07-07 (×3): 1.1 ug/kg/min via INTRAVENOUS
  Administered 2019-07-08: 1.103 ug/kg/min via INTRAVENOUS
  Filled 2019-07-05 (×8): qty 100

## 2019-07-05 MED ORDER — MIDAZOLAM BOLUS VIA INFUSION
1.0000 mg | INTRAVENOUS | Status: DC | PRN
  Filled 2019-07-05: qty 2

## 2019-07-05 MED ORDER — VANCOMYCIN HCL 2000 MG/400ML IV SOLN
2000.0000 mg | Freq: Once | INTRAVENOUS | Status: AC
  Administered 2019-07-05: 15:00:00 2000 mg via INTRAVENOUS
  Filled 2019-07-05: qty 400

## 2019-07-05 MED ORDER — SODIUM CHLORIDE 0.9 % IV SOLN
1.0000 g | Freq: Three times a day (TID) | INTRAVENOUS | Status: AC
  Administered 2019-07-05 – 2019-07-11 (×19): 1 g via INTRAVENOUS
  Filled 2019-07-05 (×20): qty 1

## 2019-07-05 NOTE — Progress Notes (Signed)
RT done recruitment maneuver at 1200 ventilator check per MD order. Will continue again next rounds. Patients sats at 97% after recruitment.

## 2019-07-05 NOTE — Progress Notes (Signed)
Spoke with Jerene Pitch regarding PICC exchange order.  Informed that TL PICCs are currently on national backorder and therefore unable to exchange the DL for TL.  Brooke to inform MD and enter consult for additional IV start for access needed.  Carolee Rota, RN

## 2019-07-05 NOTE — Progress Notes (Signed)
Pharmacy Antibiotic Note Jay Cole is a 24 y.o. male admitted on 07/05/2019 with pneumonia.  Pharmacy has been consulted for Vancomycin dosing.  WBC elevated and stable at 21.1. Lactic acid 3.2.  Patient continues to spike fevers - T-max 101.7.  SCr stable. UOP up at 1.2.   Plan: Vancomycin 2 g IV x 1 then 1750mg  IV every 8 hours.  Goal AUC 400-550. Expected AUC: 497 SCr used: 0.48 Monitor renal function, clinical status, and culture results.  Will plan to check level prior to 4th dose if maintains on therapy.   Height: 5\' 7"  (170.2 cm) Weight: 274 lb 14.6 oz (124.7 kg) IBW/kg (Calculated) : 66.1  Temp (24hrs), Avg:99.6 F (37.6 C), Min:98.1 F (36.7 C), Max:101.7 F (38.7 C)  Recent Labs  Lab 06/30/19 0328 06/30/19 0328 07/01/19 0446 07/02/19 0413 07/03/19 0426 06/25/2019 0925 07/05/19 0407  WBC 19.0*  --  25.6* 26.3* 21.3*  --  21.1*  CREATININE 0.49*   < > 0.58* 0.64 0.47* 0.41* 0.48*   < > = values in this interval not displayed.    Estimated Creatinine Clearance: 184.9 mL/min (A) (by C-G formula based on SCr of 0.48 mg/dL (L)).    Allergies reviewed.   Antimicrobials this admission: Levaquin 1/12 >> 1/13 Cefazolin 1/14 >> Vancomycin 1/15 >>  Dose adjustments this admission:   Microbiology results: 1/4 Covid / flu - negative 1/5 MRSA PCR - negative 1/11 TA - mssa 1/11 Bld - ngtd 1/15 Resp Cx >> 1/15 BCx >>   Thank you for allowing pharmacy to be a part of this patient's care.  Sloan Leiter, PharmD, BCPS, BCCCP Clinical Pharmacist Please refer to [facility] for [place] numbers 07/05/2019 1:37 PM

## 2019-07-05 NOTE — Progress Notes (Signed)
I was consulted by a Dr. Dema Severin and a Dr. Bobbye Morton has unconsulted me approximately 20 minutes later.  Erskine Emery MD PCCM

## 2019-07-05 NOTE — Progress Notes (Signed)
Pt unproned with CCM, RN's, and RT's at bedside. Vent changes being made.
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
## 2019-07-05 NOTE — Progress Notes (Signed)
MD at bedside. PEEP changed from 10 to 12 and will plan to prone patient later today.

## 2019-07-05 NOTE — Plan of Care (Signed)
Consulted to see for vent management by covering trauma physician Dr. Dema Severin.  Subsequently discussed with Dr. Bobbye Morton (trauma) in unit, who will continue to manage the patient's vent.  PCCM will not consult for vent management at this time. Please engage PCCM if needed.     Eliseo Gum MSN, AGACNP-BC [place] [phone] If no answer, [phone] 07/05/2019, 7:03 PM

## 2019-07-05 NOTE — Progress Notes (Signed)
RT obtained ABG on pt with the following results. No changes at this time. RT will continue to monitor.   Results for Jay Cole, Jay Cole (MRN [id number]) as of 07/05/2019 06:25  Ref. Range 07/05/2019 04:43  Sample type Unknown ARTERIAL  pH, Arterial Latest Ref Range: 7.350 - 7.450  7.420  pCO2 arterial Latest Ref Range: 32.0 - 48.0 mmHg 54.6 (H)  pO2, Arterial Latest Ref Range: 83.0 - 108.0 mmHg 60.0 (L)  TCO2 Latest Ref Range: 22 - 32 mmol/L 37 (H)  Acid-Base Excess Latest Ref Range: 0.0 - 2.0 mmol/L 9.0 (H)  Bicarbonate Latest Ref Range: 20.0 - 28.0 mmol/L 35.3 (H)  O2 Saturation Latest Units: % 90.0  Patient temperature Unknown 99.0 F  Collection site Unknown BRACHIAL ARTERY

## 2019-07-05 NOTE — Procedures (Signed)
   Procedure Note  Date: 07/05/2019  Procedure: central venous catheter placement--right, internal jugular vein, with ultrasound guidance  Pre-op diagnosis: inadequate IV access Post-op diagnosis: same  Surgeon: [name], MD  Anesthesia: local  EBL: <5cc Drains/Implants: 20 cm, triple lumen central venous catheter  Description of procedure: Time-out was performed verifying correct patient, procedure, site, laterality, and signature of informed consent. The right neck was prepped and draped in the usual sterile fashion. The internal jugular vein was localized with ultrasound guidance. Five ccs of local anesthetic was infiltrated at the site of venous access. The internal jugular vein was accessed using an introducer needle and a guidewire passed through the needle. The needle was removed and a skin nick was made. The tract was dilated and the central venous catheter advanced over the guidewire followed by removal of the guidewire. All ports drew blood easily and all were flushed with saline. The catheter was secured to the skin with suture and a sterile dressing. The patient tolerated the procedure well. There were no immediate complications. Follow up chest x-ray was ordered to confirm positioning and the absence of a pneumothorax.   [name], MD General and [place] [facility]

## 2019-07-05 NOTE — Procedures (Signed)
Arterial Catheter Insertion Procedure Note Jay Cole [id number] 12-May-1996  Procedure: Insertion of Arterial Catheter  Indications: Blood pressure monitoring and Frequent blood sampling  Procedure Details Consent: Unable to obtain consent because of on ventilator. Time Out: Verified patient identification, verified procedure, site/side was marked, verified correct patient position, special equipment/implants available, medications/allergies/relevent history reviewed, required imaging and test results available.  Performed  Maximum sterile technique was used including antiseptics, cap, gloves, gown, hand hygiene, mask and sheet. Skin prep: Chlorhexidine; local anesthetic administered 20 gauge catheter was inserted into left radial artery using the Seldinger technique. ULTRASOUND GUIDANCE USED: NO Evaluation Blood flow good; BP tracing good. Complications: No apparent complications.   Judith Part 07/05/2019

## 2019-07-05 NOTE — Progress Notes (Signed)
Called Dr. Bobbye Morton re: blood gas. Verbal order to increase resp rate to 30 and draw ABG in one hour.

## 2019-07-05 NOTE — Progress Notes (Signed)
RT obtained ABG on pt per order. RT contacted MD Lovick with results per her request. No changes at this time. MD okay with recruitments on pt and lavage suctioning to improve oxygenation. RT will continue to monitor.

## 2019-07-05 NOTE — Progress Notes (Signed)
Attempts made to wean sedation (Fentanyl and Propofol) which are running at maximum rate. After weaning down slightly, pt developed tachycardia to the 170s, tachypnea to the 50s, and desaturations to the 80s with even minor stimulation. Discussed with Dr. Grandville Silos. Put back on full sedation with bolus of Versed given. Vitals slowly returning to baseline. Will not reattempt sedation wean tonight.

## 2019-07-05 NOTE — Progress Notes (Signed)
Patient ID: Jay Cole, male   DOB: 05-Aug-1995, 24 y.o.   MRN: [id number] Clinically without change still battling a pneumonia We will recheck in the beginning of the week to see if we can proceed with surgical debridement of devitalized brain .

## 2019-07-05 NOTE — Progress Notes (Addendum)
Vent settings adjustments over the course of the day still unable to get any lower than 70% FiO2. Currently on PCV 70%, PEEP 18, DP 8, RR24, I:E: 1.5:1. Will initate proning with 16h prone, 8h supine, re-assess after 24h for need to re-prone. Newly febrile today despite cefazolin for MSSA. D/w micro and RPh, Diphtheroid genus of GPRs on respcx from 1/11, will re-incubate and broadened to vanc. After broadening to vanc, febrile to 102.6, so will add meropenem. UA negative this AM, bcx and repeat respcx sent prior to broadening abx. Called mother to update and also to clarify code status, which mom states should be full code.   [name], MD General and [facility]

## 2019-07-05 NOTE — Progress Notes (Signed)
Patient proned with MD, 4 RN's, an 3RT's at bedside. ETT tube was secured with cloth tape and tube holder was removed. ETT tube is still lat 25 at the lip. Foam pads placed on face incase of skin breakdown. RN noted ulcer on tongue earlier from bite block that was removed, but no skin breakdown noted from face facing down. Patient was given a paralytic by RN. Patients vent settings are PC 16/26/+18/100%. MD orders are for keeping sats 88% and above, peak pressures <35, and target VT 6cc/kg. RT will pass on to next shift. Will obtain follow up ABG one hour after prone. RT will continue to monitor.

## 2019-07-06 ENCOUNTER — Inpatient Hospital Stay (HOSPITAL_COMMUNITY): Payer: Medicaid - Out of State

## 2019-07-06 LAB — POCT I-STAT 7, (LYTES, BLD GAS, ICA,H+H)
Acid-Base Excess: 10 mmol/L — ABNORMAL HIGH (ref 0.0–2.0)
Acid-Base Excess: 12 mmol/L — ABNORMAL HIGH (ref 0.0–2.0)
Acid-Base Excess: 16 mmol/L — ABNORMAL HIGH (ref 0.0–2.0)
Bicarbonate: 34.8 mmol/L — ABNORMAL HIGH (ref 20.0–28.0)
Bicarbonate: 36 mmol/L — ABNORMAL HIGH (ref 20.0–28.0)
Bicarbonate: 39.4 mmol/L — ABNORMAL HIGH (ref 20.0–28.0)
Calcium, Ion: 1.07 mmol/L — ABNORMAL LOW (ref 1.15–1.40)
Calcium, Ion: 1.07 mmol/L — ABNORMAL LOW (ref 1.15–1.40)
Calcium, Ion: 1.18 mmol/L (ref 1.15–1.40)
HCT: 24 % — ABNORMAL LOW (ref 39.0–52.0)
HCT: 28 % — ABNORMAL LOW (ref 39.0–52.0)
HCT: 36 % — ABNORMAL LOW (ref 39.0–52.0)
Hemoglobin: 12.2 g/dL — ABNORMAL LOW (ref 13.0–17.0)
Hemoglobin: 8.2 g/dL — ABNORMAL LOW (ref 13.0–17.0)
Hemoglobin: 9.5 g/dL — ABNORMAL LOW (ref 13.0–17.0)
O2 Saturation: 93 %
O2 Saturation: 95 %
O2 Saturation: 95 %
Patient temperature: 101.3
Patient temperature: 101.5
Patient temperature: 99.3
Potassium: 3.1 mmol/L — ABNORMAL LOW (ref 3.5–5.1)
Potassium: 3.2 mmol/L — ABNORMAL LOW (ref 3.5–5.1)
Potassium: 3.7 mmol/L (ref 3.5–5.1)
Sodium: 138 mmol/L (ref 135–145)
Sodium: 141 mmol/L (ref 135–145)
Sodium: 141 mmol/L (ref 135–145)
TCO2: 36 mmol/L — ABNORMAL HIGH (ref 22–32)
TCO2: 38 mmol/L — ABNORMAL HIGH (ref 22–32)
TCO2: 41 mmol/L — ABNORMAL HIGH (ref 22–32)
pCO2 arterial: 40 mmHg (ref 32.0–48.0)
pCO2 arterial: 42.5 mmHg (ref 32.0–48.0)
pCO2 arterial: 57 mmHg — ABNORMAL HIGH (ref 32.0–48.0)
pH, Arterial: 7.415 (ref 7.350–7.450)
pH, Arterial: 7.552 — ABNORMAL HIGH (ref 7.350–7.450)
pH, Arterial: 7.577 — ABNORMAL HIGH (ref 7.350–7.450)
pO2, Arterial: 64 mmHg — ABNORMAL LOW (ref 83.0–108.0)
pO2, Arterial: 66 mmHg — ABNORMAL LOW (ref 83.0–108.0)
pO2, Arterial: 85 mmHg (ref 83.0–108.0)

## 2019-07-06 LAB — GLUCOSE, CAPILLARY
Glucose-Capillary: 80 mg/dL (ref 70–99)
Glucose-Capillary: 136 mg/dL — ABNORMAL HIGH (ref 70–99)
Glucose-Capillary: 122 mg/dL — ABNORMAL HIGH (ref 70–99)
Glucose-Capillary: 159 mg/dL — ABNORMAL HIGH (ref 70–99)
Glucose-Capillary: 103 mg/dL — ABNORMAL HIGH (ref 70–99)
Glucose-Capillary: 122 mg/dL — ABNORMAL HIGH (ref 70–99)

## 2019-07-06 LAB — BASIC METABOLIC PANEL
Anion gap: 12 (ref 5–15)
BUN: 16 mg/dL (ref 6–20)
CO2: 30 mmol/L (ref 22–32)
Calcium: 8.2 mg/dL — ABNORMAL LOW (ref 8.9–10.3)
Chloride: 97 mmol/L — ABNORMAL LOW (ref 98–111)
Creatinine, Ser: 0.52 mg/dL — ABNORMAL LOW (ref 0.61–1.24)
GFR calc Af Amer: >60 mL/min (ref >60)
GFR calc non Af Amer: >60 mL/min (ref >60)
Glucose, Bld: 118 mg/dL — ABNORMAL HIGH (ref 70–99)
Potassium: 3.8 mmol/L (ref 3.5–5.1)
Sodium: 139 mmol/L (ref 135–145)

## 2019-07-06 LAB — CULTURE, BLOOD (ROUTINE X 2)
Culture: NO GROWTH
Culture: NO GROWTH
Special Requests: ADEQUATE
Special Requests: ADEQUATE

## 2019-07-06 LAB — CBC
HCT: 27.7 % — ABNORMAL LOW (ref 39.0–52.0)
Hemoglobin: 8.8 g/dL — ABNORMAL LOW (ref 13.0–17.0)
MCH: 28.3 pg (ref 26.0–34.0)
MCHC: 31.8 g/dL (ref 30.0–36.0)
MCV: 89.1 fL (ref 80.0–100.0)
Platelets: 538 10*3/uL — ABNORMAL HIGH (ref 150–400)
RBC: 3.11 MIL/uL — ABNORMAL LOW (ref 4.22–5.81)
RDW: 13.2 % (ref 11.5–15.5)
WBC: 20.9 10*3/uL — ABNORMAL HIGH (ref 4.0–10.5)
nRBC: 0.5 % — ABNORMAL HIGH (ref 0.0–0.2)

## 2019-07-06 LAB — TRIGLYCERIDES: Triglycerides: 375 mg/dL — ABNORMAL HIGH (ref <150)

## 2019-07-06 LAB — VANCOMYCIN, PEAK: Vancomycin Pk: 27 ug/mL — ABNORMAL LOW (ref 30–40)

## 2019-07-06 LAB — MAGNESIUM: Magnesium: 2.1 mg/dL (ref 1.7–2.4)

## 2019-07-06 LAB — VANCOMYCIN, TROUGH: Vancomycin Tr: 15 ug/mL (ref 15–20)

## 2019-07-06 LAB — PHOSPHORUS: Phosphorus: 2.9 mg/dL (ref 2.5–4.6)

## 2019-07-06 MED ORDER — LEVALBUTEROL HCL 0.63 MG/3ML IN NEBU
0.6300 mg | INHALATION_SOLUTION | Freq: Four times a day (QID) | RESPIRATORY_TRACT | Status: DC
  Administered 2019-07-06 – 2019-07-28 (×89): 0.63 mg via RESPIRATORY_TRACT
  Filled 2019-07-06 (×90): qty 3

## 2019-07-06 MED ORDER — IPRATROPIUM BROMIDE 0.02 % IN SOLN
0.5000 mg | Freq: Four times a day (QID) | RESPIRATORY_TRACT | Status: DC
  Administered 2019-07-06 – 2019-07-28 (×88): 0.5 mg via RESPIRATORY_TRACT
  Filled 2019-07-06 (×88): qty 2.5

## 2019-07-06 MED ORDER — FREE WATER
100.0000 mL | Freq: Three times a day (TID) | Status: DC
  Administered 2019-07-06 – 2019-08-11 (×106): 100 mL

## 2019-07-06 MED ORDER — FUROSEMIDE 10 MG/ML IJ SOLN
60.0000 mg | Freq: Two times a day (BID) | INTRAMUSCULAR | Status: DC
  Administered 2019-07-06 – 2019-07-09 (×6): 60 mg via INTRAVENOUS
  Filled 2019-07-06 (×7): qty 6

## 2019-07-06 MED ORDER — FUROSEMIDE 10 MG/ML IJ SOLN
40.0000 mg | Freq: Once | INTRAMUSCULAR | Status: AC
  Administered 2019-07-06: 40 mg via INTRAVENOUS
  Filled 2019-07-06: qty 4

## 2019-07-06 MED ORDER — HEPARIN SODIUM (PORCINE) 5000 UNIT/ML IJ SOLN
5000.0000 [IU] | Freq: Three times a day (TID) | INTRAMUSCULAR | Status: DC
  Administered 2019-07-06 – 2019-07-16 (×31): 5000 [IU] via SUBCUTANEOUS
  Filled 2019-07-06 (×31): qty 1

## 2019-07-06 NOTE — Progress Notes (Signed)
Subjective: The patient is prone, intubated and pharmacologically paralyzed.  Objective: Vital signs in last 24 hours: Temp:  [100 F (37.8 C)-102.7 F (39.3 C)] 102.4 F (39.1 C) (01/16 0400) Pulse Rate:  [108-144] 108 (01/16 0751) Resp:  [24-30] 30 (01/16 0751) BP: (112-188)/(46-81) 188/80 (01/16 0751) SpO2:  [87 %-100 %] 95 % (01/16 0751) Arterial Line BP: (84-187)/(60-83) 187/83 (01/16 0800) FiO2 (%):  [55 %-90 %] 80 % (01/16 0751) Weight:  [125.5 kg] 125.5 kg (01/16 0500) Estimated body mass index is 43.33 kg/m as calculated from the following:   Height as of this encounter: 5\' 7"  (1.702 m).   Weight as of this encounter: 125.5 kg.   Intake/Output from previous day: 01/15 0701 - 01/16 0700 In: 4512.9 [I.V.:2216.4; NG/GT:835; IV Piggyback:1461.6] Out: 3550 [Urine:3550] Intake/Output this shift: No intake/output data recorded.  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  Lab Results: Recent Labs    07/05/19 0407 07/05/19 0443 07/06/19 0410 07/06/19 0537  WBC 21.1*  --   --  20.9*  HGB 9.2*   < > 12.2* 8.8*  HCT 27.8*   < > 36.0* 27.7*  PLT 490*  --   --  538*   < > = values in this interval not displayed.   BMET Recent Labs    07/05/19 0407 07/05/19 0443 07/06/19 0410 07/06/19 0537  NA 139   < > 138 139  K 3.9   < > 3.7 3.8  CL 98  --   --  97*  CO2 30  --   --  30  GLUCOSE 115*  --   --  118*  BUN 16  --   --  16  CREATININE 0.48*  --   --  0.52*  CALCIUM 7.7*  --   --  8.2*   < > = values in this interval not displayed.    Studies/Results: DG CHEST PORT 1 VIEW  Result Date: 07/05/2019 CLINICAL DATA:  24 year old male with central line placement. EXAM: PORTABLE CHEST 1 VIEW COMPARISON:  Earlier radiograph dated 07/05/2019. FINDINGS: Right IJ central venous line with tip in the region of the cavoatrial junction. Right-sided PICC with tip over upper SVC. Endotracheal tube above the carina and enteric tube extending  beyond the inferior margin of the image. Shallow inspiration with bibasilar atelectasis, right greater than left. Small pleural effusion may be present. No pneumothorax. Stable cardiac silhouette. No acute osseous pathology. IMPRESSION: Interval placement of a right IJ central venous line with tip over central SVC. No pneumothorax. Electronically Signed   By: Anner Crete M.D.   On: 07/05/2019 20:42   DG Chest Port 1 View  Result Date: 07/05/2019 CLINICAL DATA:  24 year old male with ARDS. EXAM: PORTABLE CHEST 1 VIEW COMPARISON:  Chest radiograph dated 07/05/2019. FINDINGS: Endotracheal tube approximately 5.5 cm above the carina and enteric tube extending below the diaphragm with tip beyond the inferior margin of the image. Right-sided PICC with tip close to the cavoatrial junction. Shallow inspiration with bibasilar densities which may represent atelectasis or infiltrate. More confluent density in the right middle lobe may represent atelectasis or infiltrate. There is diffuse hazy density throughout the left lung. Small bilateral pleural effusion may be present. No pneumothorax. Stable cardiomediastinal silhouette. No acute osseous pathology. IMPRESSION: 1. More confluent area of density in the right middle lobe may represent atelectasis or infiltrate. Otherwise, no significant interval change in the appearance of the lungs. No pneumothorax. 2. Support apparatus in similar position.  Electronically Signed   By: Anner Crete M.D.   On: 07/05/2019 18:22   DG CHEST PORT 1 VIEW  Result Date: 07/05/2019 CLINICAL DATA:  ARDS EXAM: PORTABLE CHEST 1 VIEW COMPARISON:  2 days ago FINDINGS: Endotracheal tube tip is higher, now at the clavicular heads. Enteric tube at least reaches the stomach. Right PICC with tip at the upper SVC. Low volume chest with dense airspace opacity at both bases. The could be layering pleural fluid. No pneumothorax. No cardiac enlargement IMPRESSION: 1. Higher endotracheal tube but  still in good position. 2. Stable low volume chest with extensive airspace opacity. There could be layering pleural fluid. Electronically Signed   By: Monte Fantasia M.D.   On: 07/05/2019 07:06   VAS [place] LOWER EXTREMITY VENOUS (DVT)  Result Date: 07/15/2019  Lower Venous Study Indications: Swelling.  Risk Factors: Trauma. Limitations: Body habitus, poor ultrasound/tissue interface and patient immobility. Comparison Study: No prior studies. Performing Technologist: Oliver Hum RVT  Examination Guidelines: A complete evaluation includes B-mode imaging, spectral Doppler, color Doppler, and power Doppler as needed of all accessible portions of each vessel. Bilateral testing is considered an integral part of a complete examination. Limited examinations for reoccurring indications may be performed as noted.  +---------+---------------+---------+-----------+----------+--------------+ RIGHT    CompressibilityPhasicitySpontaneityPropertiesThrombus Aging +---------+---------------+---------+-----------+----------+--------------+ CFV      Full           Yes      Yes                                 +---------+---------------+---------+-----------+----------+--------------+ SFJ      Full                                                        +---------+---------------+---------+-----------+----------+--------------+ FV Prox  Full                                                        +---------+---------------+---------+-----------+----------+--------------+ FV Mid   Full                                                        +---------+---------------+---------+-----------+----------+--------------+ FV DistalFull                                                        +---------+---------------+---------+-----------+----------+--------------+ PFV      Full                                                         +---------+---------------+---------+-----------+----------+--------------+ POP      Full  Yes      Yes                                 +---------+---------------+---------+-----------+----------+--------------+ PTV      Full                                                        +---------+---------------+---------+-----------+----------+--------------+ PERO     Full                                                        +---------+---------------+---------+-----------+----------+--------------+   +---------+---------------+---------+-----------+----------+--------------+ LEFT     CompressibilityPhasicitySpontaneityPropertiesThrombus Aging +---------+---------------+---------+-----------+----------+--------------+ CFV      Full           Yes      Yes                                 +---------+---------------+---------+-----------+----------+--------------+ SFJ      Full                                                        +---------+---------------+---------+-----------+----------+--------------+ FV Prox  Full                                                        +---------+---------------+---------+-----------+----------+--------------+ FV Mid   Full                                                        +---------+---------------+---------+-----------+----------+--------------+ FV DistalFull                                                        +---------+---------------+---------+-----------+----------+--------------+ PFV      Full                                                        +---------+---------------+---------+-----------+----------+--------------+ POP      Full           Yes      Yes                                 +---------+---------------+---------+-----------+----------+--------------+ PTV  Full                                                         +---------+---------------+---------+-----------+----------+--------------+ PERO     Full                                                        +---------+---------------+---------+-----------+----------+--------------+     Summary: Right: There is no evidence of deep vein thrombosis in the lower extremity. No cystic structure found in the popliteal fossa. Left: There is no evidence of deep vein thrombosis in the lower extremity. No cystic structure found in the popliteal fossa.  *See table(s) above for measurements and observations. Electronically signed by Servando Snare MD on 07/11/2019 at 3:31:30 PM.    Final     Assessment/Plan: Postoperative day #8: The patient by report is doing poor neurological gait and posturing.  He has severe pulmonary disease.  We will continue with supportive care for now.  I think it is okay to start DVT prophylactics since surgery was 8 days ago.  LOS: 12 days     Ophelia Charter 07/06/2019, 9:09 AM

## 2019-07-06 NOTE — Progress Notes (Signed)
Pharmacy Antibiotic Note Jay Cole is a 24 y.o. male admitted on 07/05/2019 with pneumonia.  Pharmacy has been consulted for Vancomycin dosing.  Persistent fevers and leukocytosis.  Vancomycin levels were drawn early (prior to 3rd dose) in setting of obesity -- not at steady state.  Vancomycin trough therapeutic at 15, Peak (drawn late) on low side at 27 -- estimated AUC ~386 but this is if was steady state. Suspect will accumulate further.   Plan: Continue Vanocmycin 1750mg  IV every 8 hours.  Goal AUC 400-550. Expected AUC: 497 SCr used: 0.8 Monitor renal function, clinical status, and culture results.  Will plan to recheck levels if continues on therapy.   Height: 5\' 7"  (170.2 cm) Weight: 276 lb 10.8 oz (125.5 kg) IBW/kg (Calculated) : 66.1  Temp (24hrs), Avg:101.4 F (38.6 C), Min:100 F (37.8 C), Max:102.7 F (39.3 C)  Recent Labs  Lab 07/01/19 0446 07/01/19 0446 07/02/19 0413 07/03/19 0426 07/11/2019 0925 07/05/19 0407 07/06/19 0537 07/06/19 1045 07/06/19 1313  WBC 25.6*  --  26.3* 21.3*  --  21.1* 20.9*  --   --   CREATININE 0.58*   < > 0.64 0.47* 0.41* 0.48* 0.52*  --   --   VANCOTROUGH  --   --   --   --   --   --   --   --  15  VANCOPEAK  --   --   --   --   --   --   --  27*  --    < > = values in this interval not displayed.    Estimated Creatinine Clearance: 185.7 mL/min (A) (by C-G formula based on SCr of 0.52 mg/dL (L)).    Allergies reviewed.   Antimicrobials this admission: Levaquin 1/12 >> 1/13 Cefazolin 1/14 >>1/15 Vancomycin 1/15 >> Merrem 1/15 >>  Dose adjustments this admission:   Microbiology results: 1/4 covid / flu - negative 1/5 MRSA PCR - negative 1/11 TA - mssa (Re-incubated)  1/11 Bld - negative 1/15 Resp Cx >> rare GPC, few GVR (Re-incubated) 1/15 BCx >>   Thank you for allowing pharmacy to be a part of this patient's care.  Sloan Leiter, PharmD, BCPS, BCCCP Clinical Pharmacist Please refer to [facility] for [place]  numbers 07/06/2019 2:31 PM

## 2019-07-06 NOTE — Progress Notes (Signed)
Dr. Bobbye Morton at bedside during patient un-proning.  MD titrated FIO2 down from 65% to 60% on vent.  MD instructed RT to continue to titrate FIO2 down to 55%, while maintaining sats of 88% or greater, then can start to titrate PEEP down by 1 as long as patient is maintaining sat goal.  Patient was successfully un-proned without complications.  Replaced the tape holding the tube in place after un-proning, airway secure.

## 2019-07-06 NOTE — Progress Notes (Signed)
RT and RN turned pts head and repositioned pts arms. Pt was turned to right, now turned to left. No complications noted at this time. RT will continue to monitor.
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
## 2019-07-06 NOTE — Progress Notes (Signed)
RT obtained ABG on pt with the following results. No changes at this time. RT will continue to monitor.   Results for Jay Cole, Jay Cole (MRN [id number]) as of 07/06/2019 04:48  Ref. Range 07/06/2019 04:10  Sample type Unknown ARTERIAL  pH, Arterial Latest Ref Range: 7.350 - 7.450  7.552 (H)  pCO2 arterial Latest Ref Range: 32.0 - 48.0 mmHg 40.0  pO2, Arterial Latest Ref Range: 83.0 - 108.0 mmHg 64.0 (L)  TCO2 Latest Ref Range: 22 - 32 mmol/L 36 (H)  Acid-Base Excess Latest Ref Range: 0.0 - 2.0 mmol/L 12.0 (H)  Bicarbonate Latest Ref Range: 20.0 - 28.0 mmol/L 34.8 (H)  O2 Saturation Latest Units: % 93.0  Patient temperature Unknown 101.5 F  Collection site Unknown ARTERIAL LINE

## 2019-07-06 NOTE — Progress Notes (Signed)
Pt proned this AM per MD order.  Drive pressure changed to 12 per MD to achieve desired Vt.  RT will obtain ABG in 1 hour.  RT will continue to monitor.
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
## 2019-07-07 ENCOUNTER — Inpatient Hospital Stay (HOSPITAL_COMMUNITY): Payer: Medicaid - Out of State

## 2019-07-07 LAB — POCT I-STAT 7, (LYTES, BLD GAS, ICA,H+H)
Acid-Base Excess: 12 mmol/L — ABNORMAL HIGH (ref 0.0–2.0)
Acid-Base Excess: 12 mmol/L — ABNORMAL HIGH (ref 0.0–2.0)
Bicarbonate: 36.4 mmol/L — ABNORMAL HIGH (ref 20.0–28.0)
Bicarbonate: 39.3 mmol/L — ABNORMAL HIGH (ref 20.0–28.0)
Calcium, Ion: 1.13 mmol/L — ABNORMAL LOW (ref 1.15–1.40)
Calcium, Ion: 1.07 mmol/L — ABNORMAL LOW (ref 1.15–1.40)
HCT: 24 % — ABNORMAL LOW (ref 39.0–52.0)
HCT: 27 % — ABNORMAL LOW (ref 39.0–52.0)
Hemoglobin: 8.2 g/dL — ABNORMAL LOW (ref 13.0–17.0)
Hemoglobin: 9.2 g/dL — ABNORMAL LOW (ref 13.0–17.0)
O2 Saturation: 93 %
O2 Saturation: 89 %
Patient temperature: 101.2
Patient temperature: 100.7
Potassium: 2.8 mmol/L — ABNORMAL LOW (ref 3.5–5.1)
Potassium: 2.8 mmol/L — ABNORMAL LOW (ref 3.5–5.1)
Sodium: 142 mmol/L (ref 135–145)
Sodium: 144 mmol/L (ref 135–145)
TCO2: 38 mmol/L — ABNORMAL HIGH (ref 22–32)
TCO2: 41 mmol/L — ABNORMAL HIGH (ref 22–32)
pCO2 arterial: 50.4 mmHg — ABNORMAL HIGH (ref 32.0–48.0)
pCO2 arterial: 67.7 mmHg (ref 32.0–48.0)
pH, Arterial: 7.472 — ABNORMAL HIGH (ref 7.350–7.450)
pH, Arterial: 7.377 (ref 7.350–7.450)
pO2, Arterial: 71 mmHg — ABNORMAL LOW (ref 83.0–108.0)
pO2, Arterial: 64 mmHg — ABNORMAL LOW (ref 83.0–108.0)

## 2019-07-07 LAB — CBC
HCT: 26.7 % — ABNORMAL LOW (ref 39.0–52.0)
Hemoglobin: 8.2 g/dL — ABNORMAL LOW (ref 13.0–17.0)
MCH: 28.2 pg (ref 26.0–34.0)
MCHC: 30.7 g/dL (ref 30.0–36.0)
MCV: 91.8 fL (ref 80.0–100.0)
Platelets: 563 10*3/uL — ABNORMAL HIGH (ref 150–400)
RBC: 2.91 MIL/uL — ABNORMAL LOW (ref 4.22–5.81)
RDW: 13.3 % (ref 11.5–15.5)
WBC: 21.5 10*3/uL — ABNORMAL HIGH (ref 4.0–10.5)
nRBC: 0.4 % — ABNORMAL HIGH (ref 0.0–0.2)

## 2019-07-07 LAB — GLUCOSE, CAPILLARY
Glucose-Capillary: 119 mg/dL — ABNORMAL HIGH (ref 70–99)
Glucose-Capillary: 122 mg/dL — ABNORMAL HIGH (ref 70–99)
Glucose-Capillary: 122 mg/dL — ABNORMAL HIGH (ref 70–99)
Glucose-Capillary: 122 mg/dL — ABNORMAL HIGH (ref 70–99)
Glucose-Capillary: 123 mg/dL — ABNORMAL HIGH (ref 70–99)
Glucose-Capillary: 129 mg/dL — ABNORMAL HIGH (ref 70–99)
Glucose-Capillary: 130 mg/dL — ABNORMAL HIGH (ref 70–99)
Glucose-Capillary: 131 mg/dL — ABNORMAL HIGH (ref 70–99)
Glucose-Capillary: 142 mg/dL — ABNORMAL HIGH (ref 70–99)
Glucose-Capillary: 147 mg/dL — ABNORMAL HIGH (ref 70–99)

## 2019-07-07 LAB — BASIC METABOLIC PANEL
Anion gap: 13 (ref 5–15)
BUN: 19 mg/dL (ref 6–20)
CO2: 32 mmol/L (ref 22–32)
Calcium: 8.1 mg/dL — ABNORMAL LOW (ref 8.9–10.3)
Chloride: 99 mmol/L (ref 98–111)
Creatinine, Ser: 0.57 mg/dL — ABNORMAL LOW (ref 0.61–1.24)
GFR calc Af Amer: >60 mL/min (ref >60)
GFR calc non Af Amer: >60 mL/min (ref >60)
Glucose, Bld: 125 mg/dL — ABNORMAL HIGH (ref 70–99)
Potassium: 2.9 mmol/L — ABNORMAL LOW (ref 3.5–5.1)
Sodium: 144 mmol/L (ref 135–145)

## 2019-07-07 LAB — MAGNESIUM: Magnesium: 2.4 mg/dL (ref 1.7–2.4)

## 2019-07-07 LAB — PHOSPHORUS: Phosphorus: 3.3 mg/dL (ref 2.5–4.6)

## 2019-07-07 MED ORDER — ACETAMINOPHEN 325 MG PO TABS
650.0000 mg | ORAL_TABLET | ORAL | Status: DC | PRN
  Administered 2019-07-20: 650 mg
  Filled 2019-07-07 (×5): qty 2

## 2019-07-07 MED ORDER — POTASSIUM CHLORIDE 10 MEQ/50ML IV SOLN
10.0000 meq | INTRAVENOUS | Status: AC
  Administered 2019-07-07 (×4): 10 meq via INTRAVENOUS
  Filled 2019-07-07 (×4): qty 50

## 2019-07-07 MED ORDER — POTASSIUM CHLORIDE 20 MEQ PO PACK
40.0000 meq | PACK | Freq: Once | ORAL | Status: AC
  Administered 2019-07-07: 40 meq
  Filled 2019-07-07: qty 2

## 2019-07-07 MED ORDER — FUROSEMIDE 10 MG/ML IJ SOLN
60.0000 mg | Freq: Once | INTRAMUSCULAR | Status: AC
  Administered 2019-07-07: 60 mg via INTRAVENOUS
  Filled 2019-07-07 (×2): qty 6

## 2019-07-07 MED ORDER — POTASSIUM CHLORIDE 20 MEQ/15ML (10%) PO SOLN
40.0000 meq | Freq: Once | ORAL | Status: DC
  Filled 2019-07-07: qty 30

## 2019-07-07 NOTE — Progress Notes (Signed)
Subjective: The patient remains intubated and prone.  Objective: Vital signs in last 24 hours: Temp:  [98.1 F (36.7 C)-101.2 F (38.4 C)] 101.2 F (38.4 C) (01/17 0800) Pulse Rate:  [106-123] 112 (01/17 0900) Resp:  [22-30] 30 (01/17 0900) BP: (109-155)/(52-80) 155/69 (01/17 0900) SpO2:  [89 %-100 %] 89 % (01/17 0900) Arterial Line BP: (77-164)/(47-76) 137/51 (01/17 0600) FiO2 (%):  [55 %-75 %] 55 % (01/17 0831) Weight:  [122.6 kg] 122.6 kg (01/17 0500) Estimated body mass index is 42.33 kg/m as calculated from the following:   Height as of this encounter: 5\' 7"  (1.702 m).   Weight as of this encounter: 122.6 kg.   Intake/Output from previous day: 01/16 0701 - 01/17 0700 In: 3957.1 [I.V.:1941.9; NG/GT:580; IV Piggyback:1435.2] Out: 5925 [Urine:5925] Intake/Output this shift: Total I/O In: 1840.8 [I.V.:824.7; NG/GT:560; IV Piggyback:456.2] Out: -   Physical exam the patient is prone, intubated and chemically paralyzed.  Lab Results: Recent Labs    07/06/19 0537 07/06/19 0919 07/07/19 0630 07/07/19 0630 07/07/19 0800 07/07/19 0953  WBC 20.9*  --  21.5*  --   --   --   HGB 8.8*   < > 8.2*   < > 8.2* 9.2*  HCT 27.7*   < > 26.7*   < > 24.0* 27.0*  PLT 538*  --  563*  --   --   --    < > = values in this interval not displayed.   BMET Recent Labs    07/06/19 0537 07/06/19 0919 07/07/19 0630 07/07/19 0630 07/07/19 0800 07/07/19 0953  NA 139   < > 144   < > 142 144  K 3.8   < > 2.9*   < > 2.8* 2.8*  CL 97*  --  99  --   --   --   CO2 30  --  32  --   --   --   GLUCOSE 118*  --  125*  --   --   --   BUN 16  --  19  --   --   --   CREATININE 0.52*  --  0.57*  --   --   --   CALCIUM 8.2*  --  8.1*  --   --   --    < > = values in this interval not displayed.    Studies/Results: CXR  Result Date: 07/07/2019 CLINICAL DATA:  Respiratory failure. EXAM: PORTABLE CHEST 1 VIEW COMPARISON:  One-view chest x-ray 07/06/2019 FINDINGS: The heart size is normal. Size  is exaggerate by low lung volumes. Endotracheal tube, right IJ line, right PICC line, and NG tube course off the inferior border of the film. Aeration of both lung bases is improving. Overall lung volumes are improving. Persistent right middle lobe and lower lobe airspace disease is present. Mild pulmonary vascular congestion is present. IMPRESSION: 1. Improving aeration of both lung bases. 2. Persistent right middle lobe and lower lobe airspace disease. 3. Support apparatus is stable. Electronically Signed   By: San Morelle M.D.   On: 07/07/2019 09:29   DG Chest Port 1 View  Result Date: 07/06/2019 CLINICAL DATA:  Endotracheal tube placement. EXAM: PORTABLE CHEST 1 VIEW COMPARISON:  Same day. FINDINGS: Stable cardiomediastinal silhouette. Endotracheal and nasogastric tubes are unchanged in position. Stable position of right internal jugular catheter. Right-sided PICC line is unchanged. No pneumothorax is noted. Mild left basilar atelectasis is noted. Mild to moderate right basilar atelectasis or infiltrate is noted with  associated effusion. Bony thorax is unremarkable. IMPRESSION: Stable support apparatus. Stable right basilar atelectasis or infiltrate is noted with associated pleural effusion. Mild left basilar atelectasis is noted. Electronically Signed   By: Marijo Conception M.D.   On: 07/06/2019 10:16   CXR  Result Date: 07/06/2019 CLINICAL DATA:  Respiratory failure. EXAM: PORTABLE CHEST 1 VIEW COMPARISON:  One-view chest x-ray/15/21 FINDINGS: Heart size is normal. Endotracheal tube terminates 6 cm above the carina, in satisfactory position. A new right IJ line is in place. The tip is at the cavoatrial junction. Right-sided PICC line is stable. NG tube courses off the inferior border of the film. Lung volumes are improved bilaterally. Right lower lobe and inferior upper lobe airspace opacities remain. IMPRESSION: 1. Interval placement of right IJ line without radiographic evidence for  complication. 2. Improving lung volumes. 3. Persistent right lower and upper lobe airspace disease. Electronically Signed   By: San Morelle M.D.   On: 07/06/2019 09:12   DG CHEST PORT 1 VIEW  Result Date: 07/05/2019 CLINICAL DATA:  24 year old male with central line placement. EXAM: PORTABLE CHEST 1 VIEW COMPARISON:  Earlier radiograph dated 07/05/2019. FINDINGS: Right IJ central venous line with tip in the region of the cavoatrial junction. Right-sided PICC with tip over upper SVC. Endotracheal tube above the carina and enteric tube extending beyond the inferior margin of the image. Shallow inspiration with bibasilar atelectasis, right greater than left. Small pleural effusion may be present. No pneumothorax. Stable cardiac silhouette. No acute osseous pathology. IMPRESSION: Interval placement of a right IJ central venous line with tip over central SVC. No pneumothorax. Electronically Signed   By: Anner Crete M.D.   On: 07/05/2019 20:42   DG Chest Port 1 View  Result Date: 07/05/2019 CLINICAL DATA:  24 year old male with ARDS. EXAM: PORTABLE CHEST 1 VIEW COMPARISON:  Chest radiograph dated 07/05/2019. FINDINGS: Endotracheal tube approximately 5.5 cm above the carina and enteric tube extending below the diaphragm with tip beyond the inferior margin of the image. Right-sided PICC with tip close to the cavoatrial junction. Shallow inspiration with bibasilar densities which may represent atelectasis or infiltrate. More confluent density in the right middle lobe may represent atelectasis or infiltrate. There is diffuse hazy density throughout the left lung. Small bilateral pleural effusion may be present. No pneumothorax. Stable cardiomediastinal silhouette. No acute osseous pathology. IMPRESSION: 1. More confluent area of density in the right middle lobe may represent atelectasis or infiltrate. Otherwise, no significant interval change in the appearance of the lungs. No pneumothorax. 2. Support  apparatus in similar position. Electronically Signed   By: Anner Crete M.D.   On: 07/05/2019 18:22    Assessment/Plan: Status post left craniectomy: The patient is without change without an exam to follow.  The prognosis seems poor.  LOS: 13 days     Ophelia Charter 07/07/2019, 10:15 AM

## 2019-07-07 NOTE — Progress Notes (Signed)
MD has requested PEEP be weaned q2h (no sooner) by 1 overnight as long as pt maintains Sat goal of 88% or greater. RT decreased pt from 16 to 15 as requested. Pt tolerating well without issue, pt sats 100%, respiratory status is stable at this time on vent. RT will continue to monitor.

## 2019-07-07 NOTE — Progress Notes (Addendum)
RT called by MD Lovick regarding ventilator changes. MD requesting that PEEP be weaned q2h (no sooner) by 1 overnight as long as pt maintains Sat goal of 88% or greater. RT decreased pt from 17 to 16 as requested. Pt tolerating well without issue, pt sats 94%, respiratory status is stable at this time on vent. RT will continue to monitor.
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
## 2019-07-08 ENCOUNTER — Inpatient Hospital Stay (HOSPITAL_COMMUNITY): Payer: Medicaid - Out of State

## 2019-07-08 LAB — GLUCOSE, CAPILLARY
Glucose-Capillary: 107 mg/dL — ABNORMAL HIGH (ref 70–99)
Glucose-Capillary: 110 mg/dL — ABNORMAL HIGH (ref 70–99)
Glucose-Capillary: 122 mg/dL — ABNORMAL HIGH (ref 70–99)
Glucose-Capillary: 127 mg/dL — ABNORMAL HIGH (ref 70–99)
Glucose-Capillary: 131 mg/dL — ABNORMAL HIGH (ref 70–99)
Glucose-Capillary: 147 mg/dL — ABNORMAL HIGH (ref 70–99)

## 2019-07-08 LAB — POCT I-STAT 7, (LYTES, BLD GAS, ICA,H+H)
Acid-Base Excess: 8 mmol/L — ABNORMAL HIGH (ref 0.0–2.0)
Acid-Base Excess: 15 mmol/L — ABNORMAL HIGH (ref 0.0–2.0)
Bicarbonate: 34.3 mmol/L — ABNORMAL HIGH (ref 20.0–28.0)
Bicarbonate: 41.3 mmol/L — ABNORMAL HIGH (ref 20.0–28.0)
Calcium, Ion: 1.15 mmol/L (ref 1.15–1.40)
Calcium, Ion: 1.12 mmol/L — ABNORMAL LOW (ref 1.15–1.40)
HCT: 26 % — ABNORMAL LOW (ref 39.0–52.0)
HCT: 29 % — ABNORMAL LOW (ref 39.0–52.0)
Hemoglobin: 8.8 g/dL — ABNORMAL LOW (ref 13.0–17.0)
Hemoglobin: 9.9 g/dL — ABNORMAL LOW (ref 13.0–17.0)
O2 Saturation: 87 %
O2 Saturation: 96 %
Patient temperature: 100
Patient temperature: 98.8
Potassium: 4.9 mmol/L (ref 3.5–5.1)
Potassium: 3.1 mmol/L — ABNORMAL LOW (ref 3.5–5.1)
Sodium: 140 mmol/L (ref 135–145)
Sodium: 144 mmol/L (ref 135–145)
TCO2: 36 mmol/L — ABNORMAL HIGH (ref 22–32)
TCO2: 43 mmol/L — ABNORMAL HIGH (ref 22–32)
pCO2 arterial: 60.7 mmHg — ABNORMAL HIGH (ref 32.0–48.0)
pCO2 arterial: 60.3 mmHg — ABNORMAL HIGH (ref 32.0–48.0)
pH, Arterial: 7.364 (ref 7.350–7.450)
pH, Arterial: 7.445 (ref 7.350–7.450)
pO2, Arterial: 60 mmHg — ABNORMAL LOW (ref 83.0–108.0)
pO2, Arterial: 79 mmHg — ABNORMAL LOW (ref 83.0–108.0)

## 2019-07-08 LAB — BASIC METABOLIC PANEL
Anion gap: 12 (ref 5–15)
BUN: 20 mg/dL (ref 6–20)
CO2: 36 mmol/L — ABNORMAL HIGH (ref 22–32)
Calcium: 8.2 mg/dL — ABNORMAL LOW (ref 8.9–10.3)
Chloride: 99 mmol/L (ref 98–111)
Creatinine, Ser: 0.5 mg/dL — ABNORMAL LOW (ref 0.61–1.24)
GFR calc Af Amer: >60 mL/min (ref >60)
GFR calc non Af Amer: >60 mL/min (ref >60)
Glucose, Bld: 110 mg/dL — ABNORMAL HIGH (ref 70–99)
Potassium: 3.1 mmol/L — ABNORMAL LOW (ref 3.5–5.1)
Sodium: 147 mmol/L — ABNORMAL HIGH (ref 135–145)

## 2019-07-08 LAB — CBC
HCT: 27.1 % — ABNORMAL LOW (ref 39.0–52.0)
Hemoglobin: 8.3 g/dL — ABNORMAL LOW (ref 13.0–17.0)
MCH: 27.9 pg (ref 26.0–34.0)
MCHC: 30.6 g/dL (ref 30.0–36.0)
MCV: 90.9 fL (ref 80.0–100.0)
Platelets: 612 10*3/uL — ABNORMAL HIGH (ref 150–400)
RBC: 2.98 MIL/uL — ABNORMAL LOW (ref 4.22–5.81)
RDW: 13.5 % (ref 11.5–15.5)
WBC: 18.8 10*3/uL — ABNORMAL HIGH (ref 4.0–10.5)
nRBC: 0.3 % — ABNORMAL HIGH (ref 0.0–0.2)

## 2019-07-08 LAB — CULTURE, RESPIRATORY W GRAM STAIN

## 2019-07-08 LAB — PHOSPHORUS: Phosphorus: 4.7 mg/dL — ABNORMAL HIGH (ref 2.5–4.6)

## 2019-07-08 LAB — MAGNESIUM: Magnesium: 2.2 mg/dL (ref 1.7–2.4)

## 2019-07-08 MED ORDER — POTASSIUM CHLORIDE 20 MEQ/15ML (10%) PO SOLN
40.0000 meq | Freq: Two times a day (BID) | ORAL | Status: DC
  Administered 2019-07-08 – 2019-07-11 (×8): 40 meq
  Filled 2019-07-08 (×8): qty 30

## 2019-07-08 MED ORDER — FUROSEMIDE 10 MG/ML IJ SOLN
40.0000 mg | Freq: Once | INTRAMUSCULAR | Status: AC
  Administered 2019-07-08: 40 mg via INTRAVENOUS
  Filled 2019-07-08: qty 4

## 2019-07-08 NOTE — Progress Notes (Signed)
Patient sats since de-proning have been in upper 90s to 100%.  Was able to wean PEEP down to 14, patient is still maintaining good sats. Will continue to monitor.

## 2019-07-08 NOTE — Progress Notes (Signed)
Patient ID: Jay Cole, male   DOB: 1995/11/10, 24 y.o.   MRN: [id number] I met with his mother at the bedside and updated her. I also D/W Dr. Ellene Route.  Georganna Skeans, MD, MPH, FACS Please use [email or web address] to contact on call provider

## 2019-07-08 NOTE — Progress Notes (Signed)
MD Lovick called this RT for updates on pt since being unproned. RT communicated pts vent Peak Pressure, VT, and MVe, along with vent PEEP information. MD instructed RT to decrease pts PEEP down to 12 from 14 before obtaining morning ABG as long as pts SPO2 remains within parameters given by MD of 88% or greater.  This RT relayed information and instructions to pts RT.

## 2019-07-08 NOTE — Progress Notes (Signed)
Patient ID: Jay Cole, male   DOB: [birth year], 24 y.o.   MRN: [id number] Stop by to examine patient mother is in the room.  Today's the first day he is been supine and his gases are improving with an FiO2 of 60% he is maintaining oxygenation.  His PEEP is still 12 and he is fairly labile with turning and positioning.  Hopefully another day or 2 will show further improvement.  In that regard I would plan surgical revision of his cranial lesion on Thursday.

## 2019-07-08 NOTE — Progress Notes (Signed)
Assisted tele visit to patient with family member.  Delcie Ruppert P, RN  

## 2019-07-08 NOTE — Progress Notes (Signed)
This RT and Abigail Butts, RRT, as well as team of RNs and NT de-proned patient.  This RT put patient on 100% FIO2 before turning to give patient some support, then turned patient back down to 50% afterwards.  The procedure went well, no adverse events occurred, patient sats maintained throughout.  Patient's current O2 sat is 95%.   Will continue to monitor.

## 2019-07-08 NOTE — Plan of Care (Signed)
  Problem: Clinical Measurements: Goal: Will remain free from infection Outcome: Progressing Goal: Cardiovascular complication will be avoided Outcome: Progressing   Problem: Activity: Goal: Risk for activity intolerance will decrease Outcome: Progressing   Problem: Nutrition: Goal: Adequate nutrition will be maintained Outcome: Progressing   Problem: Pain Managment: Goal: General experience of comfort will improve Outcome: Progressing   Problem: Safety: Goal: Ability to remain free from injury will improve Outcome: Progressing   Problem: Skin Integrity: Goal: Risk for impaired skin integrity will decrease Outcome: Progressing   Problem: Clinical Measurements: Goal: Neurologic status will improve Outcome: Not Progressing   Problem: Respiratory: Goal: Will regain and/or maintain adequate ventilation Outcome: Not Progressing   Problem: Skin Integrity: Goal: Demonstration of wound healing without infection will improve Outcome: Progressing   Problem: Psychosocial: Goal: Ability to verbalize positive feelings about self will improve Outcome: Not Progressing Goal: Ability to participate in self-care as condition permits will improve Outcome: Not Progressing Goal: Ability to identify appropriate support needs will improve Outcome: Not Progressing   Problem: Nutritional: Goal: Risk of aspiration will decrease Outcome: Not Progressing   Problem: Communication: Goal: Ability to communicate needs accurately will improve Outcome: Not Progressing

## 2019-07-08 NOTE — Progress Notes (Signed)
Pt getting tachicardic (130s) and hypertensive (180s).  Metoprolol given.
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
## 2019-07-09 LAB — POCT I-STAT 7, (LYTES, BLD GAS, ICA,H+H)
Acid-Base Excess: 16 mmol/L — ABNORMAL HIGH (ref 0.0–2.0)
Bicarbonate: 40.8 mmol/L — ABNORMAL HIGH (ref 20.0–28.0)
Calcium, Ion: 1.14 mmol/L — ABNORMAL LOW (ref 1.15–1.40)
HCT: 25 % — ABNORMAL LOW (ref 39.0–52.0)
Hemoglobin: 8.5 g/dL — ABNORMAL LOW (ref 13.0–17.0)
O2 Saturation: 98 %
Patient temperature: 99
Potassium: 3.4 mmol/L — ABNORMAL LOW (ref 3.5–5.1)
Sodium: 145 mmol/L (ref 135–145)
TCO2: 42 mmol/L — ABNORMAL HIGH (ref 22–32)
pCO2 arterial: 49.7 mmHg — ABNORMAL HIGH (ref 32.0–48.0)
pH, Arterial: 7.523 — ABNORMAL HIGH (ref 7.350–7.450)
pO2, Arterial: 105 mmHg (ref 83.0–108.0)

## 2019-07-09 LAB — BASIC METABOLIC PANEL
Anion gap: 11 (ref 5–15)
BUN: 24 mg/dL — ABNORMAL HIGH (ref 6–20)
CO2: 34 mmol/L — ABNORMAL HIGH (ref 22–32)
Calcium: 8.2 mg/dL — ABNORMAL LOW (ref 8.9–10.3)
Chloride: 102 mmol/L (ref 98–111)
Creatinine, Ser: 0.54 mg/dL — ABNORMAL LOW (ref 0.61–1.24)
GFR calc Af Amer: >60 mL/min (ref >60)
GFR calc non Af Amer: >60 mL/min (ref >60)
Glucose, Bld: 180 mg/dL — ABNORMAL HIGH (ref 70–99)
Potassium: 3.5 mmol/L (ref 3.5–5.1)
Sodium: 147 mmol/L — ABNORMAL HIGH (ref 135–145)

## 2019-07-09 LAB — CBC
HCT: 28.7 % — ABNORMAL LOW (ref 39.0–52.0)
Hemoglobin: 8.7 g/dL — ABNORMAL LOW (ref 13.0–17.0)
MCH: 28.1 pg (ref 26.0–34.0)
MCHC: 30.3 g/dL (ref 30.0–36.0)
MCV: 92.6 fL (ref 80.0–100.0)
Platelets: 638 10*3/uL — ABNORMAL HIGH (ref 150–400)
RBC: 3.1 MIL/uL — ABNORMAL LOW (ref 4.22–5.81)
RDW: 13.9 % (ref 11.5–15.5)
WBC: 14.6 10*3/uL — ABNORMAL HIGH (ref 4.0–10.5)
nRBC: 0.5 % — ABNORMAL HIGH (ref 0.0–0.2)

## 2019-07-09 LAB — GLUCOSE, CAPILLARY
Glucose-Capillary: 102 mg/dL — ABNORMAL HIGH (ref 70–99)
Glucose-Capillary: 120 mg/dL — ABNORMAL HIGH (ref 70–99)
Glucose-Capillary: 105 mg/dL — ABNORMAL HIGH (ref 70–99)
Glucose-Capillary: 133 mg/dL — ABNORMAL HIGH (ref 70–99)
Glucose-Capillary: 121 mg/dL — ABNORMAL HIGH (ref 70–99)
Glucose-Capillary: 126 mg/dL — ABNORMAL HIGH (ref 70–99)

## 2019-07-09 LAB — CULTURE, RESPIRATORY W GRAM STAIN

## 2019-07-09 LAB — TRIGLYCERIDES: Triglycerides: 470 mg/dL — ABNORMAL HIGH (ref <150)

## 2019-07-09 MED ORDER — STERILE WATER FOR INJECTION IJ SOLN
INTRAMUSCULAR | Status: AC
  Administered 2019-07-09: 5 mL
  Filled 2019-07-09: qty 10

## 2019-07-09 MED ORDER — ACETAZOLAMIDE SODIUM 500 MG IJ SOLR
500.0000 mg | Freq: Two times a day (BID) | INTRAMUSCULAR | Status: DC
  Administered 2019-07-09 – 2019-07-10 (×4): 500 mg via INTRAVENOUS
  Filled 2019-07-09 (×5): qty 500

## 2019-07-09 MED ORDER — FUROSEMIDE 10 MG/ML IJ SOLN
40.0000 mg | Freq: Once | INTRAMUSCULAR | Status: AC
  Administered 2019-07-09: 40 mg via INTRAVENOUS
  Filled 2019-07-09: qty 4

## 2019-07-09 MED ORDER — FUROSEMIDE 10 MG/ML IJ SOLN
60.0000 mg | Freq: Two times a day (BID) | INTRAMUSCULAR | Status: DC
  Administered 2019-07-10 – 2019-07-13 (×8): 60 mg via INTRAVENOUS
  Filled 2019-07-09 (×9): qty 6

## 2019-07-09 NOTE — Progress Notes (Signed)
Patient ID: Jay Cole, male   DOB: [birth year], 24 y.o.   MRN: [id number] Patient seen with mother at bedside.  Examination I note is craniectomy site is ballotable and it is pulsatile today was it had not been previously.  His respiratory requirements have decreased though he is still on 8 of PEEP and 50% FiO2 on the ventilator.  Pupils are 3 mm and both reactive.  He has been on some fairly heavy sedation still.  In light of the fact that he is now showing signs of softening of the cranial site I am hard put to suggest that surgery for further debridement will be necessary.  It is probable that he is starting to resorb some of the contused and devitalized areas of brain.  And if the pressure in his head is decreased this would be the ultimate goal of any surgical procedure.  We will continue to observe him medically at the current time and hold off on any further consideration of cranial surgery.
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
## 2019-07-09 NOTE — Progress Notes (Signed)
40 mL versed and 100 mL vecuronium wasted with Budd Palmer RN

## 2019-07-10 LAB — GLUCOSE, CAPILLARY
Glucose-Capillary: 104 mg/dL — ABNORMAL HIGH (ref 70–99)
Glucose-Capillary: 135 mg/dL — ABNORMAL HIGH (ref 70–99)
Glucose-Capillary: 103 mg/dL — ABNORMAL HIGH (ref 70–99)
Glucose-Capillary: 121 mg/dL — ABNORMAL HIGH (ref 70–99)
Glucose-Capillary: 112 mg/dL — ABNORMAL HIGH (ref 70–99)
Glucose-Capillary: 109 mg/dL — ABNORMAL HIGH (ref 70–99)

## 2019-07-10 LAB — BASIC METABOLIC PANEL
Anion gap: 11 (ref 5–15)
BUN: 25 mg/dL — ABNORMAL HIGH (ref 6–20)
CO2: 27 mmol/L (ref 22–32)
Calcium: 8.6 mg/dL — ABNORMAL LOW (ref 8.9–10.3)
Chloride: 105 mmol/L (ref 98–111)
Creatinine, Ser: 0.49 mg/dL — ABNORMAL LOW (ref 0.61–1.24)
GFR calc Af Amer: >60 mL/min (ref >60)
GFR calc non Af Amer: >60 mL/min (ref >60)
Glucose, Bld: 219 mg/dL — ABNORMAL HIGH (ref 70–99)
Potassium: 3.9 mmol/L (ref 3.5–5.1)
Sodium: 143 mmol/L (ref 135–145)

## 2019-07-10 LAB — CULTURE, BLOOD (ROUTINE X 2)
Culture: NO GROWTH
Culture: NO GROWTH
Special Requests: ADEQUATE
Special Requests: ADEQUATE

## 2019-07-10 LAB — CBC
HCT: 31.3 % — ABNORMAL LOW (ref 39.0–52.0)
Hemoglobin: 9.1 g/dL — ABNORMAL LOW (ref 13.0–17.0)
MCH: 27.9 pg (ref 26.0–34.0)
MCHC: 29.1 g/dL — ABNORMAL LOW (ref 30.0–36.0)
MCV: 96 fL (ref 80.0–100.0)
Platelets: 659 10*3/uL — ABNORMAL HIGH (ref 150–400)
RBC: 3.26 MIL/uL — ABNORMAL LOW (ref 4.22–5.81)
RDW: 14 % (ref 11.5–15.5)
WBC: 15.9 10*3/uL — ABNORMAL HIGH (ref 4.0–10.5)
nRBC: 0.7 % — ABNORMAL HIGH (ref 0.0–0.2)

## 2019-07-10 MED ORDER — STERILE WATER FOR INJECTION IJ SOLN
INTRAMUSCULAR | Status: AC
  Administered 2019-07-10: 5 mL
  Filled 2019-07-10: qty 10
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
## 2019-07-11 ENCOUNTER — Inpatient Hospital Stay (HOSPITAL_COMMUNITY): Payer: Medicaid - Out of State

## 2019-07-11 LAB — BASIC METABOLIC PANEL
Anion gap: 9 (ref 5–15)
BUN: 27 mg/dL — ABNORMAL HIGH (ref 6–20)
CO2: 24 mmol/L (ref 22–32)
Calcium: 8.1 mg/dL — ABNORMAL LOW (ref 8.9–10.3)
Chloride: 109 mmol/L (ref 98–111)
Creatinine, Ser: 0.46 mg/dL — ABNORMAL LOW (ref 0.61–1.24)
GFR calc Af Amer: >60 mL/min (ref >60)
GFR calc non Af Amer: >60 mL/min (ref >60)
Glucose, Bld: 146 mg/dL — ABNORMAL HIGH (ref 70–99)
Potassium: 3.5 mmol/L (ref 3.5–5.1)
Sodium: 142 mmol/L (ref 135–145)

## 2019-07-11 LAB — GLUCOSE, CAPILLARY
Glucose-Capillary: 103 mg/dL — ABNORMAL HIGH (ref 70–99)
Glucose-Capillary: 131 mg/dL — ABNORMAL HIGH (ref 70–99)
Glucose-Capillary: 103 mg/dL — ABNORMAL HIGH (ref 70–99)
Glucose-Capillary: 137 mg/dL — ABNORMAL HIGH (ref 70–99)
Glucose-Capillary: 126 mg/dL — ABNORMAL HIGH (ref 70–99)
Glucose-Capillary: 140 mg/dL — ABNORMAL HIGH (ref 70–99)

## 2019-07-11 LAB — CBC
HCT: 30.9 % — ABNORMAL LOW (ref 39.0–52.0)
Hemoglobin: 9.5 g/dL — ABNORMAL LOW (ref 13.0–17.0)
MCH: 29 pg (ref 26.0–34.0)
MCHC: 30.7 g/dL (ref 30.0–36.0)
MCV: 94.2 fL (ref 80.0–100.0)
Platelets: 656 10*3/uL — ABNORMAL HIGH (ref 150–400)
RBC: 3.28 MIL/uL — ABNORMAL LOW (ref 4.22–5.81)
RDW: 14.2 % (ref 11.5–15.5)
WBC: 12.9 10*3/uL — ABNORMAL HIGH (ref 4.0–10.5)
nRBC: 0.9 % — ABNORMAL HIGH (ref 0.0–0.2)

## 2019-07-11 LAB — POCT I-STAT 7, (LYTES, BLD GAS, ICA,H+H)
Acid-Base Excess: 3 mmol/L — ABNORMAL HIGH (ref 0.0–2.0)
Bicarbonate: 27.5 mmol/L (ref 20.0–28.0)
Calcium, Ion: 1.27 mmol/L (ref 1.15–1.40)
HCT: 29 % — ABNORMAL LOW (ref 39.0–52.0)
Hemoglobin: 9.9 g/dL — ABNORMAL LOW (ref 13.0–17.0)
O2 Saturation: 99 %
Patient temperature: 98.8
Potassium: 4.3 mmol/L (ref 3.5–5.1)
Sodium: 146 mmol/L — ABNORMAL HIGH (ref 135–145)
TCO2: 29 mmol/L (ref 22–32)
pCO2 arterial: 43.3 mmHg (ref 32.0–48.0)
pH, Arterial: 7.412 (ref 7.350–7.450)
pO2, Arterial: 131 mmHg — ABNORMAL HIGH (ref 83.0–108.0)

## 2019-07-11 MED ORDER — PIVOT 1.5 CAL PO LIQD
1000.0000 mL | ORAL | Status: DC
  Administered 2019-07-12 – 2019-07-15 (×4): 1000 mL
  Filled 2019-07-11 (×4): qty 1000

## 2019-07-11 NOTE — Progress Notes (Signed)
Patient ID: Jay Cole, male   DOB: 1996-02-05, 24 y.o.   MRN: [id number] Vital signs are stable CT of the head shows that he is starting to resorb some of the blood clot and some of the devitalized brain causing an ex vacuo effect on the ventricle.  Continue to observe conservatively.  He does appear to be more interactive and responding with some purposeful movement on the left to pain

## 2019-07-11 NOTE — Progress Notes (Signed)
Orthopedic Tech Progress Note Patient Details:  Jay Cole 03/28/1996 [id number]  Ortho Devices Ortho Device/Splint Location: Bilateral Prafo boots Ortho Device/Splint Interventions: Application   Post Interventions Patient Tolerated: Well Instructions Provided: Care of device   [name] 07/11/2019, 1:35 PM
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
## 2019-07-11 NOTE — Progress Notes (Signed)
Patient ID: [name], male   DOB: 1995/10/02, 24 y.o.   MRN: [id number] I updated his mother at the bedside.  Georganna Skeans, MD, MPH, FACS Please use [email or web address] to contact on call provider

## 2019-07-11 NOTE — Progress Notes (Signed)
RT obtained ABG on pt with the following results. Pts PEEP titrated to 6 from 8 per ARDS protocol, pts PaO2 is 131. RT will continue to monitor.    Results for Jay Cole, Jay Cole (MRN [id number]) as of 07/11/2019 05:05  Ref. Range 07/11/2019 04:40  Sample type Unknown ARTERIAL  pH, Arterial Latest Ref Range: 7.350 - 7.450  7.412  pCO2 arterial Latest Ref Range: 32.0 - 48.0 mmHg 43.3  pO2, Arterial Latest Ref Range: 83.0 - 108.0 mmHg 131.0 (H)  TCO2 Latest Ref Range: 22 - 32 mmol/L 29  Acid-Base Excess Latest Ref Range: 0.0 - 2.0 mmol/L 3.0 (H)  Bicarbonate Latest Ref Range: 20.0 - 28.0 mmol/L 27.5  O2 Saturation Latest Units: % 99.0  Patient temperature Unknown 98.8 F  Collection site Unknown RADIAL, ALLEN'S TEST ACCEPTABLE
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
## 2019-07-11 NOTE — Progress Notes (Signed)
Pt was transported to and from CT via ventilator with no complications noted. Pt now back in room 4N24. RT will continue to monitor.

## 2019-07-12 ENCOUNTER — Encounter (HOSPITAL_COMMUNITY): Admission: EM | Disposition: E | Payer: Self-pay | Source: Home / Self Care

## 2019-07-12 ENCOUNTER — Inpatient Hospital Stay (HOSPITAL_COMMUNITY): Payer: Medicaid - Out of State | Admitting: Certified Registered"

## 2019-07-12 ENCOUNTER — Inpatient Hospital Stay (HOSPITAL_COMMUNITY): Payer: Medicaid - Out of State

## 2019-07-12 HISTORY — PX: TRACHEOSTOMY TUBE PLACEMENT: SHX814

## 2019-07-12 HISTORY — PX: ESOPHAGOGASTRODUODENOSCOPY: SHX5428

## 2019-07-12 HISTORY — PX: PEG PLACEMENT: SHX5437

## 2019-07-12 LAB — CBC
HCT: 32.5 % — ABNORMAL LOW (ref 39.0–52.0)
Hemoglobin: 9.7 g/dL — ABNORMAL LOW (ref 13.0–17.0)
MCH: 28.1 pg (ref 26.0–34.0)
MCHC: 29.8 g/dL — ABNORMAL LOW (ref 30.0–36.0)
MCV: 94.2 fL (ref 80.0–100.0)
Platelets: 662 10*3/uL — ABNORMAL HIGH (ref 150–400)
RBC: 3.45 MIL/uL — ABNORMAL LOW (ref 4.22–5.81)
RDW: 14.6 % (ref 11.5–15.5)
WBC: 12.5 10*3/uL — ABNORMAL HIGH (ref 4.0–10.5)
nRBC: 0.6 % — ABNORMAL HIGH (ref 0.0–0.2)

## 2019-07-12 LAB — BASIC METABOLIC PANEL
Anion gap: 12 (ref 5–15)
BUN: 31 mg/dL — ABNORMAL HIGH (ref 6–20)
CO2: 27 mmol/L (ref 22–32)
Calcium: 8.6 mg/dL — ABNORMAL LOW (ref 8.9–10.3)
Chloride: 108 mmol/L (ref 98–111)
Creatinine, Ser: 0.66 mg/dL (ref 0.61–1.24)
GFR calc Af Amer: >60 mL/min (ref >60)
GFR calc non Af Amer: >60 mL/min (ref >60)
Glucose, Bld: 114 mg/dL — ABNORMAL HIGH (ref 70–99)
Potassium: 4.1 mmol/L (ref 3.5–5.1)
Sodium: 147 mmol/L — ABNORMAL HIGH (ref 135–145)

## 2019-07-12 LAB — GLUCOSE, CAPILLARY
Glucose-Capillary: 128 mg/dL — ABNORMAL HIGH (ref 70–99)
Glucose-Capillary: 104 mg/dL — ABNORMAL HIGH (ref 70–99)
Glucose-Capillary: 157 mg/dL — ABNORMAL HIGH (ref 70–99)
Glucose-Capillary: 156 mg/dL — ABNORMAL HIGH (ref 70–99)
Glucose-Capillary: 148 mg/dL — ABNORMAL HIGH (ref 70–99)

## 2019-07-12 LAB — TRIGLYCERIDES
Triglycerides: 502 mg/dL — ABNORMAL HIGH (ref <150)
Triglycerides: 479 mg/dL — ABNORMAL HIGH (ref <150)

## 2019-07-12 SURGERY — CREATION, TRACHEOSTOMY
Anesthesia: General | Site: Throat

## 2019-07-12 SURGERY — EGD (ESOPHAGOGASTRODUODENOSCOPY)
Anesthesia: Moderate Sedation

## 2019-07-12 MED ORDER — FENTANYL CITRATE (PF) 100 MCG/2ML IJ SOLN
INTRAMUSCULAR | Status: DC | PRN
  Administered 2019-07-12: 50 ug via INTRAVENOUS
  Administered 2019-07-12 (×2): 100 ug via INTRAVENOUS

## 2019-07-12 MED ORDER — PROPOFOL 10 MG/ML IV BOLUS
INTRAVENOUS | Status: AC
  Filled 2019-07-12: qty 20

## 2019-07-12 MED ORDER — MIDAZOLAM HCL 2 MG/2ML IJ SOLN
INTRAMUSCULAR | Status: AC
  Filled 2019-07-12: qty 2

## 2019-07-12 MED ORDER — LIDOCAINE-EPINEPHRINE 1.5 %-1:200,000 OPTIME - NO CHARGE
INTRAMUSCULAR | Status: DC | PRN
  Administered 2019-07-12: 10 mL via SUBCUTANEOUS

## 2019-07-12 MED ORDER — STERILE WATER FOR IRRIGATION IR SOLN
Status: DC | PRN
  Administered 2019-07-12: 1000 mL

## 2019-07-12 MED ORDER — 0.9 % SODIUM CHLORIDE (POUR BTL) OPTIME
TOPICAL | Status: DC | PRN
  Administered 2019-07-12: 11:00:00 1000 mL

## 2019-07-12 MED ORDER — FENTANYL CITRATE (PF) 100 MCG/2ML IJ SOLN: INTRAMUSCULAR | Status: DC | PRN

## 2019-07-12 MED ORDER — ROCURONIUM BROMIDE 10 MG/ML (PF) SYRINGE
PREFILLED_SYRINGE | INTRAVENOUS | Status: DC | PRN
  Administered 2019-07-12: 100 mg via INTRAVENOUS
  Administered 2019-07-12: 30 mg via INTRAVENOUS
  Administered 2019-07-12 (×2): 50 mg via INTRAVENOUS

## 2019-07-12 MED ORDER — LACTATED RINGERS IV SOLN: INTRAVENOUS | Status: DC | PRN

## 2019-07-12 MED ORDER — MIDAZOLAM HCL 5 MG/5ML IJ SOLN
INTRAMUSCULAR | Status: DC | PRN
  Administered 2019-07-12: 2 mg via INTRAVENOUS

## 2019-07-12 MED ORDER — ONDANSETRON HCL 4 MG/2ML IJ SOLN
INTRAMUSCULAR | Status: DC | PRN
  Administered 2019-07-12: 4 mg via INTRAVENOUS

## 2019-07-12 MED ORDER — POTASSIUM CHLORIDE 20 MEQ/15ML (10%) PO SOLN
40.0000 meq | Freq: Two times a day (BID) | ORAL | Status: DC
  Administered 2019-07-12 – 2019-07-13 (×3): 40 meq
  Filled 2019-07-12 (×3): qty 30

## 2019-07-12 MED ORDER — FENTANYL CITRATE (PF) 250 MCG/5ML IJ SOLN
INTRAMUSCULAR | Status: AC
  Filled 2019-07-12: qty 5

## 2019-07-12 MED ORDER — DEXAMETHASONE SODIUM PHOSPHATE 10 MG/ML IJ SOLN
INTRAMUSCULAR | Status: DC | PRN
  Administered 2019-07-12: 10 mg via INTRAVENOUS

## 2019-07-12 SURGICAL SUPPLY — 45 items
ADH SKN CLS APL DERMABOND .7 (GAUZE/BANDAGES/DRESSINGS) ×3
BLADE 10 SAFETY STRL DISP (BLADE) ×2 IMPLANT
BLADE CLIPPER SURG (BLADE) IMPLANT
CANISTER SUCT 3000ML PPV (MISCELLANEOUS) ×5 IMPLANT
COVER SURGICAL LIGHT HANDLE (MISCELLANEOUS) ×7 IMPLANT
COVER WAND RF STERILE (DRAPES) ×5 IMPLANT
DERMABOND ADVANCED (GAUZE/BANDAGES/DRESSINGS) ×2
DERMABOND ADVANCED .7 DNX12 (GAUZE/BANDAGES/DRESSINGS) IMPLANT
DRAPE UTILITY XL STRL (DRAPES) ×5 IMPLANT
ELECT CAUTERY BLADE 6.4 (BLADE) ×5 IMPLANT
ELECT REM PT RETURN 9FT ADLT (ELECTROSURGICAL) ×5
ELECTRODE REM PT RTRN 9FT ADLT (ELECTROSURGICAL) ×3 IMPLANT
GAUZE 4X4 16PLY RFD (DISPOSABLE) ×5 IMPLANT
GLOVE BIO SURGEON STRL SZ8 (GLOVE) ×3 IMPLANT
GLOVE BIOGEL PI IND STRL 8 (GLOVE) ×3 IMPLANT
GLOVE BIOGEL PI INDICATOR 8 (GLOVE) ×2
GOWN STRL REUS W/ TWL LRG LVL3 (GOWN DISPOSABLE) ×3 IMPLANT
GOWN STRL REUS W/ TWL XL LVL3 (GOWN DISPOSABLE) ×3 IMPLANT
GOWN STRL REUS W/TWL LRG LVL3 (GOWN DISPOSABLE) ×30
GOWN STRL REUS W/TWL XL LVL3 (GOWN DISPOSABLE) ×5
INTRODUCER TRACH BLUE RHINO 6F (TUBING) ×2 IMPLANT
INTRODUCER TRACH BLUE RHINO 8F (TUBING) IMPLANT
KIT BASIN OR (CUSTOM PROCEDURE TRAY) ×5 IMPLANT
KIT TURNOVER KIT B (KITS) ×5 IMPLANT
NDL SPNL 22GX3.5 QUINCKE BK (NEEDLE) IMPLANT
NEEDLE SPNL 22GX3.5 QUINCKE BK (NEEDLE) ×5 IMPLANT
NS IRRIG 1000ML POUR BTL (IV SOLUTION) ×5 IMPLANT
PACK EENT II TURBAN DRAPE (CUSTOM PROCEDURE TRAY) ×5 IMPLANT
PAD ARMBOARD 7.5X6 YLW CONV (MISCELLANEOUS) ×10 IMPLANT
PENCIL BUTTON HOLSTER BLD 10FT (ELECTRODE) ×5 IMPLANT
PENCIL SMOKE EVAC W/HOLSTER (ELECTROSURGICAL) ×2 IMPLANT
SET TUBE SMOKE EVAC HIGH FLOW (TUBING) ×2 IMPLANT
SPONGE INTESTINAL PEANUT (DISPOSABLE) ×5 IMPLANT
SUT MNCRL AB 4-0 PS2 18 (SUTURE) ×2 IMPLANT
SUT SILK 2 0 SH CR/8 (SUTURE) ×2 IMPLANT
SUT VICRYL 0 UR6 27IN ABS (SUTURE) ×4 IMPLANT
SUT VICRYL AB 3 0 TIES (SUTURE) ×5 IMPLANT
SYR 20ML LL LF (SYRINGE) ×5 IMPLANT
TOWEL GREEN STERILE (TOWEL DISPOSABLE) ×5 IMPLANT
TOWEL GREEN STERILE FF (TOWEL DISPOSABLE) ×5 IMPLANT
TROCAR ADV FIXATION 5X100MM (TROCAR) ×2 IMPLANT
TROCAR XCEL BLUNT TIP 100MML (ENDOMECHANICALS) ×2 IMPLANT
TUBE CONNECTING 12'X1/4 (SUCTIONS) ×1
TUBE CONNECTING 12X1/4 (SUCTIONS) ×4 IMPLANT
TUBE ENDOVIVE SAFETY PEG KIT (TUBING) ×2 IMPLANT

## 2019-07-12 NOTE — Anesthesia Preprocedure Evaluation (Addendum)
Anesthesia Evaluation  Patient identified by MRN, date of birth, ID band Patient unresponsive    Reviewed: Unable to perform ROS - Chart review only  Airway Mallampati: Intubated       Dental   Pulmonary    breath sounds clear to auscultation       Cardiovascular  Rhythm:Regular Rate:Normal     Neuro/Psych    GI/Hepatic   Endo/Other    Renal/GU      Musculoskeletal   Abdominal   Peds  Hematology   Anesthesia Other Findings   Reproductive/Obstetrics                            Anesthesia Physical Anesthesia Plan  ASA: IV  Anesthesia Plan: General   Post-op Pain Management:    Induction: Intravenous  PONV Risk Score and Plan: 2 and Ondansetron, Dexamethasone and Midazolam  Airway Management Planned: Oral ETT  Additional Equipment:   Intra-op Plan:   Post-operative Plan: Post-operative intubation/ventilation  Informed Consent: I have reviewed the patients History and Physical, chart, labs and discussed the procedure including the risks, benefits and alternatives for the proposed anesthesia with the patient or authorized representative who has indicated his/her understanding and acceptance.       Plan Discussed with: CRNA and Anesthesiologist  Anesthesia Plan Comments:         Anesthesia Quick Evaluation

## 2019-07-12 NOTE — Transfer of Care (Signed)
Immediate Anesthesia Transfer of Care Note  Patient: Jay Cole  Procedure(s) Performed: TRACHEOSTOMY (N/A Throat) Laparascopic Assisted PEG tube placement (N/A ) Esophagogastroduodenoscopy (Egd)  Patient Location: ICU  Anesthesia Type:General  Level of Consciousness: sedated  Airway & Oxygen Therapy: Patient remains intubated per anesthesia plan and Patient placed on Ventilator (see vital sign flow sheet for setting)  Post-op Assessment: Report given to RN and Post -op Vital signs reviewed and stable  Post vital signs: Reviewed and stable  Last Vitals:  Vitals Value Taken Time  BP    Temp    Pulse    Resp    SpO2      Last Pain:  Vitals:   07/02/2019 0900  TempSrc: Axillary         Complications: No apparent anesthesia complications

## 2019-07-12 NOTE — Care Management (Signed)
Pt remains inappropriate for SBIRT assessment at this time as he is currently sedated and on ventilator.    Reinaldo Raddle, RN, BSN  Trauma/Neuro ICU Case Manager [phone]
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
## 2019-07-13 LAB — GLUCOSE, CAPILLARY
Glucose-Capillary: 108 mg/dL — ABNORMAL HIGH (ref 70–99)
Glucose-Capillary: 139 mg/dL — ABNORMAL HIGH (ref 70–99)
Glucose-Capillary: 127 mg/dL — ABNORMAL HIGH (ref 70–99)
Glucose-Capillary: 133 mg/dL — ABNORMAL HIGH (ref 70–99)
Glucose-Capillary: 126 mg/dL — ABNORMAL HIGH (ref 70–99)

## 2019-07-13 LAB — CBC
HCT: 30.8 % — ABNORMAL LOW (ref 39.0–52.0)
Hemoglobin: 9.8 g/dL — ABNORMAL LOW (ref 13.0–17.0)
MCH: 29.1 pg (ref 26.0–34.0)
MCHC: 31.8 g/dL (ref 30.0–36.0)
MCV: 91.4 fL (ref 80.0–100.0)
Platelets: 527 10*3/uL — ABNORMAL HIGH (ref 150–400)
RBC: 3.37 MIL/uL — ABNORMAL LOW (ref 4.22–5.81)
RDW: 14.3 % (ref 11.5–15.5)
WBC: 11.7 10*3/uL — ABNORMAL HIGH (ref 4.0–10.5)
nRBC: 0.3 % — ABNORMAL HIGH (ref 0.0–0.2)

## 2019-07-13 LAB — BASIC METABOLIC PANEL
Anion gap: 10 (ref 5–15)
BUN: 32 mg/dL — ABNORMAL HIGH (ref 6–20)
CO2: 27 mmol/L (ref 22–32)
Calcium: 8.2 mg/dL — ABNORMAL LOW (ref 8.9–10.3)
Chloride: 108 mmol/L (ref 98–111)
Creatinine, Ser: 0.5 mg/dL — ABNORMAL LOW (ref 0.61–1.24)
GFR calc Af Amer: >60 mL/min (ref >60)
GFR calc non Af Amer: >60 mL/min (ref >60)
Glucose, Bld: 138 mg/dL — ABNORMAL HIGH (ref 70–99)
Potassium: 3.4 mmol/L — ABNORMAL LOW (ref 3.5–5.1)
Sodium: 145 mmol/L (ref 135–145)

## 2019-07-13 MED ORDER — MIDAZOLAM HCL 2 MG/2ML IJ SOLN
INTRAMUSCULAR | Status: AC
  Filled 2019-07-13: qty 2

## 2019-07-13 MED ORDER — MIDAZOLAM HCL 2 MG/2ML IJ SOLN
2.0000 mg | Freq: Once | INTRAMUSCULAR | Status: AC
  Administered 2019-07-13: 2 mg via INTRAVENOUS

## 2019-07-13 MED ORDER — PROPRANOLOL HCL 10 MG PO TABS
10.0000 mg | ORAL_TABLET | Freq: Three times a day (TID) | ORAL | Status: DC
  Administered 2019-07-13 – 2019-07-15 (×7): 10 mg
  Filled 2019-07-13 (×7): qty 1

## 2019-07-13 MED ORDER — MIDAZOLAM HCL 2 MG/2ML IJ SOLN
2.0000 mg | INTRAMUSCULAR | Status: DC | PRN
  Administered 2019-07-14 – 2019-07-17 (×16): 2 mg via INTRAVENOUS
  Filled 2019-07-13 (×18): qty 2

## 2019-07-13 MED ORDER — PROPOFOL 1000 MG/100ML IV EMUL
5.0000 ug/kg/min | INTRAVENOUS | Status: DC
  Administered 2019-07-13: 30 ug/kg/min via INTRAVENOUS
  Administered 2019-07-13: 15 ug/kg/min via INTRAVENOUS
  Administered 2019-07-13: 30 ug/kg/min via INTRAVENOUS
  Filled 2019-07-13 (×2): qty 100

## 2019-07-13 MED ORDER — PROPRANOLOL HCL 10 MG PO TABS
10.0000 mg | ORAL_TABLET | Freq: Three times a day (TID) | ORAL | Status: DC
  Administered 2019-07-13: 10 mg via ORAL
  Filled 2019-07-13 (×2): qty 1

## 2019-07-13 NOTE — Progress Notes (Signed)
Pt had 2 episodes of HR going into the 160's-170's with sweating and extremities extending. MD paged due to concern for neuro storming. Orders for propanolol entered.

## 2019-07-13 NOTE — Progress Notes (Signed)
Patient ID: Jay Cole, male   DOB: [birth year], 24 y.o.   MRN: [id number] No change neurologically traumatic brain injury minimal withdrawal
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
## 2019-07-14 LAB — GLUCOSE, CAPILLARY
Glucose-Capillary: 149 mg/dL — ABNORMAL HIGH (ref 70–99)
Glucose-Capillary: 94 mg/dL (ref 70–99)
Glucose-Capillary: 150 mg/dL — ABNORMAL HIGH (ref 70–99)
Glucose-Capillary: 103 mg/dL — ABNORMAL HIGH (ref 70–99)
Glucose-Capillary: 117 mg/dL — ABNORMAL HIGH (ref 70–99)
Glucose-Capillary: 128 mg/dL — ABNORMAL HIGH (ref 70–99)
Glucose-Capillary: 100 mg/dL — ABNORMAL HIGH (ref 70–99)

## 2019-07-14 LAB — MAGNESIUM: Magnesium: 2.5 mg/dL — ABNORMAL HIGH (ref 1.7–2.4)

## 2019-07-14 LAB — BASIC METABOLIC PANEL
Anion gap: 11 (ref 5–15)
BUN: 32 mg/dL — ABNORMAL HIGH (ref 6–20)
CO2: 28 mmol/L (ref 22–32)
Calcium: 8.9 mg/dL (ref 8.9–10.3)
Chloride: 107 mmol/L (ref 98–111)
Creatinine, Ser: 0.45 mg/dL — ABNORMAL LOW (ref 0.61–1.24)
GFR calc Af Amer: >60 mL/min (ref >60)
GFR calc non Af Amer: >60 mL/min (ref >60)
Glucose, Bld: 149 mg/dL — ABNORMAL HIGH (ref 70–99)
Potassium: 3.8 mmol/L (ref 3.5–5.1)
Sodium: 146 mmol/L — ABNORMAL HIGH (ref 135–145)

## 2019-07-14 MED ORDER — DEXMEDETOMIDINE HCL IN NACL 400 MCG/100ML IV SOLN
0.4000 ug/kg/h | INTRAVENOUS | Status: DC
  Administered 2019-07-14: 0.6 ug/kg/h via INTRAVENOUS
  Administered 2019-07-14 (×2): 0.4 ug/kg/h via INTRAVENOUS
  Administered 2019-07-15 (×3): 0.6 ug/kg/h via INTRAVENOUS
  Administered 2019-07-15: 0.9 ug/kg/h via INTRAVENOUS
  Administered 2019-07-16: 0.5 ug/kg/h via INTRAVENOUS
  Administered 2019-07-16: 21:00:00 0.4 ug/kg/h via INTRAVENOUS
  Administered 2019-07-16: 0.9 ug/kg/h via INTRAVENOUS
  Administered 2019-07-16: 0.5 ug/kg/h via INTRAVENOUS
  Administered 2019-07-17: 0.7 ug/kg/h via INTRAVENOUS
  Administered 2019-07-17: 0.6 ug/kg/h via INTRAVENOUS
  Administered 2019-07-17: 0.7 ug/kg/h via INTRAVENOUS
  Administered 2019-07-17: 10:00:00 0.6 ug/kg/h via INTRAVENOUS
  Administered 2019-07-18: 0.7 ug/kg/h via INTRAVENOUS
  Administered 2019-07-18: 0.5 ug/kg/h via INTRAVENOUS
  Administered 2019-07-18 (×2): 0.7 ug/kg/h via INTRAVENOUS
  Administered 2019-07-19: 12:00:00 0.8 ug/kg/h via INTRAVENOUS
  Administered 2019-07-19: 07:00:00 0.5 ug/kg/h via INTRAVENOUS
  Administered 2019-07-19: 0.7 ug/kg/h via INTRAVENOUS
  Administered 2019-07-19: 0.8 ug/kg/h via INTRAVENOUS
  Administered 2019-07-20: 1.2 ug/kg/h via INTRAVENOUS
  Administered 2019-07-20: 04:00:00 0.8 ug/kg/h via INTRAVENOUS
  Administered 2019-07-20: 21:00:00 1.2 ug/kg/h via INTRAVENOUS
  Administered 2019-07-20: 0.8 ug/kg/h via INTRAVENOUS
  Administered 2019-07-21 (×3): 1.2 ug/kg/h via INTRAVENOUS
  Administered 2019-07-22 – 2019-07-23 (×9): 0.8 ug/kg/h via INTRAVENOUS
  Administered 2019-07-23 – 2019-07-24 (×2): 0.9 ug/kg/h via INTRAVENOUS
  Administered 2019-07-24 (×2): 0.8 ug/kg/h via INTRAVENOUS
  Administered 2019-07-24 (×2): 0.9 ug/kg/h via INTRAVENOUS
  Administered 2019-07-25: 0.5 ug/kg/h via INTRAVENOUS
  Administered 2019-07-25: 1 ug/kg/h via INTRAVENOUS
  Administered 2019-07-25: 18:00:00 0.9 ug/kg/h via INTRAVENOUS
  Administered 2019-07-25: 1 ug/kg/h via INTRAVENOUS
  Administered 2019-07-25 (×2): 0.9 ug/kg/h via INTRAVENOUS
  Administered 2019-07-26: 07:00:00 1.1 ug/kg/h via INTRAVENOUS
  Administered 2019-07-26 (×3): 1 ug/kg/h via INTRAVENOUS
  Administered 2019-07-27: 1.2 ug/kg/h via INTRAVENOUS
  Administered 2019-07-27: 1 ug/kg/h via INTRAVENOUS
  Administered 2019-07-27: 0.6 ug/kg/h via INTRAVENOUS
  Administered 2019-07-27: 0.8 ug/kg/h via INTRAVENOUS
  Administered 2019-07-27: 1 ug/kg/h via INTRAVENOUS
  Administered 2019-07-27: 0.8 ug/kg/h via INTRAVENOUS
  Administered 2019-07-28: 0.5 ug/kg/h via INTRAVENOUS
  Administered 2019-07-31: 0.7 ug/kg/h via INTRAVENOUS
  Administered 2019-07-31 (×2): 0.6 ug/kg/h via INTRAVENOUS
  Administered 2019-07-31 – 2019-08-01 (×3): 0.7 ug/kg/h via INTRAVENOUS
  Administered 2019-08-01: 17:00:00 0.4 ug/kg/h via INTRAVENOUS
  Administered 2019-08-02: 0.7 ug/kg/h via INTRAVENOUS
  Administered 2019-08-02: 0.3 ug/kg/h via INTRAVENOUS
  Administered 2019-08-02: 0.6 ug/kg/h via INTRAVENOUS
  Administered 2019-08-03: 1.2 ug/kg/h via INTRAVENOUS
  Administered 2019-08-03 (×2): 1 ug/kg/h via INTRAVENOUS
  Administered 2019-08-03: 0.5 ug/kg/h via INTRAVENOUS
  Administered 2019-08-03 – 2019-08-04 (×2): 1.2 ug/kg/h via INTRAVENOUS
  Administered 2019-08-04: 0.6 ug/kg/h via INTRAVENOUS
  Administered 2019-08-04: 18:00:00 1.2 ug/kg/h via INTRAVENOUS
  Administered 2019-08-04: 09:00:00 0.8 ug/kg/h via INTRAVENOUS
  Administered 2019-08-04 – 2019-08-05 (×5): 1.2 ug/kg/h via INTRAVENOUS
  Administered 2019-08-06: 1 ug/kg/h via INTRAVENOUS
  Administered 2019-08-06 (×2): 0.8 ug/kg/h via INTRAVENOUS
  Administered 2019-08-06: 03:00:00 1.2 ug/kg/h via INTRAVENOUS
  Administered 2019-08-07: 0.602 ug/kg/h via INTRAVENOUS
  Administered 2019-08-07: 1.2 ug/kg/h via INTRAVENOUS
  Administered 2019-08-07 – 2019-08-08 (×3): 0.6 ug/kg/h via INTRAVENOUS
  Administered 2019-08-08 – 2019-08-09 (×3): 1.2 ug/kg/h via INTRAVENOUS
  Administered 2019-08-09 (×2): 1.1 ug/kg/h via INTRAVENOUS
  Administered 2019-08-09: 0.5 ug/kg/h via INTRAVENOUS
  Administered 2019-08-09: 1.2 ug/kg/h via INTRAVENOUS
  Administered 2019-08-09 – 2019-08-10 (×3): 1.1 ug/kg/h via INTRAVENOUS
  Administered 2019-08-10: 0.7 ug/kg/h via INTRAVENOUS
  Administered 2019-08-10: 1 ug/kg/h via INTRAVENOUS
  Administered 2019-08-10: 1.2 ug/kg/h via INTRAVENOUS
  Administered 2019-08-10: 0.8 ug/kg/h via INTRAVENOUS
  Administered 2019-08-10: 1.1 ug/kg/h via INTRAVENOUS
  Administered 2019-08-11 (×4): 0.7 ug/kg/h via INTRAVENOUS
  Administered 2019-08-12 (×2): 0.4 ug/kg/h via INTRAVENOUS
  Administered 2019-08-12: 0.6 ug/kg/h via INTRAVENOUS
  Administered 2019-08-13 (×2): 0.9 ug/kg/h via INTRAVENOUS
  Administered 2019-08-14 (×2): 0.4 ug/kg/h via INTRAVENOUS
  Administered 2019-08-15: 0.5 ug/kg/h via INTRAVENOUS
  Administered 2019-08-15 – 2019-08-19 (×9): 0.4 ug/kg/h via INTRAVENOUS
  Administered 2019-08-19 (×4): 1.2 ug/kg/h via INTRAVENOUS
  Administered 2019-08-20 (×4): 1 ug/kg/h via INTRAVENOUS
  Administered 2019-08-20: 1.002 ug/kg/h via INTRAVENOUS
  Administered 2019-08-20: 1.2 ug/kg/h via INTRAVENOUS
  Administered 2019-08-20: 1 ug/kg/h via INTRAVENOUS
  Administered 2019-08-21: 0.9 ug/kg/h via INTRAVENOUS
  Administered 2019-08-21: 08:00:00 1 ug/kg/h via INTRAVENOUS
  Administered 2019-08-21: 1.1 ug/kg/h via INTRAVENOUS
  Administered 2019-08-21 (×2): 1 ug/kg/h via INTRAVENOUS
  Administered 2019-08-21 (×2): 1.1 ug/kg/h via INTRAVENOUS
  Administered 2019-08-22: 14:00:00 0.5 ug/kg/h via INTRAVENOUS
  Administered 2019-08-22 (×4): 1 ug/kg/h via INTRAVENOUS
  Administered 2019-08-23: 0.4 ug/kg/h via INTRAVENOUS
  Administered 2019-08-24 (×5): 0.6 ug/kg/h via INTRAVENOUS
  Administered 2019-08-25 (×2): 0.4 ug/kg/h via INTRAVENOUS
  Administered 2019-08-25: 0.6 ug/kg/h via INTRAVENOUS
  Administered 2019-08-26 – 2019-08-27 (×3): 0.4 ug/kg/h via INTRAVENOUS
  Filled 2019-07-14 (×8): qty 100
  Filled 2019-07-14: qty 200
  Filled 2019-07-14 (×11): qty 100
  Filled 2019-07-14 (×2): qty 200
  Filled 2019-07-14 (×5): qty 100
  Filled 2019-07-14: qty 200
  Filled 2019-07-14 (×21): qty 100
  Filled 2019-07-14: qty 200
  Filled 2019-07-14 (×8): qty 100
  Filled 2019-07-14: qty 200
  Filled 2019-07-14 (×36): qty 100
  Filled 2019-07-14: qty 300
  Filled 2019-07-14 (×25): qty 100
  Filled 2019-07-14: qty 200
  Filled 2019-07-14 (×15): qty 100
  Filled 2019-07-14: qty 200
  Filled 2019-07-14 (×13): qty 100
  Filled 2019-07-14: qty 200
  Filled 2019-07-14 (×6): qty 100
  Filled 2019-07-14: qty 200
  Filled 2019-07-14 (×4): qty 100
  Filled 2019-07-14: qty 200
  Filled 2019-07-14 (×17): qty 100

## 2019-07-14 MED ORDER — PANTOPRAZOLE SODIUM 40 MG PO PACK
40.0000 mg | PACK | Freq: Every day | ORAL | Status: DC
  Administered 2019-07-14 – 2019-09-02 (×51): 40 mg
  Filled 2019-07-14 (×49): qty 20

## 2019-07-14 NOTE — Evaluation (Addendum)
Occupational Therapy Evaluation Patient Details Name: Jay Cole MRN: [id number] DOB: 04/24/1996 Today's Date: 07/14/2019    History of Present Illness fracture, however, he underwent I&D of Lt elbow and glass shard was removed, with repeat x-ray confirming absence of foreign body .  Pt developed a subdural hygroma and contusion at the craniotomy defict with significant swelling.  he returned to OR for reexploration of craniectomy debridement of contusion and evacuation of subdural hygroma. repeat CT of head 1/12 showed possible infarct Rt occipital lobe.    Clinical Impression   Pt admitted with above. He demonstrates the below listed deficits and will benefit from continued OT to maximize safety and independence with BADLs.  Pt seen in conjunction with PT.  Pt presents to OT with behaviors consistent with Ranchos II.  He did not respond to auditory stimulation, did not blink to threat (eyes held open), but does withdrawal to pain with Lt UE, and bil. Feet will twitch bil. In response to painful stimulation. He tolerated partial chair position in the bed with vitals remaining stable.  Mother present.  Provided her with TBI booklet and began instruction re: TBI, appropriate interactions, etc.  Disposition unknown at this time and will be dependent on his ability to wean from vent, and how he progresses with therapies.  Anticipate he will require SNF.  Will follow acutely.       Follow Up Recommendations  Other (comment)(disposition will depend on progress and ability to wean. )    Equipment Recommendations  None recommended by OT    Recommendations for Other Services       Precautions / Restrictions Precautions Precautions: Other (comment) Precaution Comments: craniotomy with bone flap in abdomen, trach, PEG      Mobility Bed Mobility               General bed mobility comments: Pt requires total A +2 for all aspects - unable to assist   Transfers                  General transfer comment: unable to attempt     Balance Overall balance assessment: Needs assistance     Sitting balance - Comments: Pt moved into partial chair position, and tolerated well with VSS stable                                    ADL either performed or assessed with clinical judgement   ADL Overall ADL's : Needs assistance/impaired                                       General ADL Comments: Pt requires total A for ADLs - unable to engage in any ADL activity      Vision   Additional Comments: Pt keeps eyes closed.  When eyelids lifted open, he demonstrates dysconjugate gaze wtih Rt eye downward and and abducted, and Lt eye upward and abducted  He does not blink to threat, and pupils minimally reactive      Perception     Praxis      Pertinent Vitals/Pain Pain Assessment: Faces Faces Pain Scale: No hurt     Hand Dominance Right   Extremity/Trunk Assessment Upper Extremity Assessment Upper Extremity Assessment: Defer to OT evaluation RUE Deficits / Details: mild tightness noted with composite extension wrist and  hand.  No active movement noted  RUE Coordination: decreased gross motor;decreased fine motor LUE Deficits / Details: mild tightness noted with composite extension wrist and hand.  No active movement noted  LUE Coordination: decreased fine motor;decreased gross motor   Lower Extremity Assessment Lower Extremity Assessment: LLE deficits/detail;RLE deficits/detail RLE Deficits / Details: PROM WFL, noted no clonus or babinski reflex RLE Coordination: decreased gross motor LLE Deficits / Details: PROM limited ankle DF, mother reports ongoing pain and issues after broke ankle in the past, otherwise PROM WFL LLE Coordination: decreased gross motor       Communication Communication Communication: Tracheostomy   Cognition Arousal/Alertness: Lethargic   Overall Cognitive Status: Impaired/Different from baseline Area of  Impairment: Rancho level               Rancho Levels of Cognitive Functioning Rancho Los Amigos Scales of Cognitive Functioning: Generalized response               General Comments: Pt with eyes closed throughout session.  He does not respond to commands, does not demonstrate auditory startle, does not blink to threat.  He will twitch bil. feet briefly to painful stimuli, and does withdrawal to pain Lt UE.      General Comments  Seen with OT and mother present throughout.  OT issued TBI booklet and educated on Rancho level as well as appropriate levels of stimulation    Exercises Other Exercises Other Exercises: PROM bilat LE's   Shoulder Instructions      Home Living Family/patient expects to be discharged to:: Unsure   Available Help at Discharge: Family;Available 24 hours/day Type of Home: House                           Additional Comments: Disposition will be dependent upon progress and ability to wean from vent - likely will require SNF   Lives With: Other (Comment)(parents )    Prior Functioning/Environment Level of Independence: Independent        Comments: Pt just graduated from high school last year.  He is not employeed, and watches his younger siblings while parents work.  Mom reports he enjoys video games and FB.          OT Problem List: Decreased strength;Decreased range of motion;Decreased activity tolerance;Impaired balance (sitting and/or standing);Impaired vision/perception;Decreased coordination;Decreased cognition;Decreased safety awareness;Decreased knowledge of precautions;Decreased knowledge of use of DME or AE;Cardiopulmonary status limiting activity;Impaired sensation;Impaired tone;Impaired UE functional use      OT Treatment/Interventions: Self-care/ADL training;Neuromuscular education;DME and/or AE instruction;Therapeutic activities;Cognitive remediation/compensation;Patient/family education;Balance  training;Splinting;Visual/perceptual remediation/compensation;Manual therapy    OT Goals(Current goals can be found in the care plan section) Acute Rehab OT Goals Patient Stated Goal: for Jay Cole to wake up  OT Goal Formulation: With patient Time For Goal Achievement: 07/28/19 Potential to Achieve Goals: Good ADL Goals Additional ADL Goal #1: Pt will localize to noxious stimuli 50% of the time Additional ADL Goal #2: Pt will maintain eye opening x 15% of session Additional ADL Goal #3: Pt's mother will be independent with PROM  OT Frequency: Min 2X/week   Barriers to D/C: Decreased caregiver support  unsure if parents will be able to provide necessary level of assist at discharge        Co-evaluation PT/OT/SLP Co-Evaluation/Treatment: Yes Reason for Co-Treatment: Complexity of the patient's impairments (multi-system involvement);Necessary to address cognition/behavior during functional activity;For patient/therapist safety PT goals addressed during session: Other (comment)(cognition) OT goals addressed during session: Other (  comment)(cognition )      AM-PAC OT "6 Clicks" Daily Activity     Outcome Measure Help from another person eating meals?: Total Help from another person taking care of personal grooming?: Total Help from another person toileting, which includes using toliet, bedpan, or urinal?: Total Help from another person bathing (including washing, rinsing, drying)?: Total Help from another person to put on and taking off regular upper body clothing?: Total Help from another person to put on and taking off regular lower body clothing?: Total 6 Click Score: 6   End of Session Nurse Communication: Other (comment)(results of eval )  Activity Tolerance: Other (comment)(limited by cognition ) Patient left: in bed;with call bell/phone within reach;with family/visitor present  OT Visit Diagnosis: Cognitive communication deficit [id number])                Time: [id number] OT Time  Calculation (min): 37 min Charges:  OT General Charges $OT Visit: 1 Visit OT Evaluation $OT Eval High Complexity: 1 High  Nilsa Nutting., OTR/L Acute Rehabilitation Services Pager [phone] Office [phone]   Lucille Passy M 07/14/2019, 5:27 PM

## 2019-07-14 NOTE — Progress Notes (Signed)
Patient ID: Jay Cole, male   DOB: [birth year], 24 y.o.   MRN: [id number] No significant neuro change  Severe TBI very grave and poor prognosis continue supportive care
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
## 2019-07-15 ENCOUNTER — Encounter: Payer: Self-pay | Admitting: *Deleted

## 2019-07-15 ENCOUNTER — Inpatient Hospital Stay (HOSPITAL_COMMUNITY): Payer: Medicaid - Out of State

## 2019-07-15 LAB — COMPREHENSIVE METABOLIC PANEL
ALT: 114 U/L — ABNORMAL HIGH (ref 0–44)
AST: 49 U/L — ABNORMAL HIGH (ref 15–41)
Albumin: 2.3 g/dL — ABNORMAL LOW (ref 3.5–5.0)
Alkaline Phosphatase: 77 U/L (ref 38–126)
Anion gap: 11 (ref 5–15)
BUN: 31 mg/dL — ABNORMAL HIGH (ref 6–20)
CO2: 26 mmol/L (ref 22–32)
Calcium: 8.4 mg/dL — ABNORMAL LOW (ref 8.9–10.3)
Chloride: 111 mmol/L (ref 98–111)
Creatinine, Ser: 0.38 mg/dL — ABNORMAL LOW (ref 0.61–1.24)
GFR calc Af Amer: >60 mL/min (ref >60)
GFR calc non Af Amer: >60 mL/min (ref >60)
Glucose, Bld: 93 mg/dL (ref 70–99)
Potassium: 3.5 mmol/L (ref 3.5–5.1)
Sodium: 148 mmol/L — ABNORMAL HIGH (ref 135–145)
Total Bilirubin: 0.6 mg/dL (ref 0.3–1.2)
Total Protein: 6.4 g/dL — ABNORMAL LOW (ref 6.5–8.1)

## 2019-07-15 LAB — GLUCOSE, CAPILLARY
Glucose-Capillary: 101 mg/dL — ABNORMAL HIGH (ref 70–99)
Glucose-Capillary: 113 mg/dL — ABNORMAL HIGH (ref 70–99)
Glucose-Capillary: 121 mg/dL — ABNORMAL HIGH (ref 70–99)
Glucose-Capillary: 126 mg/dL — ABNORMAL HIGH (ref 70–99)
Glucose-Capillary: 144 mg/dL — ABNORMAL HIGH (ref 70–99)
Glucose-Capillary: 145 mg/dL — ABNORMAL HIGH (ref 70–99)

## 2019-07-15 LAB — CBC
HCT: 33.4 % — ABNORMAL LOW (ref 39.0–52.0)
Hemoglobin: 10 g/dL — ABNORMAL LOW (ref 13.0–17.0)
MCH: 27.6 pg (ref 26.0–34.0)
MCHC: 29.9 g/dL — ABNORMAL LOW (ref 30.0–36.0)
MCV: 92.3 fL (ref 80.0–100.0)
Platelets: 448 10*3/uL — ABNORMAL HIGH (ref 150–400)
RBC: 3.62 MIL/uL — ABNORMAL LOW (ref 4.22–5.81)
RDW: 14.2 % (ref 11.5–15.5)
WBC: 11.5 10*3/uL — ABNORMAL HIGH (ref 4.0–10.5)
nRBC: 0 % (ref 0.0–0.2)

## 2019-07-15 MED ORDER — POTASSIUM CHLORIDE 20 MEQ/15ML (10%) PO SOLN: 40.0000 meq | Freq: Once | ORAL | Status: DC

## 2019-07-15 MED ORDER — FUROSEMIDE 10 MG/ML IJ SOLN
40.0000 mg | Freq: Two times a day (BID) | INTRAMUSCULAR | Status: DC
  Administered 2019-07-15 – 2019-07-16 (×4): 40 mg via INTRAVENOUS
  Administered 2019-07-17: 20 mg via INTRAVENOUS
  Filled 2019-07-15 (×5): qty 4

## 2019-07-15 MED ORDER — POTASSIUM CHLORIDE 20 MEQ/15ML (10%) PO SOLN
40.0000 meq | Freq: Once | ORAL | Status: DC
  Filled 2019-07-15: qty 30

## 2019-07-15 MED ORDER — POTASSIUM CHLORIDE 20 MEQ/15ML (10%) PO SOLN
40.0000 meq | Freq: Once | ORAL | Status: AC
  Administered 2019-07-15: 40 meq

## 2019-07-15 MED FILL — Sodium Chloride IV Soln 0.9%: INTRAVENOUS | Qty: 250 | Status: AC

## 2019-07-15 MED FILL — Fentanyl Citrate Preservative Free (PF) Inj 2500 MCG/50ML: INTRAMUSCULAR | Qty: 50 | Status: AC

## 2019-07-15 NOTE — Progress Notes (Signed)
Orthopedic Tech Progress Note Patient Details:  Jay Cole 01-Jan-1996 [id number] Called in order to [facility] for BILATERAL RESTING HAND SPLINTS  Patient ID: Jay Cole, male   DOB: Jul 14, 1995, 24 y.o.   MRN: [id number]   [name] 07/15/2019, 12:15 PM
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
## 2019-07-15 NOTE — Op Note (Addendum)
Operative Note  Date: 06/25/2019  Procedure: percutaneous tracheostomy without bronchoscopic assistance  Pre-op diagnosis: prolonged mechanical ventilation Post-op diagnosis: same  Indication and clinical history: The patient  is a 24 y.o. year old male with severe TBI and prolonged mechanical ventilation.   Surgeon: [name], MD Assistant: Ileene Rubens, [place]  Anesthesia: general  Findings:  . Specimen: none . EBL: <5cc  . Drains/Implants: #6 Shiley cuffed,   tracheostomy tube  Disposition: ICU  Description of procedure: The patient was positioned supine with a shoulder roll. Time-out was performed verifying correct patient, procedure, signature of informed consent. General anesthetic induction was uneventful and the patient was confirmed to be on 100% FiO2. The neck was prepped and draped in the usual sterile fashion. Palpation of neck anatomy was performed and local anesthesia was infiltrated in the midline neck. A longitudinal incision was made in the neck and deepened down to the trachea. The pilot balloon was deflated and the endotracheal tube slowly retracted proximally until it was palpated just below the cricoid cartilage. An introducer needle was inserted between the second and third tracheal rings under bronchoscopic visualization. A guidewire was passed through the introducer needle and the needle removed. Serial dilation was performed and a #6 cuffed   Shiley and stylet were inserted over the guidewire and the guidewire and stylet removed. The inner cannula was inserted, the pilot balloon on the tracheostomy inflated, and the ventilator tubing disconnected from the endotracheal tube and connected to the tracheostomy. Chest rise, appropriate end-tidal CO2, and return tidal volumes were confirmed. The tracheostomy tube was sutured in four quadrants and a tracheostomy tie applied to the neck. The endotracheal tube was removed. All sponge and instrument counts were correct at the  conclusion of the procedure. The patient tolerated the procedure well. There were no complications.     Procedure Note  Date: 07/07/2019  Procedure: esophagogastroduodenoscopy (EGD) and laparoscopic assisted percutaneous endoscopic gastrostomy (PEG) tube placement  Pre-op diagnosis: dysphagia due to prolonged mechanical ventilation Post-op diagnosis: same  Indication and clinical history: as above  Surgeon: [name], MD Assistant: Ileene Rubens, PA  Anesthesia: General  Findings:  . Specimen: none . EBL: <5cc . Drains/Implants: PEG tube, 5.5cm at the skin   Disposition: ICU/PACU  Description of Procedure: Time-out was performed verifying correct patient, procedure, signature of informed consent. A bite block was placed into the oropharynx. The endoscope was inserted into the oropharynx and advanced down the esophagus into the stomach and into the duodenum. The visualized esophagus and duodenum were unremarkable. The endoscope was retracted back into the stomach and the stomach was insufflated. The stomach was inspected and was also normal. Transillumination was performed and was unable to be visualized externally even with patient repositioning and maximal room darkening. The decision was made to convert to a laparoscopic-assisted approach to ensure no intra-abdominal structures were located between the stomach and abdominal wall preventing the visualization of the light reflex. The abdomen was prepped and draped in the usual sterile fashion and an infra-umbilical incision made and deepened down through the fascia. A Hassan port was inserted and the abdomen insufflated. It was confirmed there was no abdominal structure between the abdominal wall and the stomach. The introducer needle and sheath were inserted under laparoscopic and endoscopic guidance into the stomach. The needle was removed and guidewire inserted. The guidewire was grasped by an endoscopic snare and the snare, guidewire,  and endoscope retracted out of the oropharynx. The PEG tube was secured to the guidewire  and retracted through the mouth and esophagus into the stomach. The PEG tube was secured with a bolster and was visualized endoscopically  to spin freely circumferentially and also visualized laparoscopically to be without gaps between the internal bumper and the stomach wall. There was no evidence of bleeding. The PEG bolster was secured at 5.5cm at the skin and there were no gaps between the bolster and the abdominal wall. The port was removed and the fascia closed with zero vicryl suture. The skin was closed with 4-0 monocryl and dermabond applied as sterile dressing. The stomach was desufflated endoscopically and the endoscope removed. The bite block was also removed. The patient tolerated the procedure well and there were no complications.   The patient may have water and medications administered via the PEG tube beginning immediately and tube feeds may be initiated four hours post-procedure.    [name], MD General and [place] [facility]

## 2019-07-15 NOTE — Progress Notes (Signed)
IV team to bedside to access for peripheral access. Advised not to remove PICC at this time due to limited area to stick and swelling.
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
## 2019-07-16 ENCOUNTER — Encounter: Payer: Self-pay | Admitting: *Deleted

## 2019-07-16 ENCOUNTER — Inpatient Hospital Stay (HOSPITAL_COMMUNITY): Payer: Medicaid - Out of State

## 2019-07-16 LAB — GLUCOSE, CAPILLARY
Glucose-Capillary: 127 mg/dL — ABNORMAL HIGH (ref 70–99)
Glucose-Capillary: 141 mg/dL — ABNORMAL HIGH (ref 70–99)
Glucose-Capillary: 95 mg/dL (ref 70–99)
Glucose-Capillary: 118 mg/dL — ABNORMAL HIGH (ref 70–99)
Glucose-Capillary: 136 mg/dL — ABNORMAL HIGH (ref 70–99)
Glucose-Capillary: 120 mg/dL — ABNORMAL HIGH (ref 70–99)

## 2019-07-16 LAB — PHOSPHORUS: Phosphorus: 6.3 mg/dL — ABNORMAL HIGH (ref 2.5–4.6)

## 2019-07-16 LAB — CBC
HCT: 33.9 % — ABNORMAL LOW (ref 39.0–52.0)
Hemoglobin: 10.4 g/dL — ABNORMAL LOW (ref 13.0–17.0)
MCH: 28 pg (ref 26.0–34.0)
MCHC: 30.7 g/dL (ref 30.0–36.0)
MCV: 91.4 fL (ref 80.0–100.0)
Platelets: 451 10*3/uL — ABNORMAL HIGH (ref 150–400)
RBC: 3.71 MIL/uL — ABNORMAL LOW (ref 4.22–5.81)
RDW: 14.2 % (ref 11.5–15.5)
WBC: 13.4 10*3/uL — ABNORMAL HIGH (ref 4.0–10.5)
nRBC: 0 % (ref 0.0–0.2)

## 2019-07-16 LAB — BASIC METABOLIC PANEL
Anion gap: 12 (ref 5–15)
BUN: 33 mg/dL — ABNORMAL HIGH (ref 6–20)
CO2: 26 mmol/L (ref 22–32)
Calcium: 9.2 mg/dL (ref 8.9–10.3)
Chloride: 109 mmol/L (ref 98–111)
Creatinine, Ser: 0.68 mg/dL (ref 0.61–1.24)
GFR calc Af Amer: >60 mL/min (ref >60)
GFR calc non Af Amer: >60 mL/min (ref >60)
Glucose, Bld: 157 mg/dL — ABNORMAL HIGH (ref 70–99)
Potassium: 3.7 mmol/L (ref 3.5–5.1)
Sodium: 147 mmol/L — ABNORMAL HIGH (ref 135–145)

## 2019-07-16 LAB — MAGNESIUM: Magnesium: 2.4 mg/dL (ref 1.7–2.4)

## 2019-07-16 MED ORDER — PIVOT 1.5 CAL PO LIQD
1000.0000 mL | ORAL | Status: DC
  Administered 2019-07-16 – 2019-09-11 (×61): 1000 mL
  Filled 2019-07-16 (×28): qty 1000

## 2019-07-16 MED ORDER — CLONIDINE HCL 0.2 MG PO TABS
0.2000 mg | ORAL_TABLET | Freq: Four times a day (QID) | ORAL | Status: DC
  Filled 2019-07-16: qty 1

## 2019-07-16 MED ORDER — POTASSIUM CHLORIDE 20 MEQ/15ML (10%) PO SOLN
40.0000 meq | Freq: Once | ORAL | Status: AC
  Administered 2019-07-16: 40 meq

## 2019-07-16 MED ORDER — ENOXAPARIN SODIUM 40 MG/0.4ML ~~LOC~~ SOLN
40.0000 mg | Freq: Two times a day (BID) | SUBCUTANEOUS | Status: DC
  Administered 2019-07-16 – 2019-09-11 (×116): 40 mg via SUBCUTANEOUS
  Filled 2019-07-16 (×118): qty 0.4

## 2019-07-16 MED ORDER — PROPRANOLOL HCL 10 MG PO TABS
10.0000 mg | ORAL_TABLET | Freq: Three times a day (TID) | ORAL | Status: DC
  Administered 2019-07-16 – 2019-07-18 (×6): 10 mg
  Filled 2019-07-16 (×10): qty 1

## 2019-07-16 MED ORDER — PRO-STAT SUGAR FREE PO LIQD
30.0000 mL | Freq: Every day | ORAL | Status: DC
  Administered 2019-07-17 – 2019-08-28 (×43): 30 mL
  Filled 2019-07-16 (×44): qty 30

## 2019-07-16 MED ORDER — PROPRANOLOL HCL 20 MG PO TABS
20.0000 mg | ORAL_TABLET | Freq: Three times a day (TID) | ORAL | Status: DC
  Filled 2019-07-16: qty 1

## 2019-07-16 MED ORDER — CLONIDINE HCL 0.2 MG PO TABS
0.2000 mg | ORAL_TABLET | Freq: Four times a day (QID) | ORAL | Status: DC
  Administered 2019-07-16 – 2019-07-22 (×24): 0.2 mg
  Filled 2019-07-16 (×24): qty 1

## 2019-07-16 MED ORDER — POTASSIUM CHLORIDE 20 MEQ/15ML (10%) PO SOLN
40.0000 meq | Freq: Once | ORAL | Status: DC
  Filled 2019-07-16: qty 30
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
## 2019-07-16 NOTE — Progress Notes (Signed)
Patient ID: Jay Cole, male   DOB: 08-Sep-1995, 24 y.o.   MRN: [id number] Vital signs are stable at present. Patient has had significant autonomic instability as he is emerging from coma.  Flap is much softer and areas are even showing signs of sinking. Continues to require supportive care and weaning will be slow but steady progress is being made.

## 2019-07-17 LAB — BASIC METABOLIC PANEL
Anion gap: 11 (ref 5–15)
BUN: 31 mg/dL — ABNORMAL HIGH (ref 6–20)
CO2: 24 mmol/L (ref 22–32)
Calcium: 8.8 mg/dL — ABNORMAL LOW (ref 8.9–10.3)
Chloride: 106 mmol/L (ref 98–111)
Creatinine, Ser: 0.59 mg/dL — ABNORMAL LOW (ref 0.61–1.24)
GFR calc Af Amer: >60 mL/min (ref >60)
GFR calc non Af Amer: >60 mL/min (ref >60)
Glucose, Bld: 132 mg/dL — ABNORMAL HIGH (ref 70–99)
Potassium: 3.4 mmol/L — ABNORMAL LOW (ref 3.5–5.1)
Sodium: 141 mmol/L (ref 135–145)

## 2019-07-17 LAB — GLUCOSE, CAPILLARY
Glucose-Capillary: 115 mg/dL — ABNORMAL HIGH (ref 70–99)
Glucose-Capillary: 132 mg/dL — ABNORMAL HIGH (ref 70–99)
Glucose-Capillary: 151 mg/dL — ABNORMAL HIGH (ref 70–99)
Glucose-Capillary: 95 mg/dL (ref 70–99)
Glucose-Capillary: 87 mg/dL (ref 70–99)
Glucose-Capillary: 127 mg/dL — ABNORMAL HIGH (ref 70–99)

## 2019-07-17 LAB — CBC
HCT: 36.3 % — ABNORMAL LOW (ref 39.0–52.0)
Hemoglobin: 11.1 g/dL — ABNORMAL LOW (ref 13.0–17.0)
MCH: 27.7 pg (ref 26.0–34.0)
MCHC: 30.6 g/dL (ref 30.0–36.0)
MCV: 90.5 fL (ref 80.0–100.0)
Platelets: 471 10*3/uL — ABNORMAL HIGH (ref 150–400)
RBC: 4.01 MIL/uL — ABNORMAL LOW (ref 4.22–5.81)
RDW: 14.3 % (ref 11.5–15.5)
WBC: 15.7 10*3/uL — ABNORMAL HIGH (ref 4.0–10.5)
nRBC: 0 % (ref 0.0–0.2)

## 2019-07-17 LAB — PHOSPHORUS: Phosphorus: 4.5 mg/dL (ref 2.5–4.6)

## 2019-07-17 LAB — MAGNESIUM: Magnesium: 2.2 mg/dL (ref 1.7–2.4)

## 2019-07-17 MED ORDER — LORAZEPAM 0.5 MG PO TABS
0.5000 mg | ORAL_TABLET | Freq: Two times a day (BID) | ORAL | Status: DC
  Administered 2019-07-17 – 2019-07-22 (×11): 0.5 mg
  Filled 2019-07-17 (×10): qty 1

## 2019-07-17 MED ORDER — FUROSEMIDE 10 MG/ML IJ SOLN
20.0000 mg | Freq: Two times a day (BID) | INTRAMUSCULAR | Status: DC
  Administered 2019-07-17: 20 mg via INTRAVENOUS
  Filled 2019-07-17 (×2): qty 2

## 2019-07-17 MED ORDER — LORAZEPAM 0.5 MG PO TABS
0.5000 mg | ORAL_TABLET | Freq: Two times a day (BID) | ORAL | Status: DC
  Filled 2019-07-17: qty 1

## 2019-07-17 MED ORDER — MIDAZOLAM HCL 2 MG/2ML IJ SOLN
2.0000 mg | INTRAMUSCULAR | Status: DC | PRN
  Administered 2019-07-17 – 2019-07-27 (×55): 2 mg via INTRAVENOUS
  Filled 2019-07-17 (×57): qty 2

## 2019-07-17 NOTE — Progress Notes (Signed)
PT Cancellation Note  Patient Details Name: Jay Cole MRN: [id number] DOB: February 11, 1996   Cancelled Treatment:    Reason Eval/Treat Not Completed: Patient not medically ready.  Presently having neuro storming. 07/17/2019  Jay Carne., PT Acute Rehabilitation Services [phone]  (pager) [phone]  (office)   Jay Cole 07/17/2019, 3:12 PM
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
## 2019-07-17 NOTE — Progress Notes (Signed)
Patient ID: Jay Cole, male   DOB: Nov 05, 1995, 24 y.o.   MRN: [id number] Vital signs are stable No significant changes in status Continue supportive care

## 2019-07-18 DIAGNOSIS — J9621 Acute and chronic respiratory failure with hypoxia: Secondary | ICD-10-CM

## 2019-07-18 LAB — BASIC METABOLIC PANEL
Anion gap: 10 (ref 5–15)
BUN: 24 mg/dL — ABNORMAL HIGH (ref 6–20)
CO2: 26 mmol/L (ref 22–32)
Calcium: 9.2 mg/dL (ref 8.9–10.3)
Chloride: 106 mmol/L (ref 98–111)
Creatinine, Ser: 0.49 mg/dL — ABNORMAL LOW (ref 0.61–1.24)
GFR calc Af Amer: >60 mL/min (ref >60)
GFR calc non Af Amer: >60 mL/min (ref >60)
Glucose, Bld: 145 mg/dL — ABNORMAL HIGH (ref 70–99)
Potassium: 3.5 mmol/L (ref 3.5–5.1)
Sodium: 142 mmol/L (ref 135–145)

## 2019-07-18 LAB — GLUCOSE, CAPILLARY
Glucose-Capillary: 99 mg/dL (ref 70–99)
Glucose-Capillary: 96 mg/dL (ref 70–99)
Glucose-Capillary: 124 mg/dL — ABNORMAL HIGH (ref 70–99)
Glucose-Capillary: 135 mg/dL — ABNORMAL HIGH (ref 70–99)
Glucose-Capillary: 112 mg/dL — ABNORMAL HIGH (ref 70–99)
Glucose-Capillary: 108 mg/dL — ABNORMAL HIGH (ref 70–99)

## 2019-07-18 LAB — PHOSPHORUS: Phosphorus: 4.8 mg/dL — ABNORMAL HIGH (ref 2.5–4.6)

## 2019-07-18 LAB — CBC
HCT: 33 % — ABNORMAL LOW (ref 39.0–52.0)
Hemoglobin: 10.1 g/dL — ABNORMAL LOW (ref 13.0–17.0)
MCH: 27.5 pg (ref 26.0–34.0)
MCHC: 30.6 g/dL (ref 30.0–36.0)
MCV: 89.9 fL (ref 80.0–100.0)
Platelets: 374 10*3/uL (ref 150–400)
RBC: 3.67 MIL/uL — ABNORMAL LOW (ref 4.22–5.81)
RDW: 14 % (ref 11.5–15.5)
WBC: 12.6 10*3/uL — ABNORMAL HIGH (ref 4.0–10.5)
nRBC: 0 % (ref 0.0–0.2)

## 2019-07-18 LAB — MAGNESIUM: Magnesium: 2.3 mg/dL (ref 1.7–2.4)

## 2019-07-18 MED ORDER — FUROSEMIDE 10 MG/ML IJ SOLN
20.0000 mg | Freq: Two times a day (BID) | INTRAMUSCULAR | Status: DC
  Administered 2019-07-18 – 2019-07-26 (×16): 20 mg via INTRAVENOUS
  Filled 2019-07-18 (×16): qty 2

## 2019-07-18 MED FILL — Sodium Chloride IV Soln 0.9%: INTRAVENOUS | Qty: 250 | Status: AC

## 2019-07-18 MED FILL — Fentanyl Citrate Preservative Free (PF) Inj 2500 MCG/50ML: INTRAMUSCULAR | Qty: 50 | Status: AC

## 2019-07-18 NOTE — Progress Notes (Signed)
Patient ID: Jay Cole, male   DOB: 10/15/1995, 24 y.o.   MRN: [id number] Patient continues to have neuro storming, witnessed this am with hr 180, seats and posturing, flexor on right and extensor on left. Continues supportive care. Flap remains soft and pulsatile.
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
## 2019-07-19 LAB — GLUCOSE, CAPILLARY
Glucose-Capillary: 106 mg/dL — ABNORMAL HIGH (ref 70–99)
Glucose-Capillary: 111 mg/dL — ABNORMAL HIGH (ref 70–99)
Glucose-Capillary: 114 mg/dL — ABNORMAL HIGH (ref 70–99)
Glucose-Capillary: 119 mg/dL — ABNORMAL HIGH (ref 70–99)
Glucose-Capillary: 137 mg/dL — ABNORMAL HIGH (ref 70–99)
Glucose-Capillary: 164 mg/dL — ABNORMAL HIGH (ref 70–99)

## 2019-07-19 MED ORDER — METHOCARBAMOL 500 MG PO TABS
1000.0000 mg | ORAL_TABLET | Freq: Three times a day (TID) | ORAL | Status: DC
  Administered 2019-07-19 – 2019-09-11 (×164): 1000 mg
  Filled 2019-07-19 (×165): qty 2

## 2019-07-19 MED ORDER — PROPRANOLOL HCL 20 MG PO TABS
20.0000 mg | ORAL_TABLET | Freq: Three times a day (TID) | ORAL | Status: DC
  Administered 2019-07-19 (×2): 20 mg
  Filled 2019-07-19 (×3): qty 1

## 2019-07-19 MED ORDER — LEVETIRACETAM 100 MG/ML PO SOLN
500.0000 mg | Freq: Two times a day (BID) | ORAL | Status: DC
  Administered 2019-07-19 – 2019-09-11 (×109): 500 mg
  Filled 2019-07-19 (×111): qty 5
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
## 2019-07-19 NOTE — Progress Notes (Signed)
SLP Cancellation Note  Patient Details Name: Jay Cole MRN: [id number] DOB: 21-Jul-1995   Cancelled treatment:       Reason Eval/Treat Not Completed: Medical issues which prohibited therapy - pt neurostorming. Will f/u as able.    Osie Bond., M.A. [place] Acute Rehabilitation Services Pager [phone] Office [phone]  07/19/2019, 11:23 AM

## 2019-07-20 LAB — GLUCOSE, CAPILLARY
Glucose-Capillary: 105 mg/dL — ABNORMAL HIGH (ref 70–99)
Glucose-Capillary: 114 mg/dL — ABNORMAL HIGH (ref 70–99)
Glucose-Capillary: 126 mg/dL — ABNORMAL HIGH (ref 70–99)
Glucose-Capillary: 131 mg/dL — ABNORMAL HIGH (ref 70–99)
Glucose-Capillary: 137 mg/dL — ABNORMAL HIGH (ref 70–99)
Glucose-Capillary: 182 mg/dL — ABNORMAL HIGH (ref 70–99)

## 2019-07-20 MED ORDER — IBUPROFEN 100 MG/5ML PO SUSP
400.0000 mg | Freq: Three times a day (TID) | ORAL | Status: DC | PRN
  Administered 2019-07-20 – 2019-08-07 (×13): 400 mg
  Filled 2019-07-20 (×13): qty 20

## 2019-07-20 MED ORDER — PROPRANOLOL HCL 20 MG PO TABS
30.0000 mg | ORAL_TABLET | Freq: Three times a day (TID) | ORAL | Status: DC
  Administered 2019-07-20 – 2019-09-11 (×153): 30 mg
  Filled 2019-07-20 (×170): qty 1

## 2019-07-20 NOTE — Progress Notes (Signed)
Trach sutures were removed without complications. RN at bedside for assistance.
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
## 2019-07-21 ENCOUNTER — Inpatient Hospital Stay (HOSPITAL_COMMUNITY): Payer: Medicaid - Out of State

## 2019-07-21 LAB — GLUCOSE, CAPILLARY
Glucose-Capillary: 159 mg/dL — ABNORMAL HIGH (ref 70–99)
Glucose-Capillary: 137 mg/dL — ABNORMAL HIGH (ref 70–99)
Glucose-Capillary: 129 mg/dL — ABNORMAL HIGH (ref 70–99)
Glucose-Capillary: 121 mg/dL — ABNORMAL HIGH (ref 70–99)
Glucose-Capillary: 183 mg/dL — ABNORMAL HIGH (ref 70–99)
Glucose-Capillary: 94 mg/dL (ref 70–99)

## 2019-07-21 LAB — CBC
HCT: 37 % — ABNORMAL LOW (ref 39.0–52.0)
Hemoglobin: 11.5 g/dL — ABNORMAL LOW (ref 13.0–17.0)
MCH: 27.6 pg (ref 26.0–34.0)
MCHC: 31.1 g/dL (ref 30.0–36.0)
MCV: 88.9 fL (ref 80.0–100.0)
Platelets: 437 10*3/uL — ABNORMAL HIGH (ref 150–400)
RBC: 4.16 MIL/uL — ABNORMAL LOW (ref 4.22–5.81)
RDW: 14.4 % (ref 11.5–15.5)
WBC: 20.3 10*3/uL — ABNORMAL HIGH (ref 4.0–10.5)
nRBC: 0 % (ref 0.0–0.2)

## 2019-07-21 LAB — BASIC METABOLIC PANEL
Anion gap: 11 (ref 5–15)
BUN: 31 mg/dL — ABNORMAL HIGH (ref 6–20)
CO2: 23 mmol/L (ref 22–32)
Calcium: 8.4 mg/dL — ABNORMAL LOW (ref 8.9–10.3)
Chloride: 109 mmol/L (ref 98–111)
Creatinine, Ser: 0.87 mg/dL (ref 0.61–1.24)
GFR calc Af Amer: >60 mL/min (ref >60)
GFR calc non Af Amer: >60 mL/min (ref >60)
Glucose, Bld: 160 mg/dL — ABNORMAL HIGH (ref 70–99)
Potassium: 3.5 mmol/L (ref 3.5–5.1)
Sodium: 143 mmol/L (ref 135–145)
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
## 2019-07-22 ENCOUNTER — Inpatient Hospital Stay (HOSPITAL_COMMUNITY): Payer: Medicaid - Out of State

## 2019-07-22 LAB — GLUCOSE, CAPILLARY
Glucose-Capillary: 168 mg/dL — ABNORMAL HIGH (ref 70–99)
Glucose-Capillary: 154 mg/dL — ABNORMAL HIGH (ref 70–99)
Glucose-Capillary: 102 mg/dL — ABNORMAL HIGH (ref 70–99)
Glucose-Capillary: 122 mg/dL — ABNORMAL HIGH (ref 70–99)
Glucose-Capillary: 190 mg/dL — ABNORMAL HIGH (ref 70–99)
Glucose-Capillary: 173 mg/dL — ABNORMAL HIGH (ref 70–99)

## 2019-07-22 LAB — URINALYSIS, ROUTINE W REFLEX MICROSCOPIC
Bilirubin Urine: NEGATIVE
Glucose, UA: NEGATIVE mg/dL
Hgb urine dipstick: NEGATIVE
Ketones, ur: NEGATIVE mg/dL
Leukocytes,Ua: NEGATIVE
Nitrite: NEGATIVE
Protein, ur: NEGATIVE mg/dL
Specific Gravity, Urine: 1.028 (ref 1.005–1.030)
pH: 7 (ref 5.0–8.0)

## 2019-07-22 LAB — MAGNESIUM: Magnesium: 2.2 mg/dL (ref 1.7–2.4)

## 2019-07-22 LAB — BASIC METABOLIC PANEL
Anion gap: 12 (ref 5–15)
BUN: 22 mg/dL — ABNORMAL HIGH (ref 6–20)
CO2: 29 mmol/L (ref 22–32)
Calcium: 8.8 mg/dL — ABNORMAL LOW (ref 8.9–10.3)
Chloride: 102 mmol/L (ref 98–111)
Creatinine, Ser: 0.41 mg/dL — ABNORMAL LOW (ref 0.61–1.24)
GFR calc Af Amer: >60 mL/min (ref >60)
GFR calc non Af Amer: >60 mL/min (ref >60)
Glucose, Bld: 153 mg/dL — ABNORMAL HIGH (ref 70–99)
Potassium: 3.7 mmol/L (ref 3.5–5.1)
Sodium: 143 mmol/L (ref 135–145)

## 2019-07-22 LAB — CBC
HCT: 38.6 % — ABNORMAL LOW (ref 39.0–52.0)
Hemoglobin: 11.5 g/dL — ABNORMAL LOW (ref 13.0–17.0)
MCH: 27.5 pg (ref 26.0–34.0)
MCHC: 29.8 g/dL — ABNORMAL LOW (ref 30.0–36.0)
MCV: 92.3 fL (ref 80.0–100.0)
Platelets: 421 10*3/uL — ABNORMAL HIGH (ref 150–400)
RBC: 4.18 MIL/uL — ABNORMAL LOW (ref 4.22–5.81)
RDW: 14.4 % (ref 11.5–15.5)
WBC: 26.5 10*3/uL — ABNORMAL HIGH (ref 4.0–10.5)
nRBC: 0 % (ref 0.0–0.2)

## 2019-07-22 LAB — PHOSPHORUS: Phosphorus: 4 mg/dL (ref 2.5–4.6)

## 2019-07-22 MED ORDER — VANCOMYCIN HCL 1750 MG/350ML IV SOLN
1750.0000 mg | Freq: Two times a day (BID) | INTRAVENOUS | Status: DC
  Administered 2019-07-23 – 2019-07-24 (×3): 1750 mg via INTRAVENOUS
  Filled 2019-07-22 (×3): qty 350

## 2019-07-22 MED ORDER — CLONIDINE HCL 0.2 MG PO TABS
0.3000 mg | ORAL_TABLET | Freq: Four times a day (QID) | ORAL | Status: DC
  Administered 2019-07-22 – 2019-09-10 (×178): 0.3 mg
  Filled 2019-07-22 (×186): qty 1

## 2019-07-22 MED ORDER — LORAZEPAM 1 MG PO TABS
1.0000 mg | ORAL_TABLET | Freq: Two times a day (BID) | ORAL | Status: DC
  Administered 2019-07-22: 22:00:00 1 mg
  Filled 2019-07-22: qty 1

## 2019-07-22 MED ORDER — VANCOMYCIN HCL 10 G IV SOLR
2500.0000 mg | Freq: Once | INTRAVENOUS | Status: AC
  Administered 2019-07-22: 2500 mg via INTRAVENOUS
  Filled 2019-07-22: qty 2500

## 2019-07-22 MED ORDER — SODIUM CHLORIDE 0.9 % IV SOLN
2.0000 g | Freq: Three times a day (TID) | INTRAVENOUS | Status: DC
  Administered 2019-07-22 – 2019-07-24 (×6): 2 g via INTRAVENOUS
  Filled 2019-07-22 (×10): qty 2
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
## 2019-07-22 NOTE — Progress Notes (Addendum)
Pharmacy Antibiotic Note  Jay Cole is a 24 y.o. male admitted on 07/17/2019 with persistent fever.  Pharmacy has been consulted for Vancomycin and Cefepime dosing.  Worsening leukocytosis, continuing and rising fever at Tmax 104.2. Cultures resent 2/1.  SCr low at 0.41 - stable, may be falsely low as muscle mass decreases.   Penicillin allergy noted - patient tolerated Cefazolin for 2 days earlier this stay. Will monitor.   Plan: Vancomycin 2500mg  IV x1 then 1750 mg IV every 12 hours.  Goal AUC 400-550. Expected AUC 518. SCr used: 0.8 Cefepime 2g IV every 8 hours. Monitor renal function, culture results, and clinical status.  Monitor for any signs of reaction. --Discussed with RN Ander Purpura) this PM.   Height: 5\' 7"  (170.2 cm) Weight: 253 lb 1.4 oz (114.8 kg) IBW/kg (Calculated) : 66.1  Temp (24hrs), Avg:102.1 F (38.9 C), Min:99.2 F (37.3 C), Max:104.2 F (40.1 C)  Recent Labs  Lab 07/16/19 0517 07/17/19 0518 07/18/19 0521 07/21/19 0530 07/22/19 0624  WBC 13.4* 15.7* 12.6* 20.3* 26.5*  CREATININE 0.68 0.59* 0.49* 0.87 0.41*    Estimated Creatinine Clearance: 176.8 mL/min (A) (by C-G formula based on SCr of 0.41 mg/dL (L)).    Allergies  Allergen Reactions  . Penicillins Anaphylaxis    Tolerates cephalosporins  Did it involve swelling of the face/tongue/throat, SOB, or low BP? Yes Did it involve sudden or severe rash/hives, skin peeling, or any reaction on the inside of your mouth or nose? No Did you need to seek medical attention at a hospital or doctor's office? Yes When did it last happen?4 or 5 years ago If all above answers are "NO", may proceed with cephalosporin use.   . Codeine Other (See Comments)    Heart palpitations  . Tramadol Hives and Nausea And Vomiting    Antimicrobials this admission: 1/12 levaquin >>1/13 1/13 cefazolin >> 1/15 1/15 Vanc >>1/18 1/15 Merrem >> 1/21  Dose adjustments this admission:   Microbiology results: 1/4  covid / flu - negative 1/5 MRSA PCR - negative 1/11 TA - mssa  1/11 Bld - negative 1/15 Resp Cx >> MSSA 1/15 BCx >> neg  2/1 Bld CX >> 2/1 Resp Cx >>  Thank you for allowing pharmacy to be a part of this patient's care.  Sloan Leiter, PharmD, BCPS, BCCCP Clinical Pharmacist Please refer to [facility] for [place] numbers 07/22/2019 3:11 PM

## 2019-07-22 NOTE — Progress Notes (Signed)
Messaged Dr. Bobbye Morton regarding temperature of 104.6 despite, Motrin, Tylenol, cooling blanket, and ice packs. New orders for blood and respiratory cultures and UA. Pharmacy consults for antbx. Fever beginning to trend down. Will continue to monitor.

## 2019-07-22 NOTE — Consult Note (Signed)
[facility] Nurse Consult Note: Patient receiving care at [facility] RN present during time of assessment.   Reason for Consult:Trach site wound Wound type: full thickness breakdown under the Trach face plate area with a large amount of drainage present.  Recommendation for treatment is to apply a Drawtex Trach dressing under the trach face plate, change daily and PRN. Drainage (amount, consistency, odor) Large amount of thin drainage some yellow tinged with no odor present. Periwound: pink but blanchable very moist.  Monitor the wound area's for worsening of condition such as, Signs/symptoms of infection, Increase in size, development of or worsening of odor, development of pain, or increased pain at the affected locations.  Notify the medical team if any of these develop.  Thank you for the consult.  Discussed plan of care with the bedside nurse.  [facility] nurse will not follow at this time.  Please re-consult the [facility] if needed.    Austin Miles, RN, [employer]
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
## 2019-07-22 NOTE — Anesthesia Postprocedure Evaluation (Signed)
Anesthesia Post Note  Patient: Jay Cole  Procedure(s) Performed: TRACHEOSTOMY (N/A Throat) Laparascopic Assisted PEG tube placement (N/A ) Esophagogastroduodenoscopy (Egd)     Anesthesia Post Evaluation  Last Vitals:  Vitals:   07/22/19 1515 07/22/19 1600  BP:  (!) 138/92  Pulse:  (!) 135  Resp:  (!) 40  Temp:  (!) 39.8 C  SpO2: 95% 95%    Last Pain:  Vitals:   07/22/19 1200  TempSrc: Axillary  PainSc:                  Jay Cole

## 2019-07-22 DEATH — deceased

## 2019-07-23 ENCOUNTER — Encounter (HOSPITAL_COMMUNITY): Payer: Self-pay | Admitting: Anesthesiology

## 2019-07-23 DIAGNOSIS — J9621 Acute and chronic respiratory failure with hypoxia: Secondary | ICD-10-CM

## 2019-07-23 DIAGNOSIS — L8990 Pressure ulcer of unspecified site, unspecified stage: Secondary | ICD-10-CM | POA: Insufficient documentation

## 2019-07-23 LAB — BASIC METABOLIC PANEL
Anion gap: 10 (ref 5–15)
BUN: 26 mg/dL — ABNORMAL HIGH (ref 6–20)
CO2: 29 mmol/L (ref 22–32)
Calcium: 8.2 mg/dL — ABNORMAL LOW (ref 8.9–10.3)
Chloride: 106 mmol/L (ref 98–111)
Creatinine, Ser: 0.54 mg/dL — ABNORMAL LOW (ref 0.61–1.24)
GFR calc Af Amer: >60 mL/min (ref >60)
GFR calc non Af Amer: >60 mL/min (ref >60)
Glucose, Bld: 167 mg/dL — ABNORMAL HIGH (ref 70–99)
Potassium: 2.8 mmol/L — ABNORMAL LOW (ref 3.5–5.1)
Sodium: 145 mmol/L (ref 135–145)

## 2019-07-23 LAB — GLUCOSE, CAPILLARY
Glucose-Capillary: 156 mg/dL — ABNORMAL HIGH (ref 70–99)
Glucose-Capillary: 158 mg/dL — ABNORMAL HIGH (ref 70–99)
Glucose-Capillary: 100 mg/dL — ABNORMAL HIGH (ref 70–99)
Glucose-Capillary: 116 mg/dL — ABNORMAL HIGH (ref 70–99)
Glucose-Capillary: 178 mg/dL — ABNORMAL HIGH (ref 70–99)
Glucose-Capillary: 119 mg/dL — ABNORMAL HIGH (ref 70–99)

## 2019-07-23 LAB — CBC
HCT: 33.6 % — ABNORMAL LOW (ref 39.0–52.0)
Hemoglobin: 9.9 g/dL — ABNORMAL LOW (ref 13.0–17.0)
MCH: 27.6 pg (ref 26.0–34.0)
MCHC: 29.5 g/dL — ABNORMAL LOW (ref 30.0–36.0)
MCV: 93.6 fL (ref 80.0–100.0)
Platelets: 296 K/uL (ref 150–400)
RBC: 3.59 MIL/uL — ABNORMAL LOW (ref 4.22–5.81)
RDW: 14.5 % (ref 11.5–15.5)
WBC: 17.3 K/uL — ABNORMAL HIGH (ref 4.0–10.5)
nRBC: 0 % (ref 0.0–0.2)

## 2019-07-23 LAB — PHOSPHORUS: Phosphorus: 3.2 mg/dL (ref 2.5–4.6)

## 2019-07-23 LAB — MAGNESIUM: Magnesium: 2.2 mg/dL (ref 1.7–2.4)

## 2019-07-23 MED ORDER — QUETIAPINE FUMARATE 25 MG PO TABS
50.0000 mg | ORAL_TABLET | Freq: Two times a day (BID) | ORAL | Status: DC
  Administered 2019-07-23 – 2019-07-28 (×11): 50 mg
  Filled 2019-07-23 (×11): qty 2

## 2019-07-23 MED ORDER — LORAZEPAM 1 MG PO TABS
1.0000 mg | ORAL_TABLET | Freq: Three times a day (TID) | ORAL | Status: DC
  Administered 2019-07-23 – 2019-07-28 (×18): 1 mg
  Filled 2019-07-23 (×19): qty 1

## 2019-07-23 MED ORDER — CLONAZEPAM 0.5 MG PO TABS: 0.5000 mg | ORAL_TABLET | Freq: Two times a day (BID) | ORAL | Status: DC

## 2019-07-23 MED ORDER — POTASSIUM CHLORIDE 20 MEQ/15ML (10%) PO SOLN
40.0000 meq | Freq: Two times a day (BID) | ORAL | Status: DC
  Administered 2019-07-23 – 2019-07-25 (×6): 40 meq
  Filled 2019-07-23 (×6): qty 30
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
## 2019-07-24 LAB — CULTURE, RESPIRATORY W GRAM STAIN

## 2019-07-24 LAB — CBC
HCT: 30.3 % — ABNORMAL LOW (ref 39.0–52.0)
Hemoglobin: 9 g/dL — ABNORMAL LOW (ref 13.0–17.0)
MCH: 27.4 pg (ref 26.0–34.0)
MCHC: 29.7 g/dL — ABNORMAL LOW (ref 30.0–36.0)
MCV: 92.4 fL (ref 80.0–100.0)
Platelets: 253 10*3/uL (ref 150–400)
RBC: 3.28 MIL/uL — ABNORMAL LOW (ref 4.22–5.81)
RDW: 14.4 % (ref 11.5–15.5)
WBC: 10.2 10*3/uL (ref 4.0–10.5)
nRBC: 0 % (ref 0.0–0.2)

## 2019-07-24 LAB — GLUCOSE, CAPILLARY
Glucose-Capillary: 110 mg/dL — ABNORMAL HIGH (ref 70–99)
Glucose-Capillary: 161 mg/dL — ABNORMAL HIGH (ref 70–99)
Glucose-Capillary: 126 mg/dL — ABNORMAL HIGH (ref 70–99)
Glucose-Capillary: 103 mg/dL — ABNORMAL HIGH (ref 70–99)
Glucose-Capillary: 135 mg/dL — ABNORMAL HIGH (ref 70–99)
Glucose-Capillary: 136 mg/dL — ABNORMAL HIGH (ref 70–99)

## 2019-07-24 LAB — BASIC METABOLIC PANEL
Anion gap: 16 — ABNORMAL HIGH (ref 5–15)
BUN: 24 mg/dL — ABNORMAL HIGH (ref 6–20)
CO2: 25 mmol/L (ref 22–32)
Calcium: 8.7 mg/dL — ABNORMAL LOW (ref 8.9–10.3)
Chloride: 106 mmol/L (ref 98–111)
Creatinine, Ser: 0.45 mg/dL — ABNORMAL LOW (ref 0.61–1.24)
GFR calc Af Amer: >60 mL/min (ref >60)
GFR calc non Af Amer: >60 mL/min (ref >60)
Glucose, Bld: 148 mg/dL — ABNORMAL HIGH (ref 70–99)
Potassium: 3.2 mmol/L — ABNORMAL LOW (ref 3.5–5.1)
Sodium: 147 mmol/L — ABNORMAL HIGH (ref 135–145)

## 2019-07-24 LAB — MAGNESIUM: Magnesium: 2.1 mg/dL (ref 1.7–2.4)

## 2019-07-24 LAB — PHOSPHORUS: Phosphorus: 2.8 mg/dL (ref 2.5–4.6)

## 2019-07-24 MED ORDER — POTASSIUM PHOSPHATES 15 MMOLE/5ML IV SOLN
10.0000 mmol | Freq: Once | INTRAVENOUS | Status: AC
  Administered 2019-07-24: 10 mmol via INTRAVENOUS
  Filled 2019-07-24: qty 3.33

## 2019-07-24 MED ORDER — SODIUM CHLORIDE 0.9 % IV SOLN
2.0000 g | INTRAVENOUS | Status: AC
  Administered 2019-07-24 – 2019-07-31 (×8): 2 g via INTRAVENOUS
  Filled 2019-07-24 (×2): qty 2
  Filled 2019-07-24: qty 20
  Filled 2019-07-24 (×6): qty 2
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
## 2019-07-24 NOTE — Progress Notes (Signed)
Patient ID: Jay Cole, male   DOB: [birth year], 24 y.o.   MRN: [id number] Vitals stable currently but still easily provoked hypertension and tachycardia with extensor posturing with mild stimulation.  Small area of crust on posterior portion of scalp appears healed but concern for pressure breakdown secondary to patients posturing is being addressed with donut. Cultures from Monday negative thus far. Continues supportive care and vent support.

## 2019-07-25 LAB — GLUCOSE, CAPILLARY
Glucose-Capillary: 141 mg/dL — ABNORMAL HIGH (ref 70–99)
Glucose-Capillary: 131 mg/dL — ABNORMAL HIGH (ref 70–99)
Glucose-Capillary: 128 mg/dL — ABNORMAL HIGH (ref 70–99)
Glucose-Capillary: 107 mg/dL — ABNORMAL HIGH (ref 70–99)
Glucose-Capillary: 164 mg/dL — ABNORMAL HIGH (ref 70–99)
Glucose-Capillary: 126 mg/dL — ABNORMAL HIGH (ref 70–99)
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
## 2019-07-25 NOTE — Progress Notes (Signed)
Patient ID: Jay Cole, male   DOB: 20-Jan-1996, 24 y.o.   MRN: [id number] I called his mother and updated her. I answered her questions.  Georganna Skeans, MD, MPH, FACS Please use [email or web address] to contact on call provider\

## 2019-07-26 ENCOUNTER — Inpatient Hospital Stay (HOSPITAL_COMMUNITY): Payer: Medicaid - Out of State

## 2019-07-26 LAB — BASIC METABOLIC PANEL
Anion gap: 9 (ref 5–15)
BUN: 24 mg/dL — ABNORMAL HIGH (ref 6–20)
CO2: 26 mmol/L (ref 22–32)
Calcium: 8.6 mg/dL — ABNORMAL LOW (ref 8.9–10.3)
Chloride: 108 mmol/L (ref 98–111)
Creatinine, Ser: 0.44 mg/dL — ABNORMAL LOW (ref 0.61–1.24)
GFR calc Af Amer: >60 mL/min (ref >60)
GFR calc non Af Amer: >60 mL/min (ref >60)
Glucose, Bld: 78 mg/dL (ref 70–99)
Potassium: 4.7 mmol/L (ref 3.5–5.1)
Sodium: 143 mmol/L (ref 135–145)

## 2019-07-26 LAB — GLUCOSE, CAPILLARY
Glucose-Capillary: 132 mg/dL — ABNORMAL HIGH (ref 70–99)
Glucose-Capillary: 126 mg/dL — ABNORMAL HIGH (ref 70–99)
Glucose-Capillary: 140 mg/dL — ABNORMAL HIGH (ref 70–99)
Glucose-Capillary: 121 mg/dL — ABNORMAL HIGH (ref 70–99)
Glucose-Capillary: 134 mg/dL — ABNORMAL HIGH (ref 70–99)
Glucose-Capillary: 73 mg/dL (ref 70–99)

## 2019-07-26 LAB — CBC
HCT: 35 % — ABNORMAL LOW (ref 39.0–52.0)
Hemoglobin: 10.4 g/dL — ABNORMAL LOW (ref 13.0–17.0)
MCH: 27.3 pg (ref 26.0–34.0)
MCHC: 29.7 g/dL — ABNORMAL LOW (ref 30.0–36.0)
MCV: 91.9 fL (ref 80.0–100.0)
Platelets: 300 10*3/uL (ref 150–400)
RBC: 3.81 MIL/uL — ABNORMAL LOW (ref 4.22–5.81)
RDW: 14.3 % (ref 11.5–15.5)
WBC: 10.1 10*3/uL (ref 4.0–10.5)
nRBC: 0 % (ref 0.0–0.2)

## 2019-07-26 MED ORDER — FUROSEMIDE 10 MG/ML IJ SOLN
20.0000 mg | Freq: Every day | INTRAMUSCULAR | Status: DC
  Administered 2019-07-27 – 2019-08-10 (×15): 20 mg via INTRAVENOUS
  Filled 2019-07-26 (×17): qty 2
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
## 2019-07-26 NOTE — Progress Notes (Signed)
SLP Cancellation Note  Patient Details Name: Jay Cole MRN: [id number] DOB: 08/17/1995   Cancelled treatment:       Reason Eval/Treat Not Completed: Medical issues which prohibited therapy. Pt continues to have severe neuro storming events with stimulation per RN and has required large amounts of sedation. Will defer treatment today as there is no therapeutic benefit. Will f/u 1x a week to monitor for progress.    Shadrack Brummitt, Katherene Ponto 07/26/2019, 12:28 PM

## 2019-07-27 LAB — BASIC METABOLIC PANEL
Anion gap: 8 (ref 5–15)
BUN: 24 mg/dL — ABNORMAL HIGH (ref 6–20)
CO2: 26 mmol/L (ref 22–32)
Calcium: 8.9 mg/dL (ref 8.9–10.3)
Chloride: 105 mmol/L (ref 98–111)
Creatinine, Ser: 0.52 mg/dL — ABNORMAL LOW (ref 0.61–1.24)
GFR calc Af Amer: >60 mL/min (ref >60)
GFR calc non Af Amer: >60 mL/min (ref >60)
Glucose, Bld: 143 mg/dL — ABNORMAL HIGH (ref 70–99)
Potassium: 4.4 mmol/L (ref 3.5–5.1)
Sodium: 139 mmol/L (ref 135–145)

## 2019-07-27 LAB — GLUCOSE, CAPILLARY
Glucose-Capillary: 102 mg/dL — ABNORMAL HIGH (ref 70–99)
Glucose-Capillary: 137 mg/dL — ABNORMAL HIGH (ref 70–99)
Glucose-Capillary: 113 mg/dL — ABNORMAL HIGH (ref 70–99)
Glucose-Capillary: 134 mg/dL — ABNORMAL HIGH (ref 70–99)
Glucose-Capillary: 120 mg/dL — ABNORMAL HIGH (ref 70–99)
Glucose-Capillary: 122 mg/dL — ABNORMAL HIGH (ref 70–99)

## 2019-07-27 LAB — CBC
HCT: 35 % — ABNORMAL LOW (ref 39.0–52.0)
Hemoglobin: 10.3 g/dL — ABNORMAL LOW (ref 13.0–17.0)
MCH: 27.2 pg (ref 26.0–34.0)
MCHC: 29.4 g/dL — ABNORMAL LOW (ref 30.0–36.0)
MCV: 92.6 fL (ref 80.0–100.0)
Platelets: 320 10*3/uL (ref 150–400)
RBC: 3.78 MIL/uL — ABNORMAL LOW (ref 4.22–5.81)
RDW: 14.3 % (ref 11.5–15.5)
WBC: 9.4 10*3/uL (ref 4.0–10.5)
nRBC: 0 % (ref 0.0–0.2)

## 2019-07-27 LAB — CULTURE, BLOOD (ROUTINE X 2)
Culture: NO GROWTH
Culture: NO GROWTH
Special Requests: ADEQUATE
Special Requests: ADEQUATE

## 2019-07-27 MED ORDER — MIDAZOLAM BOLUS VIA INFUSION
1.0000 mg | INTRAVENOUS | Status: DC | PRN
  Administered 2019-07-27: 16:00:00 2 mg via INTRAVENOUS
  Filled 2019-07-27: qty 2

## 2019-07-27 MED ORDER — MIDAZOLAM 50MG/50ML (1MG/ML) PREMIX INFUSION
0.0000 mg/h | INTRAVENOUS | Status: DC
  Administered 2019-07-27: 6 mg/h via INTRAVENOUS
  Administered 2019-07-27: 15:00:00 3 mg/h via INTRAVENOUS
  Administered 2019-07-28 – 2019-07-30 (×6): 6 mg/h via INTRAVENOUS
  Administered 2019-07-30: 4 mg/h via INTRAVENOUS
  Administered 2019-07-30: 08:00:00 6 mg/h via INTRAVENOUS
  Administered 2019-07-31: 4 mg/h via INTRAVENOUS
  Filled 2019-07-27 (×11): qty 50
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
## 2019-07-27 NOTE — Progress Notes (Signed)
Patient began neuro storming shortly before 1200 with HR between 140-200 and BP>200, all PRNs for symptom mgmt have been exhausted. Trauma MD notified, Versed gtt ordered. Will continue to monitor.  Candy Sledge, RN

## 2019-07-28 ENCOUNTER — Inpatient Hospital Stay (HOSPITAL_COMMUNITY): Payer: Medicaid - Out of State

## 2019-07-28 LAB — GLUCOSE, CAPILLARY
Glucose-Capillary: 122 mg/dL — ABNORMAL HIGH (ref 70–99)
Glucose-Capillary: 120 mg/dL — ABNORMAL HIGH (ref 70–99)
Glucose-Capillary: 148 mg/dL — ABNORMAL HIGH (ref 70–99)
Glucose-Capillary: 106 mg/dL — ABNORMAL HIGH (ref 70–99)
Glucose-Capillary: 136 mg/dL — ABNORMAL HIGH (ref 70–99)

## 2019-07-28 MED ORDER — QUETIAPINE FUMARATE 100 MG PO TABS
100.0000 mg | ORAL_TABLET | Freq: Two times a day (BID) | ORAL | Status: DC
  Administered 2019-07-28: 100 mg
  Filled 2019-07-28 (×2): qty 1

## 2019-07-28 MED ORDER — IPRATROPIUM BROMIDE 0.02 % IN SOLN: 0.5000 mg | Freq: Four times a day (QID) | RESPIRATORY_TRACT | Status: DC | PRN

## 2019-07-28 MED ORDER — LEVALBUTEROL HCL 0.63 MG/3ML IN NEBU: 0.6300 mg | INHALATION_SOLUTION | Freq: Four times a day (QID) | RESPIRATORY_TRACT | Status: DC | PRN
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
## 2019-07-29 ENCOUNTER — Inpatient Hospital Stay (HOSPITAL_COMMUNITY): Payer: Medicaid - Out of State

## 2019-07-29 LAB — GLUCOSE, CAPILLARY
Glucose-Capillary: 110 mg/dL — ABNORMAL HIGH (ref 70–99)
Glucose-Capillary: 112 mg/dL — ABNORMAL HIGH (ref 70–99)
Glucose-Capillary: 113 mg/dL — ABNORMAL HIGH (ref 70–99)
Glucose-Capillary: 115 mg/dL — ABNORMAL HIGH (ref 70–99)
Glucose-Capillary: 130 mg/dL — ABNORMAL HIGH (ref 70–99)
Glucose-Capillary: 132 mg/dL — ABNORMAL HIGH (ref 70–99)
Glucose-Capillary: 135 mg/dL — ABNORMAL HIGH (ref 70–99)

## 2019-07-29 LAB — BASIC METABOLIC PANEL
Anion gap: 11 (ref 5–15)
BUN: 23 mg/dL — ABNORMAL HIGH (ref 6–20)
CO2: 27 mmol/L (ref 22–32)
Calcium: 9 mg/dL (ref 8.9–10.3)
Chloride: 104 mmol/L (ref 98–111)
Creatinine, Ser: 0.49 mg/dL — ABNORMAL LOW (ref 0.61–1.24)
GFR calc Af Amer: >60 mL/min (ref >60)
GFR calc non Af Amer: >60 mL/min (ref >60)
Glucose, Bld: 130 mg/dL — ABNORMAL HIGH (ref 70–99)
Potassium: 4.1 mmol/L (ref 3.5–5.1)
Sodium: 142 mmol/L (ref 135–145)

## 2019-07-29 LAB — CBC
HCT: 37.2 % — ABNORMAL LOW (ref 39.0–52.0)
Hemoglobin: 11.1 g/dL — ABNORMAL LOW (ref 13.0–17.0)
MCH: 27.8 pg (ref 26.0–34.0)
MCHC: 29.8 g/dL — ABNORMAL LOW (ref 30.0–36.0)
MCV: 93 fL (ref 80.0–100.0)
Platelets: 407 10*3/uL — ABNORMAL HIGH (ref 150–400)
RBC: 4 MIL/uL — ABNORMAL LOW (ref 4.22–5.81)
RDW: 14.6 % (ref 11.5–15.5)
WBC: 9.6 10*3/uL (ref 4.0–10.5)
nRBC: 0 % (ref 0.0–0.2)

## 2019-07-29 LAB — MAGNESIUM: Magnesium: 2.1 mg/dL (ref 1.7–2.4)

## 2019-07-29 LAB — PHOSPHORUS: Phosphorus: 5.6 mg/dL — ABNORMAL HIGH (ref 2.5–4.6)

## 2019-07-29 MED ORDER — LORAZEPAM 1 MG PO TABS
2.0000 mg | ORAL_TABLET | Freq: Three times a day (TID) | ORAL | Status: DC
  Administered 2019-07-29 – 2019-09-06 (×117): 2 mg
  Filled 2019-07-29 (×19): qty 2
  Filled 2019-07-29: qty 4
  Filled 2019-07-29 (×46): qty 2
  Filled 2019-07-29: qty 4
  Filled 2019-07-29 (×10): qty 2
  Filled 2019-07-29: qty 4
  Filled 2019-07-29 (×40): qty 2

## 2019-07-29 MED ORDER — BROMOCRIPTINE MESYLATE 2.5 MG PO TABS
2.5000 mg | ORAL_TABLET | Freq: Two times a day (BID) | ORAL | Status: DC
  Administered 2019-07-29 – 2019-07-30 (×4): 2.5 mg
  Filled 2019-07-29 (×6): qty 1

## 2019-07-29 MED ORDER — QUETIAPINE FUMARATE 25 MG PO TABS
150.0000 mg | ORAL_TABLET | Freq: Two times a day (BID) | ORAL | Status: DC
  Administered 2019-07-29 (×2): 150 mg
  Filled 2019-07-29 (×2): qty 2

## 2019-07-29 NOTE — Progress Notes (Signed)
Patient ID: Jay Cole, male   DOB: 12-Sep-1995, 24 y.o.   MRN: [id number] Currently patient is tachycardic at 130 blood pressure is 120/70 he is on the ventilator not exhibiting any sweating.  Patient has had neuro storms or diencephalic fits that have been problematic over these past weeks.  They perhaps are slightly less severe than they had been but this weekend he did have substantial difficulty with control.  He is on clonidine and propranolol and bromocriptine has been suggested in an effort to control the thermal instabilities.  We will add 2.5 mg twice daily of this.  I discussed with the nurse concerns about his ultimate potential for recovery and today I contacted Tamsen Meek his mother to discuss the fact that he appears to be heading for persistent vegetative state.  I suggested to her that we would like to obtain an MRI of the brain to see what damage there is in the region of diencephalon and perhaps brainstem that may be creating this and if some of this may suggest an irreversible situation.  He has been receiving aggressive in total care but given the persistence of the situation one must be concerned about the possibility of persistent vegetative state for this young individual.
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
## 2019-07-29 NOTE — Plan of Care (Signed)
  Problem: Clinical Measurements: Goal: Neurologic status will improve Outcome: Not Progressing

## 2019-07-30 ENCOUNTER — Inpatient Hospital Stay (HOSPITAL_COMMUNITY): Payer: Medicaid - Out of State

## 2019-07-30 LAB — CBC
HCT: 34.4 % — ABNORMAL LOW (ref 39.0–52.0)
Hemoglobin: 10.2 g/dL — ABNORMAL LOW (ref 13.0–17.0)
MCH: 27.3 pg (ref 26.0–34.0)
MCHC: 29.7 g/dL — ABNORMAL LOW (ref 30.0–36.0)
MCV: 92.2 fL (ref 80.0–100.0)
Platelets: 388 10*3/uL (ref 150–400)
RBC: 3.73 MIL/uL — ABNORMAL LOW (ref 4.22–5.81)
RDW: 14.4 % (ref 11.5–15.5)
WBC: 7 10*3/uL (ref 4.0–10.5)
nRBC: 0 % (ref 0.0–0.2)

## 2019-07-30 LAB — GLUCOSE, CAPILLARY
Glucose-Capillary: 106 mg/dL — ABNORMAL HIGH (ref 70–99)
Glucose-Capillary: 114 mg/dL — ABNORMAL HIGH (ref 70–99)
Glucose-Capillary: 118 mg/dL — ABNORMAL HIGH (ref 70–99)
Glucose-Capillary: 127 mg/dL — ABNORMAL HIGH (ref 70–99)
Glucose-Capillary: 128 mg/dL — ABNORMAL HIGH (ref 70–99)
Glucose-Capillary: 153 mg/dL — ABNORMAL HIGH (ref 70–99)

## 2019-07-30 LAB — BASIC METABOLIC PANEL
Anion gap: 13 (ref 5–15)
BUN: 21 mg/dL — ABNORMAL HIGH (ref 6–20)
CO2: 29 mmol/L (ref 22–32)
Calcium: 9 mg/dL (ref 8.9–10.3)
Chloride: 100 mmol/L (ref 98–111)
Creatinine, Ser: 0.37 mg/dL — ABNORMAL LOW (ref 0.61–1.24)
GFR calc Af Amer: >60 mL/min (ref >60)
GFR calc non Af Amer: >60 mL/min (ref >60)
Glucose, Bld: 118 mg/dL — ABNORMAL HIGH (ref 70–99)
Potassium: 4 mmol/L (ref 3.5–5.1)
Sodium: 142 mmol/L (ref 135–145)

## 2019-07-30 LAB — MAGNESIUM: Magnesium: 2 mg/dL (ref 1.7–2.4)

## 2019-07-30 LAB — PHOSPHORUS: Phosphorus: 4.7 mg/dL — ABNORMAL HIGH (ref 2.5–4.6)

## 2019-07-30 MED ORDER — QUETIAPINE FUMARATE 100 MG PO TABS
200.0000 mg | ORAL_TABLET | Freq: Two times a day (BID) | ORAL | Status: AC
  Administered 2019-07-30 – 2019-07-31 (×4): 200 mg
  Filled 2019-07-30 (×2): qty 1
  Filled 2019-07-30: qty 2
  Filled 2019-07-30: qty 1

## 2019-07-30 MED ORDER — FENTANYL CITRATE (PF) 100 MCG/2ML IJ SOLN: 50.0000 ug | INTRAMUSCULAR | Status: DC | PRN

## 2019-07-30 MED ORDER — GADOBUTROL 1 MMOL/ML IV SOLN
0.1000 mL | Freq: Once | INTRAVENOUS | Status: AC | PRN
  Administered 2019-07-30: 0.1 mL via INTRAVENOUS

## 2019-07-30 NOTE — Progress Notes (Signed)
Patient ID: Jay Cole, male   DOB: 10/11/1995, 24 y.o.   MRN: [id number] MR discussed with Dr. Ellene Route. He does not recommend draining the new SDHs. I met with Jay Cole's mother and Jay Cole from [place]. Jay Cole's father joined on the phone. I told them that Jay Cole is in a vegetative state at this point and may not ever improve. She wants to continue full aggressive support at this time. RN CM continues to work on [employer] placement in [place]. Appreciate ongoing Palliative help.  Georganna Skeans, MD, MPH, FACS Please use [email or web address] to contact on call provider
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
## 2019-07-30 NOTE — TOC Progression Note (Addendum)
Transition of Care [facility]) - Progression Note    Patient Details  Name: Jay Cole MRN: [id number] Date of Birth: 1996-01-03  Transition of Care [facility]) CM/SW Contact  Ella Bodo, RN Phone Number: 07/30/2019, 2:35 PM  Clinical Narrative:  Continued efforts in progress to locate LTAC in [place] for patient.   [place]:  [place], [place]:  2/9: I spoke with admissions coordinator; referral in review with liaison.  Left message for a return call regarding possible admission  [facility], [place], [place]:  2/9, Left message for admissions coordinator requesting update on previous referral sent 2/3.   [address] [place], [place], [place]:  2/9 Left message with admissions coordinator requesting update on previous referral sent 2/3.    [place]:  2/9 Left message for admissions coordinator requesting callback.  TOC will continue efforts and provide updates as available.   Expected Discharge Plan: Long Term Acute Care (LTAC) Barriers to Discharge: Continued Medical Work up, Vent Bed not available  Expected Discharge Plan and Services Expected Discharge Plan: Long Term Acute Care (LTAC)   Discharge Planning Services: CM Consult Post Acute Care Choice: Long Term Acute Care (LTAC) Living arrangements for the past 2 months: Single Family Home                                       Social Determinants of Health (SDOH) Interventions    Readmission Risk Interventions No flowsheet data found.  Reinaldo Raddle, RN, BSN  Trauma/Neuro ICU Case Manager [phone]

## 2019-07-30 NOTE — Progress Notes (Signed)
Patient ID: Jay Cole, male   DOB: June 17, 1996, 24 y.o.   MRN: [id number] Mri reviewed.Though study is limited in scope the findings suggest a profound brain injury with substantial atrophic changes in both hemispheres and surrounding diencephalic structures.The subdural hygromas represent ex vacuo changes in addition. Overall intracranial pressure is low and drainage of the hygromas is not going to change his clinical status.   I called the patients mother and father and spoke with them for about 20 minutes regarding these findings and what I believe is ultimately a poor prognosis for any meaningful recovery for [name]. Mother had noted that when she talks to him he will open his eyes with some regularity, and I cautioned that this may be as good as it gets... I dont believe that she is ready to withdraw or change aggressive care status yet, but I did attempt to give her the best information I could about what I believe is an extremely poor prognosis.

## 2019-07-30 NOTE — Progress Notes (Signed)
Patient ID: [name], male   DOB: 01/24/1996, 24 y.o.   MRN: [id number]   Intermittent social vistis during this hospitalization creating space and opportunity for patient's mother to explore her thoughts and feelings regarding her son's current medical situation and attempting to build a trusting relationship.  Today  this NP visited patient at the bedside to speak with support Mom receiving new information regarding her son's care and recent MRI.  Dr Grandville Silos to bedside to update Mom/Dad is on phone to also receive information.   Again I gently offered information/education on the seriousness of his brain injury and the poor prognosis  for  meaningful recovery.  We discussed that unfortunately medical interventions are limited at times,  to prolong quality.  Explored with mother and father if they  have any thoughts or  sense of knowing that the current aggressive medical interventions may not be in his best interest.  Mother tells me she "does not think about those things" but dad speaks to "we need to think about what we would want if we were him".  Conversation had regarding the courage and selflessness it takes to release a loved one and face mortality, especially when it is one's child and  young.  Emotional support offered      Chaplain  services are involved  Questions and concerns addressed   Discussed with bedside RN  Palliative medicine team will continue to support holistically  Total time spent on the unit was 35 minutes  Greater than 50% of the time was spent in counseling and coordination of care  Wadie Lessen NP  Palliative Medicine Team Team Phone # [phone] Pager [phone]
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
## 2019-07-30 NOTE — Progress Notes (Signed)
RT transported pt to and from MRI without event. 

## 2019-07-31 LAB — GLUCOSE, CAPILLARY
Glucose-Capillary: 111 mg/dL — ABNORMAL HIGH (ref 70–99)
Glucose-Capillary: 133 mg/dL — ABNORMAL HIGH (ref 70–99)
Glucose-Capillary: 128 mg/dL — ABNORMAL HIGH (ref 70–99)
Glucose-Capillary: 121 mg/dL — ABNORMAL HIGH (ref 70–99)
Glucose-Capillary: 129 mg/dL — ABNORMAL HIGH (ref 70–99)
Glucose-Capillary: 141 mg/dL — ABNORMAL HIGH (ref 70–99)

## 2019-07-31 LAB — BASIC METABOLIC PANEL
Anion gap: 12 (ref 5–15)
BUN: 19 mg/dL (ref 6–20)
CO2: 28 mmol/L (ref 22–32)
Calcium: 8.4 mg/dL — ABNORMAL LOW (ref 8.9–10.3)
Chloride: 101 mmol/L (ref 98–111)
Creatinine, Ser: 0.35 mg/dL — ABNORMAL LOW (ref 0.61–1.24)
GFR calc Af Amer: >60 mL/min (ref >60)
GFR calc non Af Amer: >60 mL/min (ref >60)
Glucose, Bld: 95 mg/dL (ref 70–99)
Potassium: 3.3 mmol/L — ABNORMAL LOW (ref 3.5–5.1)
Sodium: 141 mmol/L (ref 135–145)

## 2019-07-31 LAB — MAGNESIUM: Magnesium: 1.8 mg/dL (ref 1.7–2.4)

## 2019-07-31 LAB — CBC
HCT: 34.4 % — ABNORMAL LOW (ref 39.0–52.0)
Hemoglobin: 10.1 g/dL — ABNORMAL LOW (ref 13.0–17.0)
MCH: 27.9 pg (ref 26.0–34.0)
MCHC: 29.4 g/dL — ABNORMAL LOW (ref 30.0–36.0)
MCV: 95 fL (ref 80.0–100.0)
Platelets: 372 10*3/uL (ref 150–400)
RBC: 3.62 MIL/uL — ABNORMAL LOW (ref 4.22–5.81)
RDW: 14.4 % (ref 11.5–15.5)
WBC: 8.2 10*3/uL (ref 4.0–10.5)
nRBC: 0 % (ref 0.0–0.2)

## 2019-07-31 LAB — PHOSPHORUS: Phosphorus: 4.6 mg/dL (ref 2.5–4.6)

## 2019-07-31 MED ORDER — BROMOCRIPTINE MESYLATE 2.5 MG PO TABS
5.0000 mg | ORAL_TABLET | Freq: Two times a day (BID) | ORAL | Status: DC
  Administered 2019-07-31 – 2019-09-04 (×71): 5 mg
  Filled 2019-07-31 (×73): qty 2

## 2019-07-31 MED ORDER — TAMSULOSIN HCL 0.4 MG PO CAPS
0.4000 mg | ORAL_CAPSULE | Freq: Every day | ORAL | Status: DC
  Administered 2019-07-31 – 2019-08-15 (×12): 0.4 mg via ORAL
  Filled 2019-07-31 (×16): qty 1

## 2019-07-31 MED ORDER — QUETIAPINE FUMARATE 200 MG PO TABS
400.0000 mg | ORAL_TABLET | Freq: Two times a day (BID) | ORAL | Status: DC
  Filled 2019-07-31: qty 2

## 2019-07-31 MED ORDER — MIDAZOLAM HCL 2 MG/2ML IJ SOLN
1.0000 mg | INTRAMUSCULAR | Status: DC | PRN
  Administered 2019-08-02 – 2019-08-11 (×41): 2 mg via INTRAVENOUS
  Filled 2019-07-31 (×41): qty 2

## 2019-07-31 NOTE — Progress Notes (Signed)
PT Cancellation Note  Patient Details Name: Jay Cole MRN: [id number] DOB: 02-26-1996   Cancelled Treatment:    Reason Eval/Treat Not Completed: Other (comment)   All interventions elicit neuro storming.  Will sign off at this time.  If at some point his storming ceases and he is appropriate for therapies, please reorder.  Thanks. 07/31/2019  Ginger Carne., PT Acute Rehabilitation Services [phone]  (pager) [phone]  (office)   Tessie Fass [place] 07/31/2019, 9:54 AM

## 2019-07-31 NOTE — Progress Notes (Signed)
Dr. Bobbye Morton stated to allow him to wean. If his HR is > 160 and/or RR is  > 40 for over 5 minutes, then he may need to be flip back to full support.
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
## 2019-07-31 NOTE — Ethics Note (Signed)
   This NP Wadie Lessen NP serving on the [employer], and carrying the pager this month responded to a page from Dr Bobbye Morton regarding patient Jay Cole.  In exploring Dr Craig Guess question and request of the Ethics Committee she  expressed her concern for moral distress for nursing in caring for this patient as  her main concern   I was able to speak in detail with Dr Bobbye Morton regarding this patient because he is being followed by the Palliative Medicne Team and I am the primary provider providing services for [name] and his family.  I have meet with his mother multiple times.  Dr Bobbye Morton and I discussed the difficult situation and we did discuss the [place], I provided a hard copy to Dr Bobbye Morton.  Discussed the situation with Albin Felling, Director of Spiritual Care exploring  possible support outlets for any employee dealing with stress/distress of any kind.  Employee Assistance Program is available to all employees.   No need for Ethics involvement at this time.  PMT will continue to support holistically   Wadie Lessen NP # [phone]   No charge

## 2019-07-31 NOTE — Progress Notes (Signed)
Patient ID: Jay Cole, male   DOB: 12-21-1995, 24 y.o.   MRN: [id number] Agree with attempts to wean aggressively despite neuro storming.  We will continue to follow at a distance and offer support as needed and appropriate.  Prognosis remains grim.

## 2019-08-01 LAB — BASIC METABOLIC PANEL
Anion gap: 11 (ref 5–15)
BUN: 17 mg/dL (ref 6–20)
CO2: 28 mmol/L (ref 22–32)
Calcium: 8.6 mg/dL — ABNORMAL LOW (ref 8.9–10.3)
Chloride: 101 mmol/L (ref 98–111)
Creatinine, Ser: 0.43 mg/dL — ABNORMAL LOW (ref 0.61–1.24)
GFR calc Af Amer: >60 mL/min (ref >60)
GFR calc non Af Amer: >60 mL/min (ref >60)
Glucose, Bld: 148 mg/dL — ABNORMAL HIGH (ref 70–99)
Potassium: 3.6 mmol/L (ref 3.5–5.1)
Sodium: 140 mmol/L (ref 135–145)

## 2019-08-01 LAB — GLUCOSE, CAPILLARY
Glucose-Capillary: 153 mg/dL — ABNORMAL HIGH (ref 70–99)
Glucose-Capillary: 140 mg/dL — ABNORMAL HIGH (ref 70–99)
Glucose-Capillary: 137 mg/dL — ABNORMAL HIGH (ref 70–99)
Glucose-Capillary: 135 mg/dL — ABNORMAL HIGH (ref 70–99)
Glucose-Capillary: 142 mg/dL — ABNORMAL HIGH (ref 70–99)
Glucose-Capillary: 138 mg/dL — ABNORMAL HIGH (ref 70–99)

## 2019-08-01 LAB — CBC
HCT: 34.7 % — ABNORMAL LOW (ref 39.0–52.0)
Hemoglobin: 10.6 g/dL — ABNORMAL LOW (ref 13.0–17.0)
MCH: 27.7 pg (ref 26.0–34.0)
MCHC: 30.5 g/dL (ref 30.0–36.0)
MCV: 90.8 fL (ref 80.0–100.0)
Platelets: 393 10*3/uL (ref 150–400)
RBC: 3.82 MIL/uL — ABNORMAL LOW (ref 4.22–5.81)
RDW: 14.2 % (ref 11.5–15.5)
WBC: 8.7 10*3/uL (ref 4.0–10.5)
nRBC: 0 % (ref 0.0–0.2)

## 2019-08-01 LAB — MAGNESIUM: Magnesium: 2 mg/dL (ref 1.7–2.4)

## 2019-08-01 LAB — PHOSPHORUS: Phosphorus: 4.6 mg/dL (ref 2.5–4.6)

## 2019-08-01 MED ORDER — QUETIAPINE FUMARATE 200 MG PO TABS
400.0000 mg | ORAL_TABLET | Freq: Two times a day (BID) | ORAL | Status: DC
  Administered 2019-08-01 – 2019-08-07 (×14): 400 mg
  Filled 2019-08-01 (×13): qty 2

## 2019-08-01 MED ORDER — QUETIAPINE FUMARATE 200 MG PO TABS: 400.0000 mg | ORAL_TABLET | Freq: Two times a day (BID) | ORAL | Status: DC

## 2019-08-01 MED ORDER — BETHANECHOL CHLORIDE 10 MG PO TABS
25.0000 mg | ORAL_TABLET | Freq: Three times a day (TID) | ORAL | Status: DC
  Administered 2019-08-01 – 2019-09-04 (×105): 25 mg
  Filled 2019-08-01 (×8): qty 3
  Filled 2019-08-01: qty 2.5
  Filled 2019-08-01 (×19): qty 3
  Filled 2019-08-01: qty 2.5
  Filled 2019-08-01 (×11): qty 3
  Filled 2019-08-01: qty 2.5
  Filled 2019-08-01 (×18): qty 3
  Filled 2019-08-01: qty 2.5
  Filled 2019-08-01 (×37): qty 3
  Filled 2019-08-01: qty 2.5
  Filled 2019-08-01 (×4): qty 3
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
## 2019-08-01 NOTE — Progress Notes (Signed)
Patient ID: Jay Cole, male   DOB: 01-13-1996, 24 y.o.   MRN: [id number] Patient's vital signs have been stable and somewhat normalized since his last severe neuro storm.  Today his flap is sunken in the left frontal region which is a first that I have observed.  This suggest that some of the subdural hygromas may be resorbing themselves.  Nonetheless his clinical status remains unchanged with a easy extensor posturing with minimal stimulation.  His pupils are 3 mm and equally reactive.  His eye movements do not rove. At this time his prognosis can remains poor for any meaningful recovery nonetheless and supportive measures are continuing to be employed.  I will be out of the office during the next week's time and Dr. Sherwood Gambler will be available for consultation should any neurosurgical questions arise during the daytime.  Otherwise the on-call neurosurgeon can respond to any situations that may arise but I do not anticipate the need for any specific intervention other than further prognostication.
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
## 2019-08-02 LAB — CBC
HCT: 35.6 % — ABNORMAL LOW (ref 39.0–52.0)
Hemoglobin: 10.7 g/dL — ABNORMAL LOW (ref 13.0–17.0)
MCH: 27.7 pg (ref 26.0–34.0)
MCHC: 30.1 g/dL (ref 30.0–36.0)
MCV: 92.2 fL (ref 80.0–100.0)
Platelets: 393 10*3/uL (ref 150–400)
RBC: 3.86 MIL/uL — ABNORMAL LOW (ref 4.22–5.81)
RDW: 14.4 % (ref 11.5–15.5)
WBC: 9.2 10*3/uL (ref 4.0–10.5)
nRBC: 0 % (ref 0.0–0.2)

## 2019-08-02 LAB — GLUCOSE, CAPILLARY
Glucose-Capillary: 137 mg/dL — ABNORMAL HIGH (ref 70–99)
Glucose-Capillary: 90 mg/dL (ref 70–99)
Glucose-Capillary: 126 mg/dL — ABNORMAL HIGH (ref 70–99)
Glucose-Capillary: 115 mg/dL — ABNORMAL HIGH (ref 70–99)
Glucose-Capillary: 100 mg/dL — ABNORMAL HIGH (ref 70–99)
Glucose-Capillary: 149 mg/dL — ABNORMAL HIGH (ref 70–99)

## 2019-08-02 LAB — BASIC METABOLIC PANEL
Anion gap: 9 (ref 5–15)
BUN: 16 mg/dL (ref 6–20)
CO2: 26 mmol/L (ref 22–32)
Calcium: 8.5 mg/dL — ABNORMAL LOW (ref 8.9–10.3)
Chloride: 104 mmol/L (ref 98–111)
Creatinine, Ser: 0.44 mg/dL — ABNORMAL LOW (ref 0.61–1.24)
GFR calc Af Amer: >60 mL/min (ref >60)
GFR calc non Af Amer: >60 mL/min (ref >60)
Glucose, Bld: 133 mg/dL — ABNORMAL HIGH (ref 70–99)
Potassium: 4 mmol/L (ref 3.5–5.1)
Sodium: 139 mmol/L (ref 135–145)

## 2019-08-02 LAB — MAGNESIUM: Magnesium: 1.9 mg/dL (ref 1.7–2.4)

## 2019-08-02 LAB — PHOSPHORUS: Phosphorus: 5 mg/dL — ABNORMAL HIGH (ref 2.5–4.6)
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
## 2019-08-02 NOTE — TOC Progression Note (Signed)
Transition of Care [facility]) - Progression Note    Patient Details  Name: Jay Cole MRN: [id number] Date of Birth: 1995/08/09  [facility] of [facility]) CM/SW Contact  Ella Bodo, RN Phone Number: 08/02/2019, 3:41 PM  Clinical Narrative:  Follow up on referrals for LTAC are as follows: [facility]: LTAC does not accept Medicaid of [place]: Pt declined [employer] Transitional:  Continue to leave messages for update, with no call backs  I spoke with Chante with [facility] in [place] today:  They are interested in patient, and will be completing referral and initiating insurance authorization with Medicaid of [place].  Faxed updated clinical information, per admission liaison's request.  They could have an answer as soon as Wednesday of next week.  I have updated pt's mother,Cindy, and she is pleased with this news.  Will follow with updates as they are available.       Expected Discharge Plan: Long Term Acute Care (LTAC) Barriers to Discharge: Continued Medical Work up, Vent Bed not available  Expected Discharge Plan and Services Expected Discharge Plan: Long Term Acute Care (LTAC)   Discharge Planning Services: CM Consult Post Acute Care Choice: Long Term Acute Care (LTAC) Living arrangements for the past 2 months: Single Family Home                                       Social Determinants of Health (SDOH) Interventions    Readmission Risk Interventions No flowsheet data found.   Reinaldo Raddle, RN, BSN  Trauma/Neuro ICU Case Manager [phone]

## 2019-08-03 LAB — BASIC METABOLIC PANEL
Anion gap: 9 (ref 5–15)
BUN: 22 mg/dL — ABNORMAL HIGH (ref 6–20)
CO2: 29 mmol/L (ref 22–32)
Calcium: 9 mg/dL (ref 8.9–10.3)
Chloride: 103 mmol/L (ref 98–111)
Creatinine, Ser: 0.42 mg/dL — ABNORMAL LOW (ref 0.61–1.24)
GFR calc Af Amer: >60 mL/min (ref >60)
GFR calc non Af Amer: >60 mL/min (ref >60)
Glucose, Bld: 76 mg/dL (ref 70–99)
Potassium: 4.1 mmol/L (ref 3.5–5.1)
Sodium: 141 mmol/L (ref 135–145)

## 2019-08-03 LAB — MAGNESIUM: Magnesium: 1.8 mg/dL (ref 1.7–2.4)

## 2019-08-03 LAB — CBC
HCT: 37.4 % — ABNORMAL LOW (ref 39.0–52.0)
Hemoglobin: 11.4 g/dL — ABNORMAL LOW (ref 13.0–17.0)
MCH: 27.7 pg (ref 26.0–34.0)
MCHC: 30.5 g/dL (ref 30.0–36.0)
MCV: 91 fL (ref 80.0–100.0)
Platelets: 444 10*3/uL — ABNORMAL HIGH (ref 150–400)
RBC: 4.11 MIL/uL — ABNORMAL LOW (ref 4.22–5.81)
RDW: 14.1 % (ref 11.5–15.5)
WBC: 10.2 10*3/uL (ref 4.0–10.5)
nRBC: 0 % (ref 0.0–0.2)

## 2019-08-03 LAB — GLUCOSE, CAPILLARY
Glucose-Capillary: 107 mg/dL — ABNORMAL HIGH (ref 70–99)
Glucose-Capillary: 108 mg/dL — ABNORMAL HIGH (ref 70–99)
Glucose-Capillary: 173 mg/dL — ABNORMAL HIGH (ref 70–99)
Glucose-Capillary: 128 mg/dL — ABNORMAL HIGH (ref 70–99)
Glucose-Capillary: 104 mg/dL — ABNORMAL HIGH (ref 70–99)

## 2019-08-03 LAB — PHOSPHORUS: Phosphorus: 5.1 mg/dL — ABNORMAL HIGH (ref 2.5–4.6)
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
## 2019-08-03 NOTE — Progress Notes (Addendum)
Patient began to exhibit neuro storm symptoms at 0800 after failed wean attempt. Symptoms of inc. HR, inc RR, inc Temp (105.1) and profuse diuresis lasted 8 hours out of my 12 hour shift. See MAR for medications given. Cooling blanket and six ice packs applied to patient.   Current vital signs.  Blood pressure (!) 155/139, pulse (!) 171, temperature (!) 103.1 F (39.5 C), temperature source Axillary, resp. rate (!) 33, height 5\' 7"  (1.702 m), weight 113 kg, SpO2 100 %.

## 2019-08-04 ENCOUNTER — Inpatient Hospital Stay (HOSPITAL_COMMUNITY): Payer: Medicaid - Out of State

## 2019-08-04 LAB — CBC
HCT: 39.5 % (ref 39.0–52.0)
Hemoglobin: 11.7 g/dL — ABNORMAL LOW (ref 13.0–17.0)
MCH: 27.6 pg (ref 26.0–34.0)
MCHC: 29.6 g/dL — ABNORMAL LOW (ref 30.0–36.0)
MCV: 93.2 fL (ref 80.0–100.0)
Platelets: 252 10*3/uL (ref 150–400)
RBC: 4.24 MIL/uL (ref 4.22–5.81)
RDW: 14.7 % (ref 11.5–15.5)
WBC: 17.3 10*3/uL — ABNORMAL HIGH (ref 4.0–10.5)
nRBC: 0.2 % (ref 0.0–0.2)

## 2019-08-04 LAB — GLUCOSE, CAPILLARY
Glucose-Capillary: 103 mg/dL — ABNORMAL HIGH (ref 70–99)
Glucose-Capillary: 112 mg/dL — ABNORMAL HIGH (ref 70–99)
Glucose-Capillary: 114 mg/dL — ABNORMAL HIGH (ref 70–99)
Glucose-Capillary: 123 mg/dL — ABNORMAL HIGH (ref 70–99)
Glucose-Capillary: 125 mg/dL — ABNORMAL HIGH (ref 70–99)
Glucose-Capillary: 125 mg/dL — ABNORMAL HIGH (ref 70–99)
Glucose-Capillary: 164 mg/dL — ABNORMAL HIGH (ref 70–99)

## 2019-08-04 LAB — C DIFFICILE QUICK SCREEN W PCR REFLEX
C Diff antigen: NEGATIVE
C Diff interpretation: NOT DETECTED
C Diff toxin: NEGATIVE

## 2019-08-04 LAB — URINALYSIS, ROUTINE W REFLEX MICROSCOPIC
Bilirubin Urine: NEGATIVE
Glucose, UA: NEGATIVE mg/dL
Ketones, ur: NEGATIVE mg/dL
Nitrite: NEGATIVE
Protein, ur: NEGATIVE mg/dL
RBC / HPF: >50 RBC/hpf — ABNORMAL HIGH (ref 0–5)
Specific Gravity, Urine: 1.02 (ref 1.005–1.030)
pH: 5 (ref 5.0–8.0)

## 2019-08-04 LAB — BASIC METABOLIC PANEL
Anion gap: 12 (ref 5–15)
BUN: 36 mg/dL — ABNORMAL HIGH (ref 6–20)
CO2: 22 mmol/L (ref 22–32)
Calcium: 8.3 mg/dL — ABNORMAL LOW (ref 8.9–10.3)
Chloride: 110 mmol/L (ref 98–111)
Creatinine, Ser: 0.52 mg/dL — ABNORMAL LOW (ref 0.61–1.24)
GFR calc Af Amer: >60 mL/min (ref >60)
GFR calc non Af Amer: >60 mL/min (ref >60)
Glucose, Bld: 127 mg/dL — ABNORMAL HIGH (ref 70–99)
Potassium: 3 mmol/L — ABNORMAL LOW (ref 3.5–5.1)
Sodium: 144 mmol/L (ref 135–145)

## 2019-08-04 MED ORDER — LACTATED RINGERS IV BOLUS
1000.0000 mL | Freq: Once | INTRAVENOUS | Status: AC
  Administered 2019-08-04: 1000 mL via INTRAVENOUS

## 2019-08-04 MED ORDER — POTASSIUM CHLORIDE 20 MEQ/15ML (10%) PO SOLN
40.0000 meq | ORAL | Status: AC
  Administered 2019-08-04 (×3): 40 meq

## 2019-08-04 MED ORDER — SODIUM CHLORIDE 0.9 % IV SOLN
2.0000 g | Freq: Three times a day (TID) | INTRAVENOUS | Status: DC
  Administered 2019-08-04 – 2019-08-06 (×6): 2 g via INTRAVENOUS
  Filled 2019-08-04 (×8): qty 2

## 2019-08-04 MED ORDER — POTASSIUM CHLORIDE 20 MEQ/15ML (10%) PO SOLN
40.0000 meq | ORAL | Status: DC
  Filled 2019-08-04: qty 30

## 2019-08-04 NOTE — Progress Notes (Signed)
RT note- Called to patient room for low VE and RR. Patient is on 12 PS, VT's 958ml's+, decreased PS to 5 and patient is tolerating well with RR of 12, VT's 681ml. Dr. Bobbye Morton called to place patient on ATC, tolerating at this time, HR 85-90, RR 10-12bpm, sp02 98%. Continue to monitor.
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
## 2019-08-04 NOTE — Progress Notes (Signed)
Patient placed back on ventilator due to sustained elevated HR and RR.  Tolerating well at this time.  Will continue to monitor.

## 2019-08-04 NOTE — Progress Notes (Addendum)
Patient able to tolerate trach collar for 5 hours today.  Patient began to exhibit neuro-storm symptoms after 3.5 hours but was able to maintain oxygen saturations for the last 1.5 hours on TC.Patient placed back on ventilator for tachypnea only.    Patient currently neuro storming with progressive increase in HR, RR, Fever and BP over the last 4.5 hours. See MAR for all medications given.  Vitals:   08/04/19 1600 08/04/19 1701 08/04/19 1726 08/04/19 1800  BP: (!) 100/48 97/83 (!) 158/139 (!) 180/157  Pulse: (!) 110   (!) 158  Resp: (!) 22   (!) 43  Temp: (!) 100.9 F (38.3 C) 100 F (37.8 C)  (!) 102.9 F (39.4 C)  TempSrc: Axillary     SpO2: 95%   99%  Weight:      Height:

## 2019-08-04 NOTE — Progress Notes (Signed)
Pharmacy Antibiotic Note  Jay Cole is a 24 y.o. male admitted on 07/13/2019 with pneumonia.  Pharmacy has been consulted for Cefepime dosing.   Patient febrile - Tmax 105, leukocytosis WBC 17.3, new infiltrate on CXR  Plan: Start Cefepime 2 grams IV q8hours Monitor renal function, clinical status and C&S.  Height: 5\' 7"  (170.2 cm) Weight: 248 lb 10.9 oz (112.8 kg) IBW/kg (Calculated) : 66.1  Temp (24hrs), Avg:100.8 F (38.2 C), Min:97.4 F (36.3 C), Max:105 F (40.6 C)  Recent Labs  Lab 07/31/19 0454 08/01/19 0522 08/02/19 0534 08/03/19 0426 08/04/19 0421  WBC 8.2 8.7 9.2 10.2 17.3*  CREATININE 0.35* 0.43* 0.44* 0.42* 0.52*    Estimated Creatinine Clearance: 175.2 mL/min (A) (by C-G formula based on SCr of 0.52 mg/dL (L)).    Allergies  Allergen Reactions  . Penicillins Anaphylaxis    Tolerates cephalosporins  Did it involve swelling of the face/tongue/throat, SOB, or low BP? Yes Did it involve sudden or severe rash/hives, skin peeling, or any reaction on the inside of your mouth or nose? No Did you need to seek medical attention at a hospital or doctor's office? Yes When did it last happen?4 or 5 years ago If all above answers are "NO", may proceed with cephalosporin use.   . Codeine Other (See Comments)    Heart palpitations  . Tramadol Hives and Nausea And Vomiting    Antimicrobials this admission: 1/12 levaquin >>1/13 1/13 cefazolin >> 1/15 1/15 Vanc >>1/18; 2/2>>2/3 1/15 Merrem >> 1/21 2/2 cefepime>>2/3; 2/14 >> 2/3 ceftriaxone>> 2/10  Thank you for allowing pharmacy to be a part of this patient's care.  Alanda Slim, PharmD, [facility] Clinical Pharmacist Please see AMION for all Pharmacists' Contact Phone Numbers 08/04/2019, 1:58 PM

## 2019-08-05 LAB — GLUCOSE, CAPILLARY
Glucose-Capillary: 98 mg/dL (ref 70–99)
Glucose-Capillary: 107 mg/dL — ABNORMAL HIGH (ref 70–99)
Glucose-Capillary: 99 mg/dL (ref 70–99)
Glucose-Capillary: 105 mg/dL — ABNORMAL HIGH (ref 70–99)
Glucose-Capillary: 111 mg/dL — ABNORMAL HIGH (ref 70–99)
Glucose-Capillary: 119 mg/dL — ABNORMAL HIGH (ref 70–99)

## 2019-08-05 LAB — CBC
HCT: 33 % — ABNORMAL LOW (ref 39.0–52.0)
Hemoglobin: 9.9 g/dL — ABNORMAL LOW (ref 13.0–17.0)
MCH: 27.9 pg (ref 26.0–34.0)
MCHC: 30 g/dL (ref 30.0–36.0)
MCV: 93 fL (ref 80.0–100.0)
Platelets: 142 10*3/uL — ABNORMAL LOW (ref 150–400)
RBC: 3.55 MIL/uL — ABNORMAL LOW (ref 4.22–5.81)
RDW: 14.8 % (ref 11.5–15.5)
WBC: 13.2 10*3/uL — ABNORMAL HIGH (ref 4.0–10.5)
nRBC: 0 % (ref 0.0–0.2)

## 2019-08-05 LAB — BASIC METABOLIC PANEL
Anion gap: 9 (ref 5–15)
BUN: 26 mg/dL — ABNORMAL HIGH (ref 6–20)
CO2: 22 mmol/L (ref 22–32)
Calcium: 7.6 mg/dL — ABNORMAL LOW (ref 8.9–10.3)
Chloride: 113 mmol/L — ABNORMAL HIGH (ref 98–111)
Creatinine, Ser: 0.37 mg/dL — ABNORMAL LOW (ref 0.61–1.24)
GFR calc Af Amer: >60 mL/min (ref >60)
GFR calc non Af Amer: >60 mL/min (ref >60)
Glucose, Bld: 160 mg/dL — ABNORMAL HIGH (ref 70–99)
Potassium: 3.3 mmol/L — ABNORMAL LOW (ref 3.5–5.1)
Sodium: 144 mmol/L (ref 135–145)

## 2019-08-05 LAB — MAGNESIUM: Magnesium: 1.9 mg/dL (ref 1.7–2.4)

## 2019-08-05 LAB — PHOSPHORUS: Phosphorus: 2.1 mg/dL — ABNORMAL LOW (ref 2.5–4.6)

## 2019-08-05 MED ORDER — POTASSIUM PHOSPHATES 15 MMOLE/5ML IV SOLN
20.0000 mmol | Freq: Once | INTRAVENOUS | Status: AC
  Administered 2019-08-05: 20 mmol via INTRAVENOUS
  Filled 2019-08-05: qty 6.67

## 2019-08-05 MED ORDER — OXYCODONE HCL 5 MG/5ML PO SOLN
10.0000 mg | ORAL | Status: DC | PRN
  Administered 2019-08-05: 15 mg
  Administered 2019-08-05: 10 mg
  Administered 2019-08-05 – 2019-08-08 (×9): 15 mg
  Administered 2019-08-08: 10 mg
  Administered 2019-08-09 – 2019-08-13 (×14): 15 mg
  Administered 2019-08-14: 10 mg
  Administered 2019-08-14 – 2019-08-25 (×24): 15 mg
  Administered 2019-08-28: 10 mg
  Administered 2019-08-29 – 2019-09-07 (×15): 15 mg
  Filled 2019-08-05 (×18): qty 15
  Filled 2019-08-05: qty 10
  Filled 2019-08-05 (×18): qty 15
  Filled 2019-08-05: qty 10
  Filled 2019-08-05 (×12): qty 15
  Filled 2019-08-05: qty 10
  Filled 2019-08-05 (×8): qty 15
  Filled 2019-08-05: qty 10
  Filled 2019-08-05 (×10): qty 15

## 2019-08-05 MED ORDER — FENTANYL CITRATE (PF) 100 MCG/2ML IJ SOLN
100.0000 ug | INTRAMUSCULAR | Status: DC | PRN
  Administered 2019-08-05 – 2019-09-11 (×4): 100 ug via INTRAVENOUS
  Filled 2019-08-05 (×4): qty 2

## 2019-08-05 NOTE — Progress Notes (Signed)
Patient ID: Jay Cole, male   DOB: Mar 11, 1996, 24 y.o.   MRN: [id number] I called his mother and updated her and answered her questions.  Georganna Skeans, MD, MPH, FACS Please use [email or web address] to contact on call provider

## 2019-08-05 NOTE — Progress Notes (Signed)
Spoke with Chante at [facility]. I gave her his recent vitals and vent settings, confirmed that he was on TC trial currently.I also confirmed that he was taking Maxipime.

## 2019-08-05 NOTE — Progress Notes (Signed)
Patient placed back to full support vent settings due to drop in 02 Sats and extreme agitation. RN aware. RT will continue to monitor.

## 2019-08-05 NOTE — TOC Progression Note (Signed)
Transition of Care [facility]) - Progression Note    Patient Details  Name: JABE WARF MRN: [id number] Date of Birth: 29-May-1996  [facility] of [facility]) CM/SW Contact  Oren Section Cleta Alberts, RN Phone Number: 08/05/2019, 4:53 PM  Clinical Narrative:  Gwyndolyn Saxon in [facility] admissions coordinator has initiated insurance authorization today for potential admission.  Faxed all updated clinical information as requested to [facility], Chante.  Will provide updates as they become available.       Expected Discharge Plan: Long Term Acute Care (LTAC) Barriers to Discharge: Continued Medical Work up, Vent Bed not available  Expected Discharge Plan and Services Expected Discharge Plan: Long Term Acute Care (LTAC)   Discharge Planning Services: CM Consult Post Acute Care Choice: Long Term Acute Care (LTAC) Living arrangements for the past 2 months: Single Family Home                                       Social Determinants of Health (SDOH) Interventions    Readmission Risk Interventions No flowsheet data found.  Reinaldo Raddle, RN, BSN  Trauma/Neuro ICU Case Manager [phone]

## 2019-08-05 NOTE — Progress Notes (Signed)
Subjective: Patient currently on trach collar, which he is being placed on for several hours each day, and otherwise is being supported on a ventilator via tracheostomy.  G-tube in place.  Wrist/hand splints being used every other 4 hours.  Objective: Vital signs in last 24 hours: Vitals:   08/05/19 0700 08/05/19 0800 08/05/19 0806 08/05/19 0830  BP: (!) 94/56 96/64 96/64  (!) 107/57  Pulse: 75 72 73 74  Resp: 11 17 13 11   Temp:      TempSrc:      SpO2: 97% 99% 99% 100%  Weight:      Height:        Intake/Output from previous day: 02/14 0701 - 02/15 0700 In: 3633.5 [I.V.:1126.2; NG/GT:1465; IV Piggyback:742.3] Out: 1875 [Urine:775; Drains:400; Stool:700] Intake/Output this shift: No intake/output data recorded.  Physical Exam: Eyes open slightly, not opening further to stimulation.  Pupils 6 mm, round, reactive to light.  Spontaneously decerebrating bilaterally, right greater than left.  Scalp full, but soft and pulsating, at craniectomy site.  CBC Recent Labs    08/04/19 0421 08/05/19 0459  WBC 17.3* 13.2*  HGB 11.7* 9.9*  HCT 39.5 33.0*  PLT 252 142*   BMET Recent Labs    08/04/19 0421 08/05/19 0459  NA 144 144  K 3.0* 3.3*  CL 110 113*  CO2 22 22  GLUCOSE 127* 160*  BUN 36* 26*  CREATININE 0.52* 0.37*  CALCIUM 8.3* 7.6*     Assessment/Plan: Neurologically unchanged as compared to recent exams by Dr. Ellene Route.  Continuing supportive care.   Hosie Spangle, MD 08/05/2019, 11:48 AM
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
## 2019-08-06 LAB — PHOSPHORUS: Phosphorus: 3.5 mg/dL (ref 2.5–4.6)

## 2019-08-06 LAB — MAGNESIUM: Magnesium: 1.8 mg/dL (ref 1.7–2.4)

## 2019-08-06 LAB — CULTURE, RESPIRATORY W GRAM STAIN

## 2019-08-06 LAB — CBC
HCT: 33.5 % — ABNORMAL LOW (ref 39.0–52.0)
Hemoglobin: 10 g/dL — ABNORMAL LOW (ref 13.0–17.0)
MCH: 27.6 pg (ref 26.0–34.0)
MCHC: 29.9 g/dL — ABNORMAL LOW (ref 30.0–36.0)
MCV: 92.5 fL (ref 80.0–100.0)
Platelets: 178 10*3/uL (ref 150–400)
RBC: 3.62 MIL/uL — ABNORMAL LOW (ref 4.22–5.81)
RDW: 15.1 % (ref 11.5–15.5)
WBC: 7.9 10*3/uL (ref 4.0–10.5)
nRBC: 0 % (ref 0.0–0.2)

## 2019-08-06 LAB — GLUCOSE, CAPILLARY
Glucose-Capillary: 128 mg/dL — ABNORMAL HIGH (ref 70–99)
Glucose-Capillary: 107 mg/dL — ABNORMAL HIGH (ref 70–99)
Glucose-Capillary: 145 mg/dL — ABNORMAL HIGH (ref 70–99)
Glucose-Capillary: 143 mg/dL — ABNORMAL HIGH (ref 70–99)
Glucose-Capillary: 97 mg/dL (ref 70–99)
Glucose-Capillary: 118 mg/dL — ABNORMAL HIGH (ref 70–99)

## 2019-08-06 LAB — SARS CORONAVIRUS 2 (TAT 6-24 HRS): SARS Coronavirus 2: NEGATIVE

## 2019-08-06 LAB — BASIC METABOLIC PANEL
Anion gap: 7 (ref 5–15)
BUN: 31 mg/dL — ABNORMAL HIGH (ref 6–20)
CO2: 22 mmol/L (ref 22–32)
Calcium: 7.5 mg/dL — ABNORMAL LOW (ref 8.9–10.3)
Chloride: 116 mmol/L — ABNORMAL HIGH (ref 98–111)
Creatinine, Ser: 0.72 mg/dL (ref 0.61–1.24)
GFR calc Af Amer: >60 mL/min (ref >60)
GFR calc non Af Amer: >60 mL/min (ref >60)
Glucose, Bld: 136 mg/dL — ABNORMAL HIGH (ref 70–99)
Potassium: 3.1 mmol/L — ABNORMAL LOW (ref 3.5–5.1)
Sodium: 145 mmol/L (ref 135–145)

## 2019-08-06 MED ORDER — POTASSIUM CHLORIDE 20 MEQ/15ML (10%) PO SOLN
40.0000 meq | Freq: Once | ORAL | Status: AC
  Administered 2019-08-06: 09:00:00 40 meq
  Filled 2019-08-06: qty 30

## 2019-08-06 MED ORDER — POTASSIUM CHLORIDE 10 MEQ/50ML IV SOLN
10.0000 meq | INTRAVENOUS | Status: AC
  Administered 2019-08-06 (×3): 10 meq via INTRAVENOUS
  Filled 2019-08-06 (×3): qty 50

## 2019-08-06 MED ORDER — SULFAMETHOXAZOLE-TRIMETHOPRIM 200-40 MG/5ML PO SUSP
20.0000 mL | Freq: Two times a day (BID) | ORAL | Status: AC
  Administered 2019-08-06 – 2019-08-19 (×28): 20 mL
  Filled 2019-08-06 (×30): qty 20

## 2019-08-06 NOTE — Progress Notes (Signed)
Mother, [name], called tonight. I updated her at 2000 and again at 0330 this morning on patient's status and let her know patients hr and respiratory rate were imprinvg throughout the night.

## 2019-08-06 NOTE — Progress Notes (Signed)
Kinsinger notified of K 3.1. Order placed for 30 mg IV replete
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
## 2019-08-07 LAB — GLUCOSE, CAPILLARY
Glucose-Capillary: 68 mg/dL — ABNORMAL LOW (ref 70–99)
Glucose-Capillary: 70 mg/dL (ref 70–99)
Glucose-Capillary: 77 mg/dL (ref 70–99)
Glucose-Capillary: 104 mg/dL — ABNORMAL HIGH (ref 70–99)
Glucose-Capillary: 106 mg/dL — ABNORMAL HIGH (ref 70–99)
Glucose-Capillary: 117 mg/dL — ABNORMAL HIGH (ref 70–99)
Glucose-Capillary: 128 mg/dL — ABNORMAL HIGH (ref 70–99)
Glucose-Capillary: 103 mg/dL — ABNORMAL HIGH (ref 70–99)

## 2019-08-07 LAB — BASIC METABOLIC PANEL
Anion gap: 8 (ref 5–15)
BUN: 24 mg/dL — ABNORMAL HIGH (ref 6–20)
CO2: 24 mmol/L (ref 22–32)
Calcium: 8.5 mg/dL — ABNORMAL LOW (ref 8.9–10.3)
Chloride: 111 mmol/L (ref 98–111)
Creatinine, Ser: 0.64 mg/dL (ref 0.61–1.24)
GFR calc Af Amer: >60 mL/min (ref >60)
GFR calc non Af Amer: >60 mL/min (ref >60)
Glucose, Bld: 106 mg/dL — ABNORMAL HIGH (ref 70–99)
Potassium: 5 mmol/L (ref 3.5–5.1)
Sodium: 143 mmol/L (ref 135–145)

## 2019-08-07 LAB — MAGNESIUM: Magnesium: 1.8 mg/dL (ref 1.7–2.4)

## 2019-08-07 LAB — CBC
HCT: 37.7 % — ABNORMAL LOW (ref 39.0–52.0)
Hemoglobin: 11.4 g/dL — ABNORMAL LOW (ref 13.0–17.0)
MCH: 27.7 pg (ref 26.0–34.0)
MCHC: 30.2 g/dL (ref 30.0–36.0)
MCV: 91.7 fL (ref 80.0–100.0)
Platelets: 209 10*3/uL (ref 150–400)
RBC: 4.11 MIL/uL — ABNORMAL LOW (ref 4.22–5.81)
RDW: 15.1 % (ref 11.5–15.5)
WBC: 11.8 10*3/uL — ABNORMAL HIGH (ref 4.0–10.5)
nRBC: 0 % (ref 0.0–0.2)

## 2019-08-07 LAB — PHOSPHORUS: Phosphorus: 3.3 mg/dL (ref 2.5–4.6)

## 2019-08-07 NOTE — TOC Progression Note (Signed)
Transition of Care [facility]) - Progression Note    Patient Details  Name: Jay Cole MRN: [id number] Date of Birth: 1995-07-20  [facility] CM/SW Contact  Oren Section Cleta Alberts, RN Phone Number:  Clinical Narrative:  [phone]: Insurance has approved pt for admission, and per Chante with [facility], pt has bed available at facility today.  Faxed Chante in admissions all updated progress notes and labs, per her request.  Able to coordinate transport with [facility], as no other transport agency available until 8pm this evening.  [facility] would also require payment by family or facility of $2400 upfront prior to transport.    1100AM Notified by Yetta Barre admitting that admitting physician has concerns about pt's hemodynamic stability, and wishes to speak with attending MD prior to transfer.  Dr. Bobbye Morton has been given this MD's information and will call him.    12:17PM  Dr. Bobbye Morton has spoken with Dr. Raynald Blend of [facility]; he prefers to cancel admission today, until he has reviewed pt's records.  He would like to re-conference around noon on Thursday to discuss patient and pending admission.  Bedside nurse to update pt's mother, who is at bedside.    [place], Mudlogger of [facility] of cancellation of LTAC admission today, and need for transport.  He states that [facility] will be available to assist this pt with further transport needs, pending staff and resource availability.        Expected Discharge Plan: Long Term Acute Care (LTAC) Barriers to Discharge: Continued Medical Work up, Vent Bed not available  Expected Discharge Plan and Services Expected Discharge Plan: Long Term Acute Care (LTAC)   Discharge Planning Services: CM Consult Post Acute Care Choice: Long Term Acute Care (LTAC) Living arrangements for the past 2 months: Single Family Home                                       Social Determinants of Health (SDOH) Interventions    Readmission Risk  Interventions No flowsheet data found.  Reinaldo Raddle, RN, BSN  Trauma/Neuro ICU Case Manager [phone]
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
## 2019-08-07 NOTE — Progress Notes (Signed)
Subjective: Patient on vent via trach, transitioning to trach collar for a few hours each day.  Continues on tube feedings via G-tube.  Objective: Vital signs in last 24 hours: Vitals:   08/07/19 0510 08/07/19 0600 08/07/19 0747 08/07/19 0800  BP: (!) 122/105 (!) 169/146 (!) 149/123   Pulse: (!) 174 60 (!) 166   Resp: (!) 51 (!) 56 (!) 52   Temp:  (!) 103.8 F (39.9 C)  (!) 100.4 F (38 C)  TempSrc:  Axillary  Axillary  SpO2: 97% 97% 96%   Weight:      Height:        Intake/Output from previous day: 02/16 0701 - 02/17 0700 In: 2329.9 [I.V.:490.2; NG/GT:1757.6; IV Piggyback:82.2] Out: 3400 [Urine:3400] Intake/Output this shift: No intake/output data recorded.  Physical Exam: Opening of eyes, but no response to command.  Remains bilateral decerebrate.  Scalp flap full, but soft and pulsating well.  CBC Recent Labs    08/06/19 0314 08/07/19 0613  WBC 7.9 11.8*  HGB 10.0* 11.4*  HCT 33.5* 37.7*  PLT 178 209   BMET Recent Labs    08/06/19 0314 08/07/19 0613  NA 145 143  K 3.1* 5.0  CL 116* 111  CO2 22 24  GLUCOSE 136* 106*  BUN 31* 24*  CREATININE 0.72 0.64  CALCIUM 7.5* 8.5*    Assessment/Plan: Change from neurosurgical perspective.  Patient continues to have fever, tachycardia, etc. 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  Gnosis remains for poor neurologic recovery.  Case discussed with Dr. Bobbye Morton from trauma surgery.   Jay Spangle, MD 08/07/2019, 8:45 AM

## 2019-08-07 NOTE — Progress Notes (Signed)
Patient has been neuro storming since around 0230.  He has received, a couple doses of prn versed, fentanyl has been increased,  precedex has been restarted, and pt has been given metoprolol for his HR in the 160's-170's, which has not helped.  Pt has developed a temp of 103 since the storming began.  Cooling blanket is in place, fan in use.  Pt's gown has been removed due to being saturated with sweat.  Pt received 1000 mg of scheduled tylenol just prior to the temp, so he did not receive any extra doses.   Will continue to monitor pt.

## 2019-08-08 LAB — MRSA PCR SCREENING: MRSA by PCR: NEGATIVE

## 2019-08-08 LAB — BASIC METABOLIC PANEL
Anion gap: 12 (ref 5–15)
BUN: 27 mg/dL — ABNORMAL HIGH (ref 6–20)
CO2: 24 mmol/L (ref 22–32)
Calcium: 8.6 mg/dL — ABNORMAL LOW (ref 8.9–10.3)
Chloride: 105 mmol/L (ref 98–111)
Creatinine, Ser: 0.43 mg/dL — ABNORMAL LOW (ref 0.61–1.24)
GFR calc Af Amer: >60 mL/min (ref >60)
GFR calc non Af Amer: >60 mL/min (ref >60)
Glucose, Bld: 139 mg/dL — ABNORMAL HIGH (ref 70–99)
Potassium: 3.4 mmol/L — ABNORMAL LOW (ref 3.5–5.1)
Sodium: 141 mmol/L (ref 135–145)

## 2019-08-08 LAB — CBC
HCT: 33.3 % — ABNORMAL LOW (ref 39.0–52.0)
Hemoglobin: 9.9 g/dL — ABNORMAL LOW (ref 13.0–17.0)
MCH: 27.6 pg (ref 26.0–34.0)
MCHC: 29.7 g/dL — ABNORMAL LOW (ref 30.0–36.0)
MCV: 92.8 fL (ref 80.0–100.0)
Platelets: 136 10*3/uL — ABNORMAL LOW (ref 150–400)
RBC: 3.59 MIL/uL — ABNORMAL LOW (ref 4.22–5.81)
RDW: 14.7 % (ref 11.5–15.5)
WBC: 8.1 10*3/uL (ref 4.0–10.5)
nRBC: 0 % (ref 0.0–0.2)

## 2019-08-08 LAB — PHOSPHORUS: Phosphorus: 4.6 mg/dL (ref 2.5–4.6)

## 2019-08-08 LAB — GLUCOSE, CAPILLARY
Glucose-Capillary: 116 mg/dL — ABNORMAL HIGH (ref 70–99)
Glucose-Capillary: 113 mg/dL — ABNORMAL HIGH (ref 70–99)
Glucose-Capillary: 118 mg/dL — ABNORMAL HIGH (ref 70–99)
Glucose-Capillary: 113 mg/dL — ABNORMAL HIGH (ref 70–99)
Glucose-Capillary: 117 mg/dL — ABNORMAL HIGH (ref 70–99)
Glucose-Capillary: 122 mg/dL — ABNORMAL HIGH (ref 70–99)

## 2019-08-08 LAB — MAGNESIUM: Magnesium: 2.1 mg/dL (ref 1.7–2.4)

## 2019-08-08 MED ORDER — QUETIAPINE FUMARATE 200 MG PO TABS
400.0000 mg | ORAL_TABLET | Freq: Three times a day (TID) | ORAL | Status: DC
  Administered 2019-08-08 – 2019-09-08 (×93): 400 mg
  Filled 2019-08-08 (×93): qty 2

## 2019-08-08 MED ORDER — POTASSIUM CHLORIDE 20 MEQ/15ML (10%) PO SOLN
40.0000 meq | Freq: Once | ORAL | Status: AC
  Administered 2019-08-08: 40 meq
  Filled 2019-08-08: qty 30
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
## 2019-08-08 NOTE — TOC Progression Note (Signed)
Transition of Care [facility]) - Progression Note    Patient Details  Name: ZAINE ZUHLKE MRN: [id number] Date of Birth: 07-10-1995  Transition of Care [facility] CM/SW Contact  Oren Section Cleta Alberts, RN Phone Number: 08/08/2019, 3:23 PM  Clinical Narrative:   Notified by Duanne Moron in admissions at [facility] that pt has been declined for admission to [facility].  Notified attending MD; will update pt's mother.      Expected Discharge Plan: Long Term Acute Care (LTAC) Barriers to Discharge: Continued Medical Work up, Vent Bed not available  Expected Discharge Plan and Services Expected Discharge Plan: Long Term Acute Care (LTAC)   Discharge Planning Services: CM Consult Post Acute Care Choice: Long Term Acute Care (LTAC) Living arrangements for the past 2 months: Single Family Home                                       Social Determinants of Health (SDOH) Interventions    Readmission Risk Interventions No flowsheet data found.  Reinaldo Raddle, RN, BSN  Trauma/Neuro ICU Case Manager [phone]
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
## 2019-08-09 LAB — CULTURE, BLOOD (ROUTINE X 2)
Culture: NO GROWTH
Culture: NO GROWTH

## 2019-08-09 LAB — GLUCOSE, CAPILLARY
Glucose-Capillary: 83 mg/dL (ref 70–99)
Glucose-Capillary: 122 mg/dL — ABNORMAL HIGH (ref 70–99)
Glucose-Capillary: 124 mg/dL — ABNORMAL HIGH (ref 70–99)
Glucose-Capillary: 170 mg/dL — ABNORMAL HIGH (ref 70–99)
Glucose-Capillary: 145 mg/dL — ABNORMAL HIGH (ref 70–99)
Glucose-Capillary: 116 mg/dL — ABNORMAL HIGH (ref 70–99)

## 2019-08-09 LAB — BASIC METABOLIC PANEL
Anion gap: 11 (ref 5–15)
BUN: 32 mg/dL — ABNORMAL HIGH (ref 6–20)
CO2: 25 mmol/L (ref 22–32)
Calcium: 8.8 mg/dL — ABNORMAL LOW (ref 8.9–10.3)
Chloride: 106 mmol/L (ref 98–111)
Creatinine, Ser: 0.71 mg/dL (ref 0.61–1.24)
GFR calc Af Amer: >60 mL/min (ref >60)
GFR calc non Af Amer: >60 mL/min (ref >60)
Glucose, Bld: 134 mg/dL — ABNORMAL HIGH (ref 70–99)
Potassium: 4.2 mmol/L (ref 3.5–5.1)
Sodium: 142 mmol/L (ref 135–145)

## 2019-08-09 LAB — CBC
HCT: 34.8 % — ABNORMAL LOW (ref 39.0–52.0)
Hemoglobin: 10.7 g/dL — ABNORMAL LOW (ref 13.0–17.0)
MCH: 28.1 pg (ref 26.0–34.0)
MCHC: 30.7 g/dL (ref 30.0–36.0)
MCV: 91.3 fL (ref 80.0–100.0)
Platelets: 247 10*3/uL (ref 150–400)
RBC: 3.81 MIL/uL — ABNORMAL LOW (ref 4.22–5.81)
RDW: 14.7 % (ref 11.5–15.5)
WBC: 11.1 10*3/uL — ABNORMAL HIGH (ref 4.0–10.5)
nRBC: 0 % (ref 0.0–0.2)

## 2019-08-09 LAB — MAGNESIUM: Magnesium: 2.4 mg/dL (ref 1.7–2.4)

## 2019-08-09 LAB — PHOSPHORUS: Phosphorus: 5.8 mg/dL — ABNORMAL HIGH (ref 2.5–4.6)

## 2019-08-09 NOTE — Progress Notes (Signed)
Spoke patient's mother on the phone. Provided her with an update. All questions and concerns addressed. Appreciative of update.
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
## 2019-08-10 LAB — PHOSPHORUS: Phosphorus: 4.2 mg/dL (ref 2.5–4.6)

## 2019-08-10 LAB — GLUCOSE, CAPILLARY
Glucose-Capillary: 162 mg/dL — ABNORMAL HIGH (ref 70–99)
Glucose-Capillary: 126 mg/dL — ABNORMAL HIGH (ref 70–99)
Glucose-Capillary: 105 mg/dL — ABNORMAL HIGH (ref 70–99)
Glucose-Capillary: 115 mg/dL — ABNORMAL HIGH (ref 70–99)
Glucose-Capillary: 118 mg/dL — ABNORMAL HIGH (ref 70–99)
Glucose-Capillary: 108 mg/dL — ABNORMAL HIGH (ref 70–99)

## 2019-08-10 LAB — CBC
HCT: 32.3 % — ABNORMAL LOW (ref 39.0–52.0)
Hemoglobin: 9.7 g/dL — ABNORMAL LOW (ref 13.0–17.0)
MCH: 27.8 pg (ref 26.0–34.0)
MCHC: 30 g/dL (ref 30.0–36.0)
MCV: 92.6 fL (ref 80.0–100.0)
Platelets: 254 10*3/uL (ref 150–400)
RBC: 3.49 MIL/uL — ABNORMAL LOW (ref 4.22–5.81)
RDW: 15.2 % (ref 11.5–15.5)
WBC: 8.4 10*3/uL (ref 4.0–10.5)
nRBC: 0 % (ref 0.0–0.2)

## 2019-08-10 LAB — BASIC METABOLIC PANEL
Anion gap: 9 (ref 5–15)
BUN: 24 mg/dL — ABNORMAL HIGH (ref 6–20)
CO2: 27 mmol/L (ref 22–32)
Calcium: 8.7 mg/dL — ABNORMAL LOW (ref 8.9–10.3)
Chloride: 103 mmol/L (ref 98–111)
Creatinine, Ser: 0.53 mg/dL — ABNORMAL LOW (ref 0.61–1.24)
GFR calc Af Amer: >60 mL/min (ref >60)
GFR calc non Af Amer: >60 mL/min (ref >60)
Glucose, Bld: 165 mg/dL — ABNORMAL HIGH (ref 70–99)
Potassium: 3.7 mmol/L (ref 3.5–5.1)
Sodium: 139 mmol/L (ref 135–145)

## 2019-08-10 LAB — MAGNESIUM: Magnesium: 2.2 mg/dL (ref 1.7–2.4)

## 2019-08-10 MED ORDER — SODIUM CHLORIDE 0.9 % IV BOLUS
1000.0000 mL | Freq: Once | INTRAVENOUS | Status: AC
  Administered 2019-08-10: 1000 mL via INTRAVENOUS

## 2019-08-10 MED ORDER — MIDAZOLAM 50MG/50ML (1MG/ML) PREMIX INFUSION
1.0000 mg/h | INTRAVENOUS | Status: DC
  Administered 2019-08-10: 4 mg/h via INTRAVENOUS
  Administered 2019-08-10: 1 mg/h via INTRAVENOUS
  Filled 2019-08-10 (×2): qty 50

## 2019-08-10 NOTE — Progress Notes (Signed)
PT became very SOB, with a RR in the 40s. Placed back back on full support ventilation. RT will monitor.
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
## 2019-08-10 NOTE — Progress Notes (Signed)
PT was placed on trach collar 40%/10L. Pt is stable at this time. RT will monitor.

## 2019-08-11 LAB — GLUCOSE, CAPILLARY
Glucose-Capillary: 105 mg/dL — ABNORMAL HIGH (ref 70–99)
Glucose-Capillary: 115 mg/dL — ABNORMAL HIGH (ref 70–99)
Glucose-Capillary: 126 mg/dL — ABNORMAL HIGH (ref 70–99)
Glucose-Capillary: 129 mg/dL — ABNORMAL HIGH (ref 70–99)
Glucose-Capillary: 86 mg/dL (ref 70–99)
Glucose-Capillary: 87 mg/dL (ref 70–99)

## 2019-08-11 LAB — COMPREHENSIVE METABOLIC PANEL
ALT: 457 U/L — ABNORMAL HIGH (ref 0–44)
AST: 236 U/L — ABNORMAL HIGH (ref 15–41)
Albumin: 2.9 g/dL — ABNORMAL LOW (ref 3.5–5.0)
Alkaline Phosphatase: 92 U/L (ref 38–126)
Anion gap: 12 (ref 5–15)
BUN: 35 mg/dL — ABNORMAL HIGH (ref 6–20)
CO2: 22 mmol/L (ref 22–32)
Calcium: 8.2 mg/dL — ABNORMAL LOW (ref 8.9–10.3)
Chloride: 115 mmol/L — ABNORMAL HIGH (ref 98–111)
Creatinine, Ser: 0.72 mg/dL (ref 0.61–1.24)
GFR calc Af Amer: >60 mL/min (ref >60)
GFR calc non Af Amer: >60 mL/min (ref >60)
Glucose, Bld: 112 mg/dL — ABNORMAL HIGH (ref 70–99)
Potassium: 3.8 mmol/L (ref 3.5–5.1)
Sodium: 149 mmol/L — ABNORMAL HIGH (ref 135–145)
Total Bilirubin: 0.7 mg/dL (ref 0.3–1.2)
Total Protein: 5.7 g/dL — ABNORMAL LOW (ref 6.5–8.1)

## 2019-08-11 LAB — CBC
HCT: 35.7 % — ABNORMAL LOW (ref 39.0–52.0)
Hemoglobin: 10.5 g/dL — ABNORMAL LOW (ref 13.0–17.0)
MCH: 28 pg (ref 26.0–34.0)
MCHC: 29.4 g/dL — ABNORMAL LOW (ref 30.0–36.0)
MCV: 95.2 fL (ref 80.0–100.0)
Platelets: 269 10*3/uL (ref 150–400)
RBC: 3.75 MIL/uL — ABNORMAL LOW (ref 4.22–5.81)
RDW: 16.6 % — ABNORMAL HIGH (ref 11.5–15.5)
WBC: 12.2 10*3/uL — ABNORMAL HIGH (ref 4.0–10.5)
nRBC: 0.2 % (ref 0.0–0.2)

## 2019-08-11 MED ORDER — LORAZEPAM 2 MG/ML IJ SOLN
2.0000 mg | INTRAMUSCULAR | Status: DC | PRN
  Administered 2019-08-14 – 2019-09-07 (×16): 2 mg via INTRAVENOUS
  Filled 2019-08-11 (×17): qty 1

## 2019-08-11 MED ORDER — FREE WATER
200.0000 mL | Freq: Three times a day (TID) | Status: DC
  Administered 2019-08-11 – 2019-08-20 (×27): 200 mL
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
## 2019-08-12 LAB — COMPREHENSIVE METABOLIC PANEL
ALT: 364 U/L — ABNORMAL HIGH (ref 0–44)
AST: 111 U/L — ABNORMAL HIGH (ref 15–41)
Albumin: 2.6 g/dL — ABNORMAL LOW (ref 3.5–5.0)
Alkaline Phosphatase: 87 U/L (ref 38–126)
Anion gap: 9 (ref 5–15)
BUN: 22 mg/dL — ABNORMAL HIGH (ref 6–20)
CO2: 25 mmol/L (ref 22–32)
Calcium: 8.2 mg/dL — ABNORMAL LOW (ref 8.9–10.3)
Chloride: 112 mmol/L — ABNORMAL HIGH (ref 98–111)
Creatinine, Ser: 0.43 mg/dL — ABNORMAL LOW (ref 0.61–1.24)
GFR calc Af Amer: >60 mL/min (ref >60)
GFR calc non Af Amer: >60 mL/min (ref >60)
Glucose, Bld: 112 mg/dL — ABNORMAL HIGH (ref 70–99)
Potassium: 3.7 mmol/L (ref 3.5–5.1)
Sodium: 146 mmol/L — ABNORMAL HIGH (ref 135–145)
Total Bilirubin: 0.7 mg/dL (ref 0.3–1.2)
Total Protein: 5.1 g/dL — ABNORMAL LOW (ref 6.5–8.1)

## 2019-08-12 LAB — GLUCOSE, CAPILLARY
Glucose-Capillary: 100 mg/dL — ABNORMAL HIGH (ref 70–99)
Glucose-Capillary: 116 mg/dL — ABNORMAL HIGH (ref 70–99)
Glucose-Capillary: 122 mg/dL — ABNORMAL HIGH (ref 70–99)
Glucose-Capillary: 125 mg/dL — ABNORMAL HIGH (ref 70–99)
Glucose-Capillary: 137 mg/dL — ABNORMAL HIGH (ref 70–99)
Glucose-Capillary: 89 mg/dL (ref 70–99)
Glucose-Capillary: 92 mg/dL (ref 70–99)

## 2019-08-12 NOTE — Progress Notes (Signed)
Patient ID: Jay Cole, male   DOB: Oct 23, 1995, 24 y.o.   MRN: [id number] Vital signs are stable currently. Patient's neuro storming has been slightly decreased but still not wean from vent and posturing remains problematic with significant flexion and internal rotation in the right side and extensor posturing on the left.  Eyes will open spontaneously but patient does not respond to threat. Continue supportive care as attempts to wean vent continue flap remains sunken.
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
## 2019-08-13 LAB — URINALYSIS, ROUTINE W REFLEX MICROSCOPIC
Bilirubin Urine: NEGATIVE
Glucose, UA: NEGATIVE mg/dL
Ketones, ur: NEGATIVE mg/dL
Nitrite: NEGATIVE
Protein, ur: 100 mg/dL — AB
Specific Gravity, Urine: 1.025 (ref 1.005–1.030)
pH: 5 (ref 5.0–8.0)

## 2019-08-13 LAB — GLUCOSE, CAPILLARY
Glucose-Capillary: 118 mg/dL — ABNORMAL HIGH (ref 70–99)
Glucose-Capillary: 94 mg/dL (ref 70–99)
Glucose-Capillary: 98 mg/dL (ref 70–99)
Glucose-Capillary: 114 mg/dL — ABNORMAL HIGH (ref 70–99)
Glucose-Capillary: 92 mg/dL (ref 70–99)
Glucose-Capillary: 70 mg/dL (ref 70–99)

## 2019-08-13 MED ORDER — LACTATED RINGERS IV BOLUS
1000.0000 mL | Freq: Once | INTRAVENOUS | Status: AC
  Administered 2019-08-13: 17:00:00 1000 mL via INTRAVENOUS

## 2019-08-13 MED ORDER — LACTATED RINGERS IV BOLUS
1000.0000 mL | Freq: Once | INTRAVENOUS | Status: AC
  Administered 2019-08-13: 1000 mL via INTRAVENOUS

## 2019-08-13 MED ORDER — IBUPROFEN 100 MG/5ML PO SUSP
800.0000 mg | Freq: Once | ORAL | Status: AC
  Administered 2019-08-13: 800 mg
  Filled 2019-08-13: qty 40
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
## 2019-08-13 NOTE — Progress Notes (Signed)
RN called RT about patient neuro storming after being placed on ATC. Patient is back on the ventilator at this time on full support due to increased RR, HR, and excessive agitation.

## 2019-08-13 NOTE — Progress Notes (Signed)
Patient ID: Jay Cole, male   DOB: 1995/12/23, 24 y.o.   MRN: [id number] Patient continues neuro storming.  Extensor posturing on the left and flexor on the right continues without much change.  Supportive care as noted by trauma team with further concerns about pulmonary infections.

## 2019-08-13 NOTE — Progress Notes (Signed)
Patient taken off of the ventilator and placed on ATC per MD request. Patient appears to be tolerating well at this time. RT will make RN aware.

## 2019-08-13 NOTE — Progress Notes (Signed)
After pt placed on ATC he started storming immediately. RN gave PRNs and repositioned pt several times with no change. After about 30 min patient placed back on vent support and still storming. It has been minimum of 45 min with HR in the 140-180's. He is drenched in sweat and RR is 35+. Austina Constantin, Rande Brunt, RN

## 2019-08-13 NOTE — Progress Notes (Signed)
Sputum sample obtained and taken to lab

## 2019-08-14 LAB — URINE CULTURE: Culture: NO GROWTH

## 2019-08-14 LAB — GLUCOSE, CAPILLARY
Glucose-Capillary: 107 mg/dL — ABNORMAL HIGH (ref 70–99)
Glucose-Capillary: 82 mg/dL (ref 70–99)
Glucose-Capillary: 102 mg/dL — ABNORMAL HIGH (ref 70–99)
Glucose-Capillary: 108 mg/dL — ABNORMAL HIGH (ref 70–99)
Glucose-Capillary: 120 mg/dL — ABNORMAL HIGH (ref 70–99)
Glucose-Capillary: 112 mg/dL — ABNORMAL HIGH (ref 70–99)

## 2019-08-14 MED ORDER — LACTATED RINGERS IV BOLUS
1000.0000 mL | Freq: Once | INTRAVENOUS | Status: AC
  Administered 2019-08-14: 1000 mL via INTRAVENOUS
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
## 2019-08-14 NOTE — Progress Notes (Signed)
Patient ID: Jay Cole, male   DOB: 1995/10/05, 24 y.o.   MRN: [id number] Temp has been persisting and low-grade about 100 degrees Neuro storming events continue with usual peak towards the late mornings Continue supportive care.

## 2019-08-15 LAB — GLUCOSE, CAPILLARY
Glucose-Capillary: 122 mg/dL — ABNORMAL HIGH (ref 70–99)
Glucose-Capillary: 81 mg/dL (ref 70–99)
Glucose-Capillary: 119 mg/dL — ABNORMAL HIGH (ref 70–99)
Glucose-Capillary: 107 mg/dL — ABNORMAL HIGH (ref 70–99)
Glucose-Capillary: 123 mg/dL — ABNORMAL HIGH (ref 70–99)
Glucose-Capillary: 115 mg/dL — ABNORMAL HIGH (ref 70–99)

## 2019-08-15 MED ORDER — ASPIRIN 325 MG PO TABS: 325.0000 mg | ORAL_TABLET | ORAL | Status: DC | PRN

## 2019-08-15 MED ORDER — IBUPROFEN 100 MG/5ML PO SUSP
800.0000 mg | Freq: Three times a day (TID) | ORAL | Status: DC | PRN
  Administered 2019-08-15 – 2019-09-11 (×14): 800 mg
  Filled 2019-08-15 (×16): qty 40

## 2019-08-15 MED ORDER — LACTATED RINGERS IV BOLUS
1000.0000 mL | Freq: Once | INTRAVENOUS | Status: AC
  Administered 2019-08-15: 1000 mL via INTRAVENOUS

## 2019-08-15 MED ORDER — ASPIRIN 325 MG PO TABS
325.0000 mg | ORAL_TABLET | ORAL | Status: DC | PRN
  Administered 2019-08-15 – 2019-08-18 (×2): 325 mg
  Filled 2019-08-15 (×2): qty 1
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
## 2019-08-15 NOTE — Progress Notes (Signed)
Patient ID: [name], male   DOB: 06-16-1996, 24 y.o.   MRN: [id number]   Intermittent social vistis during this hospitalization creating space and opportunity for patient's mother to explore her thoughts and feelings regarding her son's current medical situation and attempting to build a trusting relationship.  Today  this NP visited patient at the bedside hoping to speak with Mom in person unfortunately we missed each other.  I did speak to her by phone.   Again I gently offered information/education on the seriousness of his brain injury and the poor prognosis  for  meaningful recovery.  We discussed that unfortunately medical interventions are limited at times,  to prolong quality of life..  Emotional support offered      Chaplain  services are involved  Questions and concerns addressed   Discussed with bedside RN  Palliative medicine team will continue to support holistically  Total time spent on the unit was 20 minutes  Greater than 50% of the time was spent in counseling and coordination of care  Wadie Lessen NP  Palliative Medicine Team Team Phone # [phone] Pager [phone]
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
## 2019-08-16 LAB — CBC
HCT: 33.1 % — ABNORMAL LOW (ref 39.0–52.0)
Hemoglobin: 9.8 g/dL — ABNORMAL LOW (ref 13.0–17.0)
MCH: 27.7 pg (ref 26.0–34.0)
MCHC: 29.6 g/dL — ABNORMAL LOW (ref 30.0–36.0)
MCV: 93.5 fL (ref 80.0–100.0)
Platelets: 202 10*3/uL (ref 150–400)
RBC: 3.54 MIL/uL — ABNORMAL LOW (ref 4.22–5.81)
RDW: 17.4 % — ABNORMAL HIGH (ref 11.5–15.5)
WBC: 10.9 10*3/uL — ABNORMAL HIGH (ref 4.0–10.5)
nRBC: 0 % (ref 0.0–0.2)

## 2019-08-16 LAB — CULTURE, RESPIRATORY W GRAM STAIN

## 2019-08-16 LAB — BASIC METABOLIC PANEL
Anion gap: 9 (ref 5–15)
BUN: 32 mg/dL — ABNORMAL HIGH (ref 6–20)
CO2: 23 mmol/L (ref 22–32)
Calcium: 8.4 mg/dL — ABNORMAL LOW (ref 8.9–10.3)
Chloride: 117 mmol/L — ABNORMAL HIGH (ref 98–111)
Creatinine, Ser: 0.83 mg/dL (ref 0.61–1.24)
GFR calc Af Amer: >60 mL/min (ref >60)
GFR calc non Af Amer: >60 mL/min (ref >60)
Glucose, Bld: 116 mg/dL — ABNORMAL HIGH (ref 70–99)
Potassium: 4.2 mmol/L (ref 3.5–5.1)
Sodium: 149 mmol/L — ABNORMAL HIGH (ref 135–145)

## 2019-08-16 LAB — GLUCOSE, CAPILLARY
Glucose-Capillary: 124 mg/dL — ABNORMAL HIGH (ref 70–99)
Glucose-Capillary: 104 mg/dL — ABNORMAL HIGH (ref 70–99)
Glucose-Capillary: 139 mg/dL — ABNORMAL HIGH (ref 70–99)
Glucose-Capillary: 124 mg/dL — ABNORMAL HIGH (ref 70–99)
Glucose-Capillary: 139 mg/dL — ABNORMAL HIGH (ref 70–99)
Glucose-Capillary: 150 mg/dL — ABNORMAL HIGH (ref 70–99)

## 2019-08-16 LAB — PHOSPHORUS: Phosphorus: 3.6 mg/dL (ref 2.5–4.6)

## 2019-08-16 LAB — MAGNESIUM: Magnesium: 2.4 mg/dL (ref 1.7–2.4)

## 2019-08-16 MED ORDER — SODIUM BICARBONATE 8.4 % IV SOLN
INTRAVENOUS | Status: AC
  Filled 2019-08-16: qty 50
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
## 2019-08-16 NOTE — Progress Notes (Signed)
Patient ID: Jay Cole, male   DOB: 02-15-1996, 24 y.o.   MRN: [id number] Neuro storming pattern does not seem to be changing much continue supportive care with little improvement over the past several weeks.  Family still desires aggressive measures.
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
## 2019-08-16 NOTE — TOC Progression Note (Signed)
Transition of Care [facility]) - Progression Note    Patient Details  Name: DAREAN LIMAS MRN: [id number] Date of Birth: 02/15/1996  Transition of [facility]) CM/SW Contact  Ella Bodo, RN Phone Number: 08/16/2019, 5:02 PM  Clinical Narrative:  Damaris Schooner with Lattie Haw at [facility] in [place], after leaving multiple messages for admissions coordinator:  She states pt's case will be reviewed on Monday by admissions coordinator, and I will receive a phone call with decision.    Spoke with Mardee Postin, admissions coordinator with [facility] vent SNF in [place], [place]:  We discussed pt's neurostorming events, and she states that pt may be eligible for admission if storming has reached stability.  She is willing to accept referral for review.  Faxed FL2 and clinical information for review; hopeful to have discussion with her about pt early next week.       Expected Discharge Plan: Long Term Acute Care (LTAC) Barriers to Discharge: Continued Medical Work up, Vent Bed not available  Expected Discharge Plan and Services Expected Discharge Plan: Long Term Acute Care (LTAC)   Discharge Planning Services: CM Consult Post Acute Care Choice: Long Term Acute Care (LTAC) Living arrangements for the past 2 months: Single Family Home                                       Social Determinants of Health (SDOH) Interventions    Readmission Risk Interventions No flowsheet data found.  Reinaldo Raddle, RN, BSN  Trauma/Neuro ICU Case Manager [phone]

## 2019-08-17 LAB — CBC
HCT: 33.9 % — ABNORMAL LOW (ref 39.0–52.0)
Hemoglobin: 10 g/dL — ABNORMAL LOW (ref 13.0–17.0)
MCH: 27.6 pg (ref 26.0–34.0)
MCHC: 29.5 g/dL — ABNORMAL LOW (ref 30.0–36.0)
MCV: 93.6 fL (ref 80.0–100.0)
Platelets: 195 10*3/uL (ref 150–400)
RBC: 3.62 MIL/uL — ABNORMAL LOW (ref 4.22–5.81)
RDW: 17.1 % — ABNORMAL HIGH (ref 11.5–15.5)
WBC: 9.6 10*3/uL (ref 4.0–10.5)
nRBC: 0 % (ref 0.0–0.2)

## 2019-08-17 LAB — GLUCOSE, CAPILLARY
Glucose-Capillary: 116 mg/dL — ABNORMAL HIGH (ref 70–99)
Glucose-Capillary: 143 mg/dL — ABNORMAL HIGH (ref 70–99)
Glucose-Capillary: 165 mg/dL — ABNORMAL HIGH (ref 70–99)
Glucose-Capillary: 63 mg/dL — ABNORMAL LOW (ref 70–99)
Glucose-Capillary: 103 mg/dL — ABNORMAL HIGH (ref 70–99)
Glucose-Capillary: 129 mg/dL — ABNORMAL HIGH (ref 70–99)
Glucose-Capillary: 157 mg/dL — ABNORMAL HIGH (ref 70–99)

## 2019-08-17 LAB — PHOSPHORUS: Phosphorus: 5 mg/dL — ABNORMAL HIGH (ref 2.5–4.6)

## 2019-08-17 LAB — BASIC METABOLIC PANEL
Anion gap: 9 (ref 5–15)
BUN: 38 mg/dL — ABNORMAL HIGH (ref 6–20)
CO2: 27 mmol/L (ref 22–32)
Calcium: 8.9 mg/dL (ref 8.9–10.3)
Chloride: 113 mmol/L — ABNORMAL HIGH (ref 98–111)
Creatinine, Ser: 0.82 mg/dL (ref 0.61–1.24)
GFR calc Af Amer: >60 mL/min (ref >60)
GFR calc non Af Amer: >60 mL/min (ref >60)
Glucose, Bld: 128 mg/dL — ABNORMAL HIGH (ref 70–99)
Potassium: 4.5 mmol/L (ref 3.5–5.1)
Sodium: 149 mmol/L — ABNORMAL HIGH (ref 135–145)

## 2019-08-17 LAB — MAGNESIUM: Magnesium: 2.6 mg/dL — ABNORMAL HIGH (ref 1.7–2.4)

## 2019-08-17 MED ORDER — PHENYLEPHRINE HCL-NACL 10-0.9 MG/250ML-% IV SOLN
INTRAVENOUS | Status: AC
  Filled 2019-08-17: qty 250

## 2019-08-17 NOTE — Progress Notes (Signed)
Assisted tele visit to patient with family member.  Atiba Kimberlin P, RN  
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
## 2019-08-18 LAB — BASIC METABOLIC PANEL
Anion gap: 9 (ref 5–15)
BUN: 44 mg/dL — ABNORMAL HIGH (ref 6–20)
CO2: 22 mmol/L (ref 22–32)
Calcium: 7.8 mg/dL — ABNORMAL LOW (ref 8.9–10.3)
Chloride: 122 mmol/L — ABNORMAL HIGH (ref 98–111)
Creatinine, Ser: 0.92 mg/dL (ref 0.61–1.24)
GFR calc Af Amer: >60 mL/min (ref >60)
GFR calc non Af Amer: >60 mL/min (ref >60)
Glucose, Bld: 95 mg/dL (ref 70–99)
Potassium: 4.6 mmol/L (ref 3.5–5.1)
Sodium: 153 mmol/L — ABNORMAL HIGH (ref 135–145)

## 2019-08-18 LAB — CULTURE, BLOOD (ROUTINE X 2)
Culture: NO GROWTH
Culture: NO GROWTH
Special Requests: ADEQUATE
Special Requests: ADEQUATE

## 2019-08-18 LAB — GLUCOSE, CAPILLARY
Glucose-Capillary: 113 mg/dL — ABNORMAL HIGH (ref 70–99)
Glucose-Capillary: 116 mg/dL — ABNORMAL HIGH (ref 70–99)
Glucose-Capillary: 122 mg/dL — ABNORMAL HIGH (ref 70–99)
Glucose-Capillary: 129 mg/dL — ABNORMAL HIGH (ref 70–99)
Glucose-Capillary: 132 mg/dL — ABNORMAL HIGH (ref 70–99)
Glucose-Capillary: 138 mg/dL — ABNORMAL HIGH (ref 70–99)

## 2019-08-18 LAB — CBC
HCT: 33.5 % — ABNORMAL LOW (ref 39.0–52.0)
Hemoglobin: 9.6 g/dL — ABNORMAL LOW (ref 13.0–17.0)
MCH: 27.8 pg (ref 26.0–34.0)
MCHC: 28.7 g/dL — ABNORMAL LOW (ref 30.0–36.0)
MCV: 97.1 fL (ref 80.0–100.0)
Platelets: 178 10*3/uL (ref 150–400)
RBC: 3.45 MIL/uL — ABNORMAL LOW (ref 4.22–5.81)
RDW: 17.6 % — ABNORMAL HIGH (ref 11.5–15.5)
WBC: 6.2 10*3/uL (ref 4.0–10.5)
nRBC: 0 % (ref 0.0–0.2)

## 2019-08-18 LAB — PHOSPHORUS: Phosphorus: 4.5 mg/dL (ref 2.5–4.6)

## 2019-08-18 LAB — MAGNESIUM: Magnesium: 2.5 mg/dL — ABNORMAL HIGH (ref 1.7–2.4)

## 2019-08-18 MED ORDER — SODIUM CHLORIDE 0.9 % IV BOLUS
1000.0000 mL | Freq: Once | INTRAVENOUS | Status: AC
  Administered 2019-08-18: 1000 mL via INTRAVENOUS

## 2019-08-18 NOTE — Progress Notes (Signed)
Contacted Trauma MD regarding BP of 87/40 (54) and 75/47 (58). Verbal order for 1L bolus of NS.
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
## 2019-08-19 LAB — GLUCOSE, CAPILLARY
Glucose-Capillary: 115 mg/dL — ABNORMAL HIGH (ref 70–99)
Glucose-Capillary: 96 mg/dL (ref 70–99)
Glucose-Capillary: 142 mg/dL — ABNORMAL HIGH (ref 70–99)
Glucose-Capillary: 142 mg/dL — ABNORMAL HIGH (ref 70–99)
Glucose-Capillary: 143 mg/dL — ABNORMAL HIGH (ref 70–99)
Glucose-Capillary: 180 mg/dL — ABNORMAL HIGH (ref 70–99)

## 2019-08-19 MED ORDER — ALBUMIN HUMAN 5 % IV SOLN
25.0000 g | Freq: Once | INTRAVENOUS | Status: AC
  Administered 2019-08-19: 14:00:00 25 g via INTRAVENOUS
  Filled 2019-08-19: qty 250

## 2019-08-19 MED ORDER — ALBUMIN HUMAN 5 % IV SOLN
INTRAVENOUS | Status: AC
  Filled 2019-08-19: qty 250

## 2019-08-19 MED ORDER — VECURONIUM BROMIDE 10 MG IV SOLR
10.0000 mg | Freq: Once | INTRAVENOUS | Status: AC
  Administered 2019-08-19: 10 mg via INTRAVENOUS
  Filled 2019-08-19: qty 10

## 2019-08-19 NOTE — Progress Notes (Signed)
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 Discussion with parents regarding MRI results and prognosis by both Dr. Grandville Silos and Dr. Ellene Route 2/9, mother continues to desire aggressive care. Continued conversations with mother regarding clinical status and lack of improvement by primary trauma team.  Continued prolonged neuro storming    Acute hypoxic ventilator dependent respiratory failure with severe ARDS - trach 1/22, PSV trials again as able Multiple abrasions - local wound  care, ensure adequate pressure off-loading of scalp over crani site  L1 TVP FX - pain control.   HTN - Propranolol and clonidine use for neuro storming  Urinary retention - bethanechol FEN - TF to continue ID - Bactrim for enterobacter and persistent OSSA PNA - completing 14d tomorrow VTE - SCDs, LMWH  Dispo - ICU, dispo planning to LTAC in [place] when no longer neuro storming  Very poor prognosis, especially as storming episodes get longer.     Critical Care Total Time*: 28 Minutes  Georganna Skeans, MD, MPH, FACS Trauma & General Surgery Use [email or web address] to contact on call provider  08/19/2019  *Care during the described time interval was provided by me. I have reviewed this patient's available data, including medical history, events of note, physical examination and test results as part of my evaluation.

## 2019-08-19 NOTE — Progress Notes (Signed)
Patient ID: Jay Cole, male   DOB: Oct 20, 1995, 24 y.o.   MRN: [id number] I called his mother and updated her on the worsening storming. I expressed he has had no neurologic improvement and again clarified goals of care. At this point, she wants to continue full support. She is praying about it and hoping for some divine guidance.  Georganna Skeans, MD, MPH, FACS Please use [email or web address] to contact on call provider

## 2019-08-19 NOTE — Progress Notes (Signed)
Pt tachycardic (150s) tachypneic (40+BPM), hypertensive, shivering and posturing for over one hour following Trach Collar Trial. Temp 104.9 rectal. MD notified. Orders received.

## 2019-08-19 NOTE — Progress Notes (Signed)
Pt still with severe tachycardia, hypertension and tachypneia despite maxing out precedex, and all PRNs given. MD notified. Orders received.

## 2019-08-20 LAB — CBC
HCT: 27.9 % — ABNORMAL LOW (ref 39.0–52.0)
Hemoglobin: 7.9 g/dL — ABNORMAL LOW (ref 13.0–17.0)
MCH: 27.9 pg (ref 26.0–34.0)
MCHC: 28.3 g/dL — ABNORMAL LOW (ref 30.0–36.0)
MCV: 98.6 fL (ref 80.0–100.0)
Platelets: 128 10*3/uL — ABNORMAL LOW (ref 150–400)
RBC: 2.83 MIL/uL — ABNORMAL LOW (ref 4.22–5.81)
RDW: 16.9 % — ABNORMAL HIGH (ref 11.5–15.5)
WBC: 6 10*3/uL (ref 4.0–10.5)
nRBC: 0 % (ref 0.0–0.2)

## 2019-08-20 LAB — BASIC METABOLIC PANEL
Anion gap: 6 (ref 5–15)
BUN: 31 mg/dL — ABNORMAL HIGH (ref 6–20)
CO2: 22 mmol/L (ref 22–32)
Calcium: 8.3 mg/dL — ABNORMAL LOW (ref 8.9–10.3)
Chloride: 126 mmol/L — ABNORMAL HIGH (ref 98–111)
Creatinine, Ser: 0.63 mg/dL (ref 0.61–1.24)
GFR calc Af Amer: >60 mL/min (ref >60)
GFR calc non Af Amer: >60 mL/min (ref >60)
Glucose, Bld: 95 mg/dL (ref 70–99)
Potassium: 3.7 mmol/L (ref 3.5–5.1)
Sodium: 154 mmol/L — ABNORMAL HIGH (ref 135–145)

## 2019-08-20 LAB — GLUCOSE, CAPILLARY
Glucose-Capillary: 140 mg/dL — ABNORMAL HIGH (ref 70–99)
Glucose-Capillary: 109 mg/dL — ABNORMAL HIGH (ref 70–99)
Glucose-Capillary: 131 mg/dL — ABNORMAL HIGH (ref 70–99)
Glucose-Capillary: 125 mg/dL — ABNORMAL HIGH (ref 70–99)
Glucose-Capillary: 137 mg/dL — ABNORMAL HIGH (ref 70–99)
Glucose-Capillary: 134 mg/dL — ABNORMAL HIGH (ref 70–99)

## 2019-08-20 MED ORDER — VECURONIUM BROMIDE 10 MG IV SOLR
10.0000 mg | Freq: Once | INTRAVENOUS | Status: AC
  Administered 2019-08-20: 10 mg via INTRAVENOUS

## 2019-08-20 MED ORDER — FREE WATER
200.0000 mL | Freq: Four times a day (QID) | Status: DC
  Administered 2019-08-20 – 2019-08-21 (×4): 200 mL

## 2019-08-20 MED ORDER — VECURONIUM BROMIDE 10 MG IV SOLR
INTRAVENOUS | Status: AC
  Filled 2019-08-20: qty 10

## 2019-08-20 NOTE — TOC Progression Note (Signed)
Transition of Care [facility]) - Progression Note    Patient Details  Name: Jay Cole MRN: [id number] Date of Birth: April 19, 1996  [facility] CM/SW Contact  Oren Section Cleta Alberts, RN Phone Number: 08/20/2019, 3:32 PM  Clinical Narrative:  Left message for Jonelle Sidle at [facility] (vent SNF in [place], [place]) requesting update on referrals sent last week.  Will provide updates as they are available.    Expected Discharge Plan: Long Term Acute Care (LTAC) Barriers to Discharge: Continued Medical Work up, Vent Bed not available  Expected Discharge Plan and Services Expected Discharge Plan: Long Term Acute Care (LTAC)   Discharge Planning Services: CM Consult Post Acute Care Choice: Long Term Acute Care (LTAC) Living arrangements for the past 2 months: Single Family Home                                       Social Determinants of Health (SDOH) Interventions    Readmission Risk Interventions No flowsheet data found.  Reinaldo Raddle, RN, BSN  Trauma/Neuro ICU Case Manager [phone]

## 2019-08-20 NOTE — Progress Notes (Signed)
Patient ID: Jay Cole, male   DOB: 1995-07-17, 24 y.o.   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  I have again reviewed his films and I note that he has significant ex vacuo changes and I do not believe that further imaging studies are likely to yield any useful information.  I had an extended conversation with Jay Cole's mother Jay Cole regarding [name] situation I note that we have given him very aggressive care and despite our best efforts Jay Cole's condition does not appear to be improving and in fact there is been some worsening with the worsening neuro storming condition.  I noted that ultimately some overwhelming infection can become an issue from which there may be no recovery I believe at this point given the severity of the head injury that he has it is reasonable to predict that a good recovery with some functional restoration is not going to happen and perhaps it would be in Jay Cole's best interest to choose a more conservative approach with supportive measures only.  It seems that Jay Cole is still wanting to continue with aggressive care.  She stated that our facility was supposed to be looking into transfer to [facility] in [place].  I suspect this would be an LTAC and at this time I am not sure that they would have an interest in excepting a patient with [name] instabilities.  I will continue my discussions with her regarding changing the course of care but at this time she does not appear very responsive to that.

## 2019-08-20 NOTE — Progress Notes (Signed)
Patient ID: Jay Cole, male   DOB: 01-11-1996, 24 y.o.   MRN: [id number] Follow up - Trauma Critical Care  Patient Details:    MASON MCELHONE is an 24 y.o. male.  Lines/tubes : PICC Double Lumen 06/26/19 PICC Right Brachial 40 cm 0 cm (Active)  Indication for Insertion or Continuance of Line Poor Vasculature-patient has had multiple peripheral attempts or PIVs lasting less than 24 hours;Prolonged intravenous therapies 08/19/19 2000  Exposed Catheter (cm) 0 cm 06/26/19 2000  Site Assessment Clean;Dry;Intact 08/19/19 2000  Lumen #1 Status In-line blood sampling system in place;Infusing 08/19/19 2000  Lumen #2 Status Infusing 08/19/19 2000  Dressing Type Transparent;Occlusive 08/19/19 2000  Dressing Status Clean;Dry;Intact;Antimicrobial disc in place 08/19/19 [place] checked and tightened 08/19/19 2000  Line Adjustment (NICU/IV Team Only) No 08/13/19 0900  Dressing Intervention New dressing;Antimicrobial disc changed 08/13/19 1354  Dressing Change Due 08/20/19 08/19/19 2000     Gastrostomy/Enterostomy Percutaneous endoscopic gastrostomy (PEG) 24 Fr. (Active)  Surrounding Skin Dry;Intact 08/20/19 0445  Tube Status Patent 08/20/19 0445  Drainage Appearance None 08/20/19 0445  Catheter Position (cm marking) 7 cm 08/11/19 2000  Dressing Status Clean;Dry;Intact 08/18/19 0800  Dressing Intervention Dressing changed 08/19/19 0800  Dressing Type Split gauze 08/20/19 0445  Dressing Change Due 08/16/19 08/14/19 0800  G Port Intake (mL) 15 ml 08/18/19 1800  J Port Intake (mL) 100 ml 08/12/19 1800  Output (mL) 0 mL 08/05/19 2000     Rectal Tube/Pouch (Active)  Output (mL) 275 mL 08/19/19 0500     External Urinary Catheter (Active)  Collection Container Standard drainage bag 08/20/19 0445  Securement Method Securing device (Describe) 08/20/19 0445  Site Assessment Clean;Intact 08/20/19 0445  Intervention Equipment Changed 08/20/19 0600  Output (mL) 400 mL 08/20/19  0600    Microbiology/Sepsis markers: Results for orders placed or performed during the hospital encounter of 07/14/2019  Respiratory Panel by RT PCR (Flu A&B, Covid) - Nasopharyngeal Swab     Status: None   Collection Time: 07/11/2019  8:36 PM   Specimen: Nasopharyngeal Swab  Result Value Ref Range Status   SARS Coronavirus 2 by RT PCR NEGATIVE NEGATIVE Final    Comment: (NOTE) SARS-CoV-2 target nucleic acids are NOT DETECTED. The SARS-CoV-2 RNA is generally detectable in upper respiratoy specimens during the acute phase of infection. The lowest concentration of SARS-CoV-2 viral copies this assay can detect is 131 copies/mL. A negative result does not preclude SARS-Cov-2 infection and should not be used as the sole basis for treatment or other patient management decisions. A negative result may occur with  improper specimen collection/handling, submission of specimen other than nasopharyngeal swab, presence of viral mutation(s) within the areas targeted by this assay, and inadequate number of viral copies (<131 copies/mL). A negative result must be combined with clinical observations, patient history, and epidemiological information. The expected result is Negative. Fact Sheet for Patients:  PinkCheek.be Fact Sheet for Healthcare Providers:  GravelBags.it This test is not yet ap proved or cleared by the [place] FDA and  has been authorized for detection and/or diagnosis of SARS-CoV-2 by FDA under an Emergency Use Authorization (EUA). This EUA will remain  in effect (meaning this test can be used) for the duration of the COVID-19 declaration under Section 564(b)(1) of the Act, 21 U.S.C. section 360bbb-3(b)(1), unless the authorization is terminated or revoked sooner.    Influenza A by PCR NEGATIVE NEGATIVE Final   Influenza B by PCR NEGATIVE NEGATIVE Final  Comment: (NOTE) The Xpert Xpress SARS-CoV-2/FLU/RSV assay is  intended as an aid in  the diagnosis of influenza from Nasopharyngeal swab specimens and  should not be used as a sole basis for treatment. Nasal washings and  aspirates are unacceptable for Xpert Xpress SARS-CoV-2/FLU/RSV  testing. Fact Sheet for Patients: PinkCheek.be Fact Sheet for Healthcare Providers: GravelBags.it This test is not yet approved or cleared by the [place] FDA and  has been authorized for detection and/or diagnosis of SARS-CoV-2 by  FDA under an Emergency Use Authorization (EUA). This EUA will remain  in effect (meaning this test can be used) for the duration of the  Covid-19 declaration under Section 564(b)(1) of the Act, 21  U.S.C. section 360bbb-3(b)(1), unless the authorization is  terminated or revoked. Performed at [facility], [place] [address], [place], [place] [address]   MRSA PCR Screening     Status: None   Collection Time: 06/25/19 12:50 AM   Specimen: Nasal Mucosa; Nasopharyngeal  Result Value Ref Range Status   MRSA by PCR NEGATIVE NEGATIVE Final    Comment:        The GeneXpert MRSA Assay (FDA approved for NASAL specimens only), is one component of a comprehensive MRSA colonization surveillance program. It is not intended to diagnose MRSA infection nor to guide or monitor treatment for MRSA infections. Performed at [facility], [place] [address], [place], [place] [address]   Culture, respiratory (non-expectorated)     Status: None   Collection Time: 07/01/19  7:11 AM   Specimen: Tracheal Aspirate; Respiratory  Result Value Ref Range Status   Specimen Description TRACHEAL ASPIRATE  Final   Special Requests NONE  Final   Gram Stain   Final    FEW WBC PRESENT, PREDOMINANTLY PMN FEW GRAM POSITIVE COCCI IN PAIRS FEW GRAM POSITIVE RODS Performed at [facility], [place] [address] [place], [place] [address]    Culture ABUNDANT STAPHYLOCOCCUS AUREUS  Final   Report  Status 07/09/2019 FINAL  Final   Organism ID, Bacteria STAPHYLOCOCCUS AUREUS  Final      Susceptibility   Staphylococcus aureus - MIC*    CIPROFLOXACIN <=0.5 SENSITIVE Sensitive     ERYTHROMYCIN RESISTANT Resistant     GENTAMICIN <=0.5 SENSITIVE Sensitive     OXACILLIN 0.5 SENSITIVE Sensitive     TETRACYCLINE <=1 SENSITIVE Sensitive     VANCOMYCIN <=0.5 SENSITIVE Sensitive     TRIMETH/SULFA <=10 SENSITIVE Sensitive     CLINDAMYCIN RESISTANT Resistant     RIFAMPIN <=0.5 SENSITIVE Sensitive     Inducible Clindamycin POSITIVE Resistant     * ABUNDANT STAPHYLOCOCCUS AUREUS  Culture, blood (routine x 2)     Status: None   Collection Time: 07/01/19  8:45 AM   Specimen: BLOOD  Result Value Ref Range Status   Specimen Description BLOOD LEFT ANTECUBITAL  Final   Special Requests AEROBIC BOTTLE ONLY Blood Culture adequate volume  Final   Culture   Final    NO GROWTH 5 DAYS Performed at [facility], [place] [address], [place], [place] [address]    Report Status 07/06/2019 FINAL  Final  Culture, blood (routine x 2)     Status: None   Collection Time: 07/01/19  8:52 AM   Specimen: BLOOD  Result Value Ref Range Status   Specimen Description BLOOD LEFT ANTECUBITAL  Final   Special Requests AEROBIC BOTTLE ONLY Blood Culture adequate volume  Final   Culture   Final  NO GROWTH 5 DAYS Performed at [facility], [place] [address] [place], [place] [address]    Report Status 07/06/2019 FINAL  Final  Culture, respiratory (non-expectorated)     Status: None   Collection Time: 07/05/19  9:32 AM   Specimen: Tracheal Aspirate; Respiratory  Result Value Ref Range Status   Specimen Description TRACHEAL ASPIRATE  Final   Special Requests NONE  Final   Gram Stain   Final    MODERATE WBC PRESENT,BOTH PMN AND MONONUCLEAR RARE GRAM POSITIVE COCCI FEW GRAM VARIABLE ROD Performed at [facility], [place] [address], [place], [place] [address]    Culture RARE STAPHYLOCOCCUS AUREUS  Final    Report Status 07/08/2019 FINAL  Final   Organism ID, Bacteria STAPHYLOCOCCUS AUREUS  Final      Susceptibility   Staphylococcus aureus - MIC*    CIPROFLOXACIN <=0.5 SENSITIVE Sensitive     ERYTHROMYCIN RESISTANT Resistant     GENTAMICIN <=0.5 SENSITIVE Sensitive     OXACILLIN 0.5 SENSITIVE Sensitive     TETRACYCLINE <=1 SENSITIVE Sensitive     VANCOMYCIN 1 SENSITIVE Sensitive     TRIMETH/SULFA <=10 SENSITIVE Sensitive     CLINDAMYCIN RESISTANT Resistant     RIFAMPIN <=0.5 SENSITIVE Sensitive     Inducible Clindamycin POSITIVE Resistant     * RARE STAPHYLOCOCCUS AUREUS  Culture, blood (routine x 2)     Status: None   Collection Time: 07/05/19 12:00 PM   Specimen: BLOOD  Result Value Ref Range Status   Specimen Description BLOOD LEFT ANTECUBITAL  Final   Special Requests   Final    BOTTLES DRAWN AEROBIC ONLY Blood Culture adequate volume   Culture   Final    NO GROWTH 5 DAYS Performed at [facility], [address], [place], [place] [address]    Report Status 07/10/2019 FINAL  Final  Culture, blood (routine x 2)     Status: None   Collection Time: 07/05/19 12:23 PM   Specimen: BLOOD  Result Value Ref Range Status   Specimen Description BLOOD LEFT ANTECUBITAL  Final   Special Requests   Final    BOTTLES DRAWN AEROBIC ONLY Blood Culture adequate volume   Culture   Final    NO GROWTH 5 DAYS Performed at [facility], [place] [address], [place], [place] [address]    Report Status 07/10/2019 FINAL  Final  Culture, blood (routine x 2)     Status: None   Collection Time: 07/22/19  2:42 PM   Specimen: BLOOD LEFT HAND  Result Value Ref Range Status   Specimen Description BLOOD LEFT HAND  Final   Special Requests   Final    BOTTLES DRAWN AEROBIC ONLY Blood Culture adequate volume   Culture   Final    NO GROWTH 5 DAYS Performed at [facility], [place] [address], [place], [place] [address]    Report Status 07/27/2019 FINAL  Final  Culture, blood (routine x 2)      Status: None   Collection Time: 07/22/19  2:43 PM   Specimen: BLOOD LEFT HAND  Result Value Ref Range Status   Specimen Description BLOOD LEFT HAND  Final   Special Requests   Final    BOTTLES DRAWN AEROBIC ONLY Blood Culture adequate volume   Culture   Final    NO GROWTH 5 DAYS Performed at [facility], [place] [address] [place],  [address]    Report Status 07/27/2019  FINAL  Final  Culture, respiratory (non-expectorated)     Status: None   Collection Time: 07/22/19  4:13 PM   Specimen: Tracheal Aspirate; Respiratory  Result Value Ref Range Status   Specimen Description TRACHEAL ASPIRATE  Final   Special Requests NONE  Final   Gram Stain   Final    ABUNDANT WBC PRESENT, PREDOMINANTLY PMN ABUNDANT GRAM POSITIVE COCCI FEW GRAM POSITIVE RODS Performed at [facility], [place] [address], [place], [place] [address]    Culture MODERATE STAPHYLOCOCCUS AUREUS  Final   Report Status 07/24/2019 FINAL  Final   Organism ID, Bacteria STAPHYLOCOCCUS AUREUS  Final      Susceptibility   Staphylococcus aureus - MIC*    CIPROFLOXACIN <=0.5 SENSITIVE Sensitive     ERYTHROMYCIN >=8 RESISTANT Resistant     GENTAMICIN <=0.5 SENSITIVE Sensitive     OXACILLIN <=0.25 SENSITIVE Sensitive     TETRACYCLINE <=1 SENSITIVE Sensitive     VANCOMYCIN 1 SENSITIVE Sensitive     TRIMETH/SULFA <=10 SENSITIVE Sensitive     CLINDAMYCIN RESISTANT Resistant     RIFAMPIN <=0.5 SENSITIVE Sensitive     Inducible Clindamycin POSITIVE Resistant     * MODERATE STAPHYLOCOCCUS AUREUS  Culture, respiratory (non-expectorated)     Status: None   Collection Time: 08/04/19  9:25 AM   Specimen: Tracheal Aspirate; Respiratory  Result Value Ref Range Status   Specimen Description TRACHEAL ASPIRATE  Final   Special Requests NONE  Final   Gram Stain   Final    MODERATE WBC PRESENT,BOTH PMN AND MONONUCLEAR MODERATE GRAM POSITIVE COCCI IN CLUSTERS MODERATE GRAM NEGATIVE COCCOBACILLI RARE GRAM NEGATIVE RODS RARE  SQUAMOUS EPITHELIAL CELLS PRESENT Performed at [facility], [place] [address] [place], [place] [address]    Culture   Final    ABUNDANT STAPHYLOCOCCUS AUREUS ABUNDANT ENTEROBACTER CLOACAE    Report Status 08/06/2019 FINAL  Final   Organism ID, Bacteria STAPHYLOCOCCUS AUREUS  Final   Organism ID, Bacteria ENTEROBACTER CLOACAE  Final      Susceptibility   Enterobacter cloacae - MIC*    CEFAZOLIN >=64 RESISTANT Resistant     CEFEPIME 4 INTERMEDIATE Intermediate     CEFTAZIDIME >=64 RESISTANT Resistant     CIPROFLOXACIN <=0.25 SENSITIVE Sensitive     GENTAMICIN <=1 SENSITIVE Sensitive     IMIPENEM <=0.25 SENSITIVE Sensitive     TRIMETH/SULFA <=20 SENSITIVE Sensitive     PIP/TAZO >=128 RESISTANT Resistant     * ABUNDANT ENTEROBACTER CLOACAE   Staphylococcus aureus - MIC*    CIPROFLOXACIN <=0.5 SENSITIVE Sensitive     ERYTHROMYCIN >=8 RESISTANT Resistant     GENTAMICIN <=0.5 SENSITIVE Sensitive     OXACILLIN 0.5 SENSITIVE Sensitive     TETRACYCLINE <=1 SENSITIVE Sensitive     VANCOMYCIN 1 SENSITIVE Sensitive     TRIMETH/SULFA <=10 SENSITIVE Sensitive     CLINDAMYCIN RESISTANT Resistant     RIFAMPIN <=0.5 SENSITIVE Sensitive     Inducible Clindamycin POSITIVE Resistant     * ABUNDANT STAPHYLOCOCCUS AUREUS  Culture, blood (routine x 2)     Status: None   Collection Time: 08/04/19  9:37 AM   Specimen: BLOOD  Result Value Ref Range Status   Specimen Description BLOOD LEFT ANTECUBITAL  Final   Special Requests   Final    BOTTLES DRAWN AEROBIC AND ANAEROBIC Blood Culture results may not be optimal due to an inadequate volume of blood received in culture bottles   Culture   Final  NO GROWTH 5 DAYS Performed at [facility], [place] [address] [place], [place] [address]    Report Status 08/09/2019 FINAL  Final  Culture, blood (routine x 2)     Status: None   Collection Time: 08/04/19  9:50 AM   Specimen: BLOOD LEFT ARM  Result Value Ref Range Status   Specimen Description  BLOOD LEFT ARM  Final   Special Requests   Final    BOTTLES DRAWN AEROBIC AND ANAEROBIC Blood Culture results may not be optimal due to an inadequate volume of blood received in culture bottles   Culture   Final    NO GROWTH 5 DAYS Performed at [facility], [place] [address], [place], [place] [address]    Report Status 08/09/2019 FINAL  Final  C difficile quick scan w PCR reflex     Status: None   Collection Time: 08/04/19  1:55 PM   Specimen: STOOL  Result Value Ref Range Status   C Diff antigen NEGATIVE NEGATIVE Final   C Diff toxin NEGATIVE NEGATIVE Final   C Diff interpretation No C. difficile detected.  Final    Comment: Performed at [facility], [place] [address], [place], [place] [address]  SARS CORONAVIRUS 2 (TAT 6-24 HRS) Nasopharyngeal Nasopharyngeal Swab     Status: None   Collection Time: 08/06/19  9:53 AM   Specimen: Nasopharyngeal Swab  Result Value Ref Range Status   SARS Coronavirus 2 NEGATIVE NEGATIVE Final    Comment: (NOTE) SARS-CoV-2 target nucleic acids are NOT DETECTED. The SARS-CoV-2 RNA is generally detectable in upper and lower respiratory specimens during the acute phase of infection. Negative results do not preclude SARS-CoV-2 infection, do not rule out co-infections with other pathogens, and should not be used as the sole basis for treatment or other patient management decisions. Negative results must be combined with clinical observations, patient history, and epidemiological information. The expected result is Negative. Fact Sheet for Patients: SugarRoll.be Fact Sheet for Healthcare Providers: https://www.woods-mathews.com/ This test is not yet approved or cleared by the [place] FDA and  has been authorized for detection and/or diagnosis of SARS-CoV-2 by FDA under an Emergency Use Authorization (EUA). This EUA will remain  in effect (meaning this test can be used) for the duration of  the COVID-19 declaration under Section 56 4(b)(1) of the Act, 21 U.S.C. section 360bbb-3(b)(1), unless the authorization is terminated or revoked sooner. Performed at [facility], [place] [address], [place], [place] [address]   MRSA PCR Screening     Status: None   Collection Time: 08/08/19  8:54 AM   Specimen: Nasopharyngeal  Result Value Ref Range Status   MRSA by PCR NEGATIVE NEGATIVE Final    Comment:        The GeneXpert MRSA Assay (FDA approved for NASAL specimens only), is one component of a comprehensive MRSA colonization surveillance program. It is not intended to diagnose MRSA infection nor to guide or monitor treatment for MRSA infections. Performed at [facility], [place] [address] [place], [place] [address]   Culture, Urine     Status: None   Collection Time: 08/13/19  4:10 PM   Specimen: Urine, Catheterized  Result Value Ref Range Status   Specimen Description URINE, CATHETERIZED  Final   Special Requests NONE  Final   Culture   Final    NO GROWTH Performed at [facility], [address] [place], [place] [address]    Report  Status 08/14/2019 FINAL  Final  Culture, respiratory (non-expectorated)     Status: None   Collection Time: 08/13/19  4:10 PM   Specimen: Tracheal Aspirate; Respiratory  Result Value Ref Range Status   Specimen Description TRACHEAL ASPIRATE  Final   Special Requests NONE  Final   Gram Stain   Final    FEW WBC PRESENT, PREDOMINANTLY PMN RARE GRAM POSITIVE COCCI RARE GRAM POSITIVE RODS Performed at [facility], [place] [address], [place], [place] [address]    Culture   Final    FEW ENTEROBACTER CLOACAE FEW STAPHYLOCOCCUS AUREUS    Report Status 08/16/2019 FINAL  Final   Organism ID, Bacteria STAPHYLOCOCCUS AUREUS  Final   Organism ID, Bacteria ENTEROBACTER CLOACAE  Final      Susceptibility   Enterobacter cloacae - MIC*    CEFAZOLIN >=64 RESISTANT Resistant     CEFEPIME 8 INTERMEDIATE Intermediate      CEFTAZIDIME >=64 RESISTANT Resistant     CIPROFLOXACIN <=0.25 SENSITIVE Sensitive     GENTAMICIN <=1 SENSITIVE Sensitive     IMIPENEM <=0.25 SENSITIVE Sensitive     TRIMETH/SULFA <=20 SENSITIVE Sensitive     PIP/TAZO >=128 RESISTANT Resistant     * FEW ENTEROBACTER CLOACAE   Staphylococcus aureus - MIC*    CIPROFLOXACIN <=0.5 SENSITIVE Sensitive     ERYTHROMYCIN >=8 RESISTANT Resistant     GENTAMICIN <=0.5 SENSITIVE Sensitive     OXACILLIN <=0.25 SENSITIVE Sensitive     TETRACYCLINE <=1 SENSITIVE Sensitive     VANCOMYCIN <=0.5 SENSITIVE Sensitive     TRIMETH/SULFA <=10 SENSITIVE Sensitive     CLINDAMYCIN RESISTANT Resistant     RIFAMPIN <=0.5 SENSITIVE Sensitive     Inducible Clindamycin POSITIVE Resistant     * FEW STAPHYLOCOCCUS AUREUS  Culture, blood (routine x 2)     Status: None   Collection Time: 08/13/19  4:31 PM   Specimen: BLOOD LEFT ARM  Result Value Ref Range Status   Specimen Description BLOOD LEFT ARM  Final   Special Requests   Final    BOTTLES DRAWN AEROBIC ONLY Blood Culture adequate volume   Culture   Final    NO GROWTH 5 DAYS Performed at [facility], [address] [place], [place] [address]    Report Status 08/18/2019 FINAL  Final  Culture, blood (routine x 2)     Status: None   Collection Time: 08/13/19  4:31 PM   Specimen: BLOOD LEFT ARM  Result Value Ref Range Status   Specimen Description BLOOD LEFT ARM  Final   Special Requests   Final    BOTTLES DRAWN AEROBIC ONLY Blood Culture adequate volume   Culture   Final    NO GROWTH 5 DAYS Performed at [facility], [place] [address], [place], [place] [address]    Report Status 08/18/2019 FINAL  Final    Anti-infectives:  Anti-infectives (From admission, onward)   Start     Dose/Rate Route Frequency Ordered Stop   08/06/19 1000  sulfamethoxazole-trimethoprim (BACTRIM) 200-40 MG/5ML suspension 20 mL     20 mL Per Tube Every 12 hours 08/06/19 0848 08/19/19 2231   08/04/19 1400  ceFEPIme  (MAXIPIME) 2 g in sodium chloride 0.9 % 100 mL IVPB  Status:  Discontinued     2 g 200 mL/hr over 30 Minutes Intravenous Every 8 hours 08/04/19 1354 08/06/19 0941   07/24/19 1200  cefTRIAXone (ROCEPHIN) 2 g in sodium chloride 0.9 % 100 mL IVPB  2 g 200 mL/hr over 30 Minutes Intravenous Every 24 hours 07/24/19 1100 07/31/19 1138   07/23/19 0400  vancomycin (VANCOREADY) IVPB 1750 mg/350 mL  Status:  Discontinued     1,750 mg 175 mL/hr over 120 Minutes Intravenous Every 12 hours 07/22/19 1552 07/24/19 1100   07/22/19 1600  vancomycin (VANCOCIN) 2,500 mg in sodium chloride 0.9 % 500 mL IVPB     2,500 mg 250 mL/hr over 120 Minutes Intravenous  Once 07/22/19 1552 07/22/19 2214   07/22/19 1600  ceFEPIme (MAXIPIME) 2 g in sodium chloride 0.9 % 100 mL IVPB  Status:  Discontinued     2 g 200 mL/hr over 30 Minutes Intravenous Every 8 hours 07/22/19 1552 07/24/19 1100   07/05/19 2200  vancomycin (VANCOREADY) IVPB 1750 mg/350 mL  Status:  Discontinued     1,750 mg 175 mL/hr over 120 Minutes Intravenous Every 8 hours 07/05/19 1358 07/08/19 1039   07/05/19 1630  meropenem (MERREM) 1 g in sodium chloride 0.9 % 100 mL IVPB     1 g 200 mL/hr over 30 Minutes Intravenous Every 8 hours 07/05/19 1609 07/11/19 1816   07/05/19 1400  vancomycin (VANCOREADY) IVPB 2000 mg/400 mL     2,000 mg 200 mL/hr over 120 Minutes Intravenous  Once 07/05/19 1358 07/05/19 1644   07/14/2019 0600  ceFAZolin (ANCEF) IVPB 2g/100 mL premix  Status:  Discontinued     2 g 200 mL/hr over 30 Minutes Intravenous Every 8 hours 07/03/19 1201 07/05/19 1609   07/02/19 0845  levofloxacin (LEVAQUIN) IVPB 750 mg  Status:  Discontinued     750 mg 100 mL/hr over 90 Minutes Intravenous Every 24 hours 07/02/19 0831 07/03/19 1201   06/29/2019 1732  bacitracin 50,000 Units in sodium chloride 0.9 % 500 mL irrigation  Status:  Discontinued       As needed 06/22/2019 1733 06/30/2019 1828   06/25/19 0400  vancomycin (VANCOREADY) IVPB 1500 mg/300 mL      1,500 mg 150 mL/hr over 120 Minutes Intravenous Every 12 hours 06/25/19 0121 06/25/19 1811   06/26/2019 2228  bacitracin 50,000 Units in sodium chloride 0.9 % 500 mL irrigation  Status:  Discontinued       As needed 07/04/2019 2229 06/25/19 0005   07/01/2019 2000  ceFAZolin (ANCEF) 3 g in dextrose 5 % 50 mL IVPB     3 g 100 mL/hr over 30 Minutes Intravenous  Once 07/12/2019 1947 07/06/2019 2031      Best Practice/Protocols:  VTE Prophylaxis: Lovenox (prophylaxtic dose) Continous Sedation  Consults:     Studies:    Events:  Subjective:    Overnight Issues:   Objective:  Vital signs for last 24 hours: Temp:  [98.3 F (36.8 C)-106.1 F (41.2 C)] 98.3 F (36.8 C) (03/02 0400) Pulse Rate:  [55-156] 93 (03/02 0600) Resp:  [11-40] 19 (03/02 0600) BP: (87-218)/(32-167) 114/90 (03/02 0600) SpO2:  [95 %-100 %] 100 % (03/02 0600) FiO2 (%):  [40 %-60 %] 50 % (03/02 0316) Weight:  [109.9 kg] 109.9 kg (03/02 0500)  Hemodynamic parameters for last 24 hours:    Intake/Output from previous day: 03/01 0701 - 03/02 0700 In: 3234.9 [I.V.:1044.9; NG/GT:1690; IV Piggyback:500] Out: 2150 [Urine:2150]  Intake/Output this shift: No intake/output data recorded.  Vent settings for last 24 hours: Vent Mode: PRVC FiO2 (%):  [40 %-60 %] 50 % Set Rate:  [14 bmp] 14 bmp Vt Set:  [390 mL] 390 mL PEEP:  [5 cmH20] 5 cmH20 Plateau Pressure:  [9  cmH20-12 cmH20] 12 cmH20  Physical Exam:  General: no respiratory distress Neuro: pupils 41mm, postures HEENT/Neck: trach with wound Resp: clear to auscultation bilaterally CVS: RRR GI: soft, NT, PEG in place Extremities: ne edema  Results for orders placed or performed during the hospital encounter of 07/02/2019 (from the past 24 hour(s))  Glucose, capillary     Status: None   Collection Time: 08/19/19  7:58 AM  Result Value Ref Range   Glucose-Capillary 96 70 - 99 mg/dL   Comment 1 Notify RN    Comment 2 Document in Chart   Glucose, capillary      Status: Abnormal   Collection Time: 08/19/19 11:48 AM  Result Value Ref Range   Glucose-Capillary 142 (H) 70 - 99 mg/dL   Comment 1 Notify RN    Comment 2 Document in Chart   Glucose, capillary     Status: Abnormal   Collection Time: 08/19/19  4:10 PM  Result Value Ref Range   Glucose-Capillary 142 (H) 70 - 99 mg/dL   Comment 1 Notify RN    Comment 2 Document in Chart   Glucose, capillary     Status: Abnormal   Collection Time: 08/19/19  7:32 PM  Result Value Ref Range   Glucose-Capillary 143 (H) 70 - 99 mg/dL  Glucose, capillary     Status: Abnormal   Collection Time: 08/19/19 11:17 PM  Result Value Ref Range   Glucose-Capillary 180 (H) 70 - 99 mg/dL  Glucose, capillary     Status: Abnormal   Collection Time: 08/20/19  3:31 AM  Result Value Ref Range   Glucose-Capillary 140 (H) 70 - 99 mg/dL  CBC     Status: Abnormal   Collection Time: 08/20/19  6:03 AM  Result Value Ref Range   WBC 6.0 4.0 - 10.5 K/uL   RBC 2.83 (L) 4.22 - 5.81 MIL/uL   Hemoglobin 7.9 (L) 13.0 - 17.0 g/dL   HCT 27.9 (L) 39.0 - 52.0 %   MCV 98.6 80.0 - 100.0 fL   MCH 27.9 26.0 - 34.0 pg   MCHC 28.3 (L) 30.0 - 36.0 g/dL   RDW 16.9 (H) 11.5 - 15.5 %   Platelets 128 (L) 150 - 400 K/uL   nRBC 0.0 0.0 - 0.2 %  Basic metabolic panel     Status: Abnormal   Collection Time: 08/20/19  6:03 AM  Result Value Ref Range   Sodium 154 (H) 135 - 145 mmol/L   Potassium 3.7 3.5 - 5.1 mmol/L   Chloride 126 (H) 98 - 111 mmol/L   CO2 22 22 - 32 mmol/L   Glucose, Bld 95 70 - 99 mg/dL   BUN 31 (H) 6 - 20 mg/dL   Creatinine, Ser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

## 2019-08-21 ENCOUNTER — Inpatient Hospital Stay (HOSPITAL_COMMUNITY): Payer: Medicaid - Out of State

## 2019-08-21 LAB — GLUCOSE, CAPILLARY
Glucose-Capillary: 106 mg/dL — ABNORMAL HIGH (ref 70–99)
Glucose-Capillary: 107 mg/dL — ABNORMAL HIGH (ref 70–99)
Glucose-Capillary: 112 mg/dL — ABNORMAL HIGH (ref 70–99)
Glucose-Capillary: 116 mg/dL — ABNORMAL HIGH (ref 70–99)
Glucose-Capillary: 134 mg/dL — ABNORMAL HIGH (ref 70–99)
Glucose-Capillary: 98 mg/dL (ref 70–99)

## 2019-08-21 LAB — BASIC METABOLIC PANEL
Anion gap: 8 (ref 5–15)
BUN: 24 mg/dL — ABNORMAL HIGH (ref 6–20)
CO2: 25 mmol/L (ref 22–32)
Calcium: 8.5 mg/dL — ABNORMAL LOW (ref 8.9–10.3)
Chloride: 120 mmol/L — ABNORMAL HIGH (ref 98–111)
Creatinine, Ser: 0.6 mg/dL — ABNORMAL LOW (ref 0.61–1.24)
GFR calc Af Amer: >60 mL/min (ref >60)
GFR calc non Af Amer: >60 mL/min (ref >60)
Glucose, Bld: 101 mg/dL — ABNORMAL HIGH (ref 70–99)
Potassium: 3.8 mmol/L (ref 3.5–5.1)
Sodium: 153 mmol/L — ABNORMAL HIGH (ref 135–145)

## 2019-08-21 MED ORDER — VECURONIUM BROMIDE 10 MG IV SOLR
10.0000 mg | Freq: Once | INTRAVENOUS | Status: AC
  Administered 2019-08-21: 10 mg via INTRAVENOUS
  Filled 2019-08-21: qty 10

## 2019-08-21 MED ORDER — MORPHINE SULFATE (PF) 4 MG/ML IV SOLN
8.0000 mg | INTRAVENOUS | Status: DC | PRN
  Administered 2019-08-21 – 2019-08-22 (×2): 8 mg via INTRAVENOUS
  Filled 2019-08-21 (×3): qty 2

## 2019-08-21 MED ORDER — FREE WATER
300.0000 mL | Freq: Four times a day (QID) | Status: DC
  Administered 2019-08-21 – 2019-08-23 (×8): 300 mL

## 2019-08-21 NOTE — Progress Notes (Signed)
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 emergently evacuated on 1/8 with concomitant debridement of devitalized brain, poor GCS despite this and poor prognosis per NSGY. Family meeting 1/12 to discuss [place] and family would like to pursue maximal therapies. On propranolol (max dose), clonidine (max dose), ativan, seroquel, bromocriptine for neuro storming. MRI reviewed by Dr. 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 Continue to expect a poor prognosis for meaningful recovery. Discussion with parents regarding MRI results and prognosis by both Dr. Grandville Silos and Dr. Ellene Route 2/9, mother continues to desire aggressive care. Continued conversations with mother regarding clinical status and lack of improvement by primary trauma team.  Continued intermittant prolonged neuro storming - discussed with pharmacy - will add morphine 8mg  q3h PRN  Acute hypoxic ventilator dependent respiratory failure with severe ARDS - trach 1/22, PSV trial set off hours of storming - hold off Multiple abrasions -  local wound care, ensure adequate pressure off-loading of scalp over crani site  L1 TVP FX - pain control.   HTN - Propranolol and clonidine for neuro storming  Urinary retention - bethanechol FEN - TF to continue, increase free water for hypernatremia ID - completed Bactrim for enterobacter and persistent OSSA PNA 3/2, WBC has been WNL - recheck in AM. Fevers are central. VTE - SCDs, LMWH  Dispo - ICU, TOC team working on placement in [place] (he has [place] Medicaid). Info sent to another facility [facility]).  Very poor prognosis, especially as storming episodes get longer. I D/W Dr. Ellene Route again yesterday and he spoke with Srihaan's mother. Critical Care Total Time*: 34 Minutes  Georganna Skeans, MD, MPH, FACS Trauma & General Surgery Use [email or web address] to contact on call provider  08/21/2019  *Care during the described time interval was provided by me. I have reviewed this patient's available data, including medical history, events of note, physical examination and test results as part of my evaluation.

## 2019-08-21 NOTE — Progress Notes (Signed)
PICC nurse came this morning to assess the pt's PICC line. She found that it is out 2 cm from original insertion. She felt like it was still safe to use given he is not receiving vasoactive medications. A chest xray was then performed to confirm the actual location but there is nothing written in the radiology report about location.

## 2019-08-22 LAB — GLUCOSE, CAPILLARY
Glucose-Capillary: 107 mg/dL — ABNORMAL HIGH (ref 70–99)
Glucose-Capillary: 125 mg/dL — ABNORMAL HIGH (ref 70–99)
Glucose-Capillary: 113 mg/dL — ABNORMAL HIGH (ref 70–99)
Glucose-Capillary: 117 mg/dL — ABNORMAL HIGH (ref 70–99)
Glucose-Capillary: 111 mg/dL — ABNORMAL HIGH (ref 70–99)
Glucose-Capillary: 109 mg/dL — ABNORMAL HIGH (ref 70–99)
Glucose-Capillary: 115 mg/dL — ABNORMAL HIGH (ref 70–99)

## 2019-08-22 LAB — CBC
HCT: 26.7 % — ABNORMAL LOW (ref 39.0–52.0)
Hemoglobin: 7.6 g/dL — ABNORMAL LOW (ref 13.0–17.0)
MCH: 27.7 pg (ref 26.0–34.0)
MCHC: 28.5 g/dL — ABNORMAL LOW (ref 30.0–36.0)
MCV: 97.4 fL (ref 80.0–100.0)
Platelets: 148 10*3/uL — ABNORMAL LOW (ref 150–400)
RBC: 2.74 MIL/uL — ABNORMAL LOW (ref 4.22–5.81)
RDW: 16.2 % — ABNORMAL HIGH (ref 11.5–15.5)
WBC: 7.8 10*3/uL (ref 4.0–10.5)
nRBC: 0 % (ref 0.0–0.2)

## 2019-08-22 LAB — BASIC METABOLIC PANEL
Anion gap: 8 (ref 5–15)
BUN: 23 mg/dL — ABNORMAL HIGH (ref 6–20)
CO2: 25 mmol/L (ref 22–32)
Calcium: 8.5 mg/dL — ABNORMAL LOW (ref 8.9–10.3)
Chloride: 120 mmol/L — ABNORMAL HIGH (ref 98–111)
Creatinine, Ser: 0.57 mg/dL — ABNORMAL LOW (ref 0.61–1.24)
GFR calc Af Amer: >60 mL/min (ref >60)
GFR calc non Af Amer: >60 mL/min (ref >60)
Glucose, Bld: 123 mg/dL — ABNORMAL HIGH (ref 70–99)
Potassium: 3.6 mmol/L (ref 3.5–5.1)
Sodium: 153 mmol/L — ABNORMAL HIGH (ref 135–145)

## 2019-08-22 LAB — TRIGLYCERIDES
Triglycerides: 1788 mg/dL — ABNORMAL HIGH (ref <150)
Triglycerides: 354 mg/dL — ABNORMAL HIGH (ref <150)

## 2019-08-22 MED ORDER — PENTOBARBITAL SODIUM 50 MG/ML IJ SOLN
100.0000 mg | Freq: Four times a day (QID) | INTRAVENOUS | Status: DC | PRN
  Administered 2019-09-01: 100 mg via INTRAVENOUS
  Filled 2019-08-22 (×3): qty 2

## 2019-08-22 MED ORDER — PROPOFOL 1000 MG/100ML IV EMUL
5.0000 ug/kg/min | INTRAVENOUS | Status: DC
  Administered 2019-08-22 (×2): 50 ug/kg/min via INTRAVENOUS
  Administered 2019-08-22: 40 ug/kg/min via INTRAVENOUS
  Administered 2019-08-22 (×2): 50 ug/kg/min via INTRAVENOUS
  Administered 2019-08-23: 30 ug/kg/min via INTRAVENOUS
  Administered 2019-08-23 (×2): 40 ug/kg/min via INTRAVENOUS
  Administered 2019-08-23: 60 ug/kg/min via INTRAVENOUS
  Administered 2019-08-24 – 2019-08-25 (×5): 30 ug/kg/min via INTRAVENOUS
  Administered 2019-08-25: 15:00:00 35 ug/kg/min via INTRAVENOUS
  Administered 2019-08-25 (×2): 30 ug/kg/min via INTRAVENOUS
  Administered 2019-08-26 (×4): 40 ug/kg/min via INTRAVENOUS
  Administered 2019-08-26: 30 ug/kg/min via INTRAVENOUS
  Administered 2019-08-27 (×2): 20 ug/kg/min via INTRAVENOUS
  Administered 2019-08-27: 15 ug/kg/min via INTRAVENOUS
  Administered 2019-08-27: 30 ug/kg/min via INTRAVENOUS
  Administered 2019-08-28: 25 ug/kg/min via INTRAVENOUS
  Administered 2019-08-28 (×2): 20 ug/kg/min via INTRAVENOUS
  Administered 2019-08-29 (×2): 25 ug/kg/min via INTRAVENOUS
  Administered 2019-08-29 – 2019-09-01 (×7): 15 ug/kg/min via INTRAVENOUS
  Administered 2019-09-01 (×2): 25 ug/kg/min via INTRAVENOUS
  Administered 2019-09-01: 50 ug/kg/min via INTRAVENOUS
  Administered 2019-09-02: 18:00:00 20 ug/kg/min via INTRAVENOUS
  Administered 2019-09-02: 25 ug/kg/min via INTRAVENOUS
  Administered 2019-09-03 (×3): 20 ug/kg/min via INTRAVENOUS
  Administered 2019-09-04: 09:00:00 15 ug/kg/min via INTRAVENOUS
  Administered 2019-09-04: 20 ug/kg/min via INTRAVENOUS
  Administered 2019-09-04: 25 ug/kg/min via INTRAVENOUS
  Administered 2019-09-04: 50 ug/kg/min via INTRAVENOUS
  Administered 2019-09-05: 20 ug/kg/min via INTRAVENOUS
  Administered 2019-09-05: 40 ug/kg/min via INTRAVENOUS
  Administered 2019-09-05 – 2019-09-06 (×2): 20 ug/kg/min via INTRAVENOUS
  Administered 2019-09-06: 25 ug/kg/min via INTRAVENOUS
  Administered 2019-09-06: 30 ug/kg/min via INTRAVENOUS
  Filled 2019-08-22 (×66): qty 100

## 2019-08-22 MED ORDER — MORPHINE SULFATE (PF) 4 MG/ML IV SOLN
10.0000 mg | INTRAVENOUS | Status: DC | PRN
  Administered 2019-08-22: 8 mg via INTRAVENOUS
  Administered 2019-08-26: 10 mg via INTRAVENOUS
  Filled 2019-08-22: qty 3

## 2019-08-22 MED ORDER — PENTOBARBITAL SODIUM 50 MG/ML IJ SOLN
100.0000 mg | INTRAVENOUS | Status: DC | PRN
  Filled 2019-08-22: qty 2

## 2019-08-22 NOTE — Progress Notes (Signed)
Patient ID: Jay Cole, male   DOB: November 29, 1995, 24 y.o.   MRN: [id number] Neuro storming continues to be an issue I have discussed this with several of my partners and in previous experience occasional severe neuro storming or hypothalamic fits could be broken with doses of pentobarbital, rarely a barbiturate coma would be induced for a period of time. I believe it would be reasonable to try some doses of pentobarbital at 100 mg at the start of a neuro storm to see if this helps to break or contro the severity of it.  I have discussed this with Dr. Grandville Silos today and we will see how he responds to this intervention.
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
## 2019-08-22 NOTE — Progress Notes (Signed)
Patient ID: Jay Cole, male   DOB: 27-Nov-1995, 24 y.o.   MRN: [id number] I D/W Dr. Ellene Route at the bedside. He recommends pentobarbital 100mg  PRN for neuro storm. The propofol has helped but if he has further storming will try the pentobarb.  Georganna Skeans, MD, MPH, FACS Please use [email or web address] to contact on call provider

## 2019-08-23 LAB — GLUCOSE, CAPILLARY
Glucose-Capillary: 105 mg/dL — ABNORMAL HIGH (ref 70–99)
Glucose-Capillary: 108 mg/dL — ABNORMAL HIGH (ref 70–99)
Glucose-Capillary: 117 mg/dL — ABNORMAL HIGH (ref 70–99)
Glucose-Capillary: 110 mg/dL — ABNORMAL HIGH (ref 70–99)
Glucose-Capillary: 115 mg/dL — ABNORMAL HIGH (ref 70–99)
Glucose-Capillary: 125 mg/dL — ABNORMAL HIGH (ref 70–99)

## 2019-08-23 LAB — BASIC METABOLIC PANEL
Anion gap: 14 (ref 5–15)
BUN: 15 mg/dL (ref 6–20)
CO2: 20 mmol/L — ABNORMAL LOW (ref 22–32)
Calcium: 8.2 mg/dL — ABNORMAL LOW (ref 8.9–10.3)
Chloride: 107 mmol/L (ref 98–111)
Creatinine, Ser: 0.49 mg/dL — ABNORMAL LOW (ref 0.61–1.24)
GFR calc Af Amer: >60 mL/min (ref >60)
GFR calc non Af Amer: >60 mL/min (ref >60)
Glucose, Bld: 100 mg/dL — ABNORMAL HIGH (ref 70–99)
Potassium: 3.7 mmol/L (ref 3.5–5.1)
Sodium: 141 mmol/L (ref 135–145)

## 2019-08-23 NOTE — Progress Notes (Signed)
Patient ID: Jay Cole, male   DOB: Oct 24, 1995, 24 y.o.   MRN: [id number] Vital signs are stable neuro storming seems somewhat better controlled on propofol.  Discussed the use of pentobarbital but this has not been used at this time.  We will continue to follow along .

## 2019-08-23 NOTE — Progress Notes (Signed)
Patient ID: Jay Cole, male   DOB: 1996/04/07, 24 y.o.   MRN: [id number] Follow up - Trauma Critical Care  Patient Details:    Jay Cole is an 24 y.o. male.  Lines/tubes : PICC Double Lumen 06/26/19 PICC Right Brachial 40 cm 0 cm (Active)  Indication for Insertion or Continuance of Line Prolonged intravenous therapies 08/23/19 0800  Exposed Catheter (cm) 0 cm 06/26/19 2000  Site Assessment Dry;Clean;Intact 08/23/19 0800  Lumen #1 Status Infusing;In-line blood sampling system in place 08/23/19 0800  Lumen #2 Status Infusing 08/23/19 0800  Dressing Type Transparent;Occlusive 08/23/19 0800  Dressing Status Clean;Intact;Dry;Antimicrobial disc in place 08/23/19 0800  Line Care Leveled;Zeroed and calibrated 08/22/19 0800  Line Adjustment (NICU/IV Team Only) No 08/13/19 0900  Dressing Intervention Dressing changed;Antimicrobial disc changed;Securement device changed 08/21/19 0600  Dressing Change Due 08/28/19 08/23/19 0800     Gastrostomy/Enterostomy Percutaneous endoscopic gastrostomy (PEG) 24 Fr. (Active)  Surrounding Skin Dry;Intact 08/23/19 0800  Tube Status Patent 08/23/19 0800  Drainage Appearance None 08/23/19 0800  Catheter Position (cm marking) 7 cm 08/11/19 2000  Dressing Status Clean;Dry;Intact 08/23/19 0800  Dressing Intervention Dressing changed 08/22/19 2000  Dressing Type Split gauze 08/23/19 0800  Dressing Change Due 08/16/19 08/14/19 0800  G Port Intake (mL) 15 ml 08/18/19 1800  J Port Intake (mL) 100 ml 08/12/19 1800  Output (mL) 0 mL 08/05/19 2000     Rectal Tube/Pouch (Active)  Output (mL) 150 mL 08/23/19 0600     External Urinary Catheter (Active)  Collection Container Standard drainage bag 08/23/19 0800  Securement Method Securing device (Describe) 08/23/19 0800  Site Assessment Clean;Intact 08/23/19 0800  Intervention Equipment Changed 08/20/19 0600  Output (mL) 450 mL 08/23/19 0600    Microbiology/Sepsis markers: Results for orders placed or  performed during the hospital encounter of 07/06/2019  Respiratory Panel by RT PCR (Flu A&B, Covid) - Nasopharyngeal Swab     Status: None   Collection Time: 06/26/2019  8:36 PM   Specimen: Nasopharyngeal Swab  Result Value Ref Range Status   SARS Coronavirus 2 by RT PCR NEGATIVE NEGATIVE Final    Comment: (NOTE) SARS-CoV-2 target nucleic acids are NOT DETECTED. The SARS-CoV-2 RNA is generally detectable in upper respiratoy specimens during the acute phase of infection. The lowest concentration of SARS-CoV-2 viral copies this assay can detect is 131 copies/mL. A negative result does not preclude SARS-Cov-2 infection and should not be used as the sole basis for treatment or other patient management decisions. A negative result may occur with  improper specimen collection/handling, submission of specimen other than nasopharyngeal swab, presence of viral mutation(s) within the areas targeted by this assay, and inadequate number of viral copies (<131 copies/mL). A negative result must be combined with clinical observations, patient history, and epidemiological information. The expected result is Negative. Fact Sheet for Patients:  PinkCheek.be Fact Sheet for Healthcare Providers:  GravelBags.it This test is not yet ap proved or cleared by the [place] FDA and  has been authorized for detection and/or diagnosis of SARS-CoV-2 by FDA under an Emergency Use Authorization (EUA). This EUA will remain  in effect (meaning this test can be used) for the duration of the COVID-19 declaration under Section 564(b)(1) of the Act, 21 U.S.C. section 360bbb-3(b)(1), unless the authorization is terminated or revoked sooner.    Influenza A by PCR NEGATIVE NEGATIVE Final   Influenza B by PCR NEGATIVE NEGATIVE Final    Comment: (NOTE) The Xpert Xpress SARS-CoV-2/FLU/RSV assay is intended as an aid  in  the diagnosis of influenza from Nasopharyngeal  swab specimens and  should not be used as a sole basis for treatment. Nasal washings and  aspirates are unacceptable for Xpert Xpress SARS-CoV-2/FLU/RSV  testing. Fact Sheet for Patients: PinkCheek.be Fact Sheet for Healthcare Providers: GravelBags.it This test is not yet approved or cleared by the [place] FDA and  has been authorized for detection and/or diagnosis of SARS-CoV-2 by  FDA under an Emergency Use Authorization (EUA). This EUA will remain  in effect (meaning this test can be used) for the duration of the  Covid-19 declaration under Section 564(b)(1) of the Act, 21  U.S.C. section 360bbb-3(b)(1), unless the authorization is  terminated or revoked. Performed at [facility], [place] [address] [place], [place] [address]   MRSA PCR Screening     Status: None   Collection Time: 06/25/19 12:50 AM   Specimen: Nasal Mucosa; Nasopharyngeal  Result Value Ref Range Status   MRSA by PCR NEGATIVE NEGATIVE Final    Comment:        The GeneXpert MRSA Assay (FDA approved for NASAL specimens only), is one component of a comprehensive MRSA colonization surveillance program. It is not intended to diagnose MRSA infection nor to guide or monitor treatment for MRSA infections. Performed at [facility], [place] [address] [place], [place] [address]   Culture, respiratory (non-expectorated)     Status: None   Collection Time: 07/01/19  7:11 AM   Specimen: Tracheal Aspirate; Respiratory  Result Value Ref Range Status   Specimen Description TRACHEAL ASPIRATE  Final   Special Requests NONE  Final   Gram Stain   Final    FEW WBC PRESENT, PREDOMINANTLY PMN FEW GRAM POSITIVE COCCI IN PAIRS FEW GRAM POSITIVE RODS Performed at [facility], [place] [address], [place], [place] [address]    Culture ABUNDANT STAPHYLOCOCCUS AUREUS  Final   Report Status 07/09/2019 FINAL  Final   Organism ID, Bacteria STAPHYLOCOCCUS  AUREUS  Final      Susceptibility   Staphylococcus aureus - MIC*    CIPROFLOXACIN <=0.5 SENSITIVE Sensitive     ERYTHROMYCIN RESISTANT Resistant     GENTAMICIN <=0.5 SENSITIVE Sensitive     OXACILLIN 0.5 SENSITIVE Sensitive     TETRACYCLINE <=1 SENSITIVE Sensitive     VANCOMYCIN <=0.5 SENSITIVE Sensitive     TRIMETH/SULFA <=10 SENSITIVE Sensitive     CLINDAMYCIN RESISTANT Resistant     RIFAMPIN <=0.5 SENSITIVE Sensitive     Inducible Clindamycin POSITIVE Resistant     * ABUNDANT STAPHYLOCOCCUS AUREUS  Culture, blood (routine x 2)     Status: None   Collection Time: 07/01/19  8:45 AM   Specimen: BLOOD  Result Value Ref Range Status   Specimen Description BLOOD LEFT ANTECUBITAL  Final   Special Requests AEROBIC BOTTLE ONLY Blood Culture adequate volume  Final   Culture   Final    NO GROWTH 5 DAYS Performed at [facility], [place] [address] [place],  [address]    Report Status 07/06/2019 FINAL  Final  Culture, blood (routine x 2)     Status: None   Collection Time: 07/01/19  8:52 AM   Specimen: BLOOD  Result Value Ref Range Status   Specimen Description BLOOD LEFT ANTECUBITAL  Final   Special Requests AEROBIC BOTTLE ONLY Blood Culture adequate volume  Final   Culture   Final    NO GROWTH 5 DAYS Performed at [place]  [address] [place], [place] [address]    Report Status 07/06/2019 FINAL  Final  Culture, respiratory (non-expectorated)     Status: None   Collection Time: 07/05/19  9:32 AM   Specimen: Tracheal Aspirate; Respiratory  Result Value Ref Range Status   Specimen Description TRACHEAL ASPIRATE  Final   Special Requests NONE  Final   Gram Stain   Final    MODERATE WBC PRESENT,BOTH PMN AND MONONUCLEAR RARE GRAM POSITIVE COCCI FEW GRAM VARIABLE ROD Performed at [facility], [place] [address] [place], [place] [address]    Culture RARE STAPHYLOCOCCUS AUREUS  Final   Report Status 07/08/2019 FINAL  Final   Organism ID, Bacteria  STAPHYLOCOCCUS AUREUS  Final      Susceptibility   Staphylococcus aureus - MIC*    CIPROFLOXACIN <=0.5 SENSITIVE Sensitive     ERYTHROMYCIN RESISTANT Resistant     GENTAMICIN <=0.5 SENSITIVE Sensitive     OXACILLIN 0.5 SENSITIVE Sensitive     TETRACYCLINE <=1 SENSITIVE Sensitive     VANCOMYCIN 1 SENSITIVE Sensitive     TRIMETH/SULFA <=10 SENSITIVE Sensitive     CLINDAMYCIN RESISTANT Resistant     RIFAMPIN <=0.5 SENSITIVE Sensitive     Inducible Clindamycin POSITIVE Resistant     * RARE STAPHYLOCOCCUS AUREUS  Culture, blood (routine x 2)     Status: None   Collection Time: 07/05/19 12:00 PM   Specimen: BLOOD  Result Value Ref Range Status   Specimen Description BLOOD LEFT ANTECUBITAL  Final   Special Requests   Final    BOTTLES DRAWN AEROBIC ONLY Blood Culture adequate volume   Culture   Final    NO GROWTH 5 DAYS Performed at [facility], [address], [place], [place] [address]    Report Status 07/10/2019 FINAL  Final  Culture, blood (routine x 2)     Status: None   Collection Time: 07/05/19 12:23 PM   Specimen: BLOOD  Result Value Ref Range Status   Specimen Description BLOOD LEFT ANTECUBITAL  Final   Special Requests   Final    BOTTLES DRAWN AEROBIC ONLY Blood Culture adequate volume   Culture   Final    NO GROWTH 5 DAYS Performed at [facility], [place] [address], [place], [place] [address]    Report Status 07/10/2019 FINAL  Final  Culture, blood (routine x 2)     Status: None   Collection Time: 07/22/19  2:42 PM   Specimen: BLOOD LEFT HAND  Result Value Ref Range Status   Specimen Description BLOOD LEFT HAND  Final   Special Requests   Final    BOTTLES DRAWN AEROBIC ONLY Blood Culture adequate volume   Culture   Final    NO GROWTH 5 DAYS Performed at [facility], [place] [address] [place], [place] [address]    Report Status 07/27/2019 FINAL  Final  Culture, blood (routine x 2)     Status: None   Collection Time: 07/22/19  2:43 PM   Specimen:  BLOOD LEFT HAND  Result Value Ref Range Status   Specimen Description BLOOD LEFT HAND  Final   Special Requests   Final    BOTTLES DRAWN AEROBIC ONLY Blood Culture adequate volume   Culture   Final    NO GROWTH 5 DAYS Performed at [facility], [place] [address], [place], [place] [address]    Report Status 07/27/2019 FINAL  Final  Culture, respiratory (non-expectorated)     Status:  None   Collection Time: 07/22/19  4:13 PM   Specimen: Tracheal Aspirate; Respiratory  Result Value Ref Range Status   Specimen Description TRACHEAL ASPIRATE  Final   Special Requests NONE  Final   Gram Stain   Final    ABUNDANT WBC PRESENT, PREDOMINANTLY PMN ABUNDANT GRAM POSITIVE COCCI FEW GRAM POSITIVE RODS Performed at [facility], [place] [address] [place], [place] [address]    Culture MODERATE STAPHYLOCOCCUS AUREUS  Final   Report Status 07/24/2019 FINAL  Final   Organism ID, Bacteria STAPHYLOCOCCUS AUREUS  Final      Susceptibility   Staphylococcus aureus - MIC*    CIPROFLOXACIN <=0.5 SENSITIVE Sensitive     ERYTHROMYCIN >=8 RESISTANT Resistant     GENTAMICIN <=0.5 SENSITIVE Sensitive     OXACILLIN <=0.25 SENSITIVE Sensitive     TETRACYCLINE <=1 SENSITIVE Sensitive     VANCOMYCIN 1 SENSITIVE Sensitive     TRIMETH/SULFA <=10 SENSITIVE Sensitive     CLINDAMYCIN RESISTANT Resistant     RIFAMPIN <=0.5 SENSITIVE Sensitive     Inducible Clindamycin POSITIVE Resistant     * MODERATE STAPHYLOCOCCUS AUREUS  Culture, respiratory (non-expectorated)     Status: None   Collection Time: 08/04/19  9:25 AM   Specimen: Tracheal Aspirate; Respiratory  Result Value Ref Range Status   Specimen Description TRACHEAL ASPIRATE  Final   Special Requests NONE  Final   Gram Stain   Final    MODERATE WBC PRESENT,BOTH PMN AND MONONUCLEAR MODERATE GRAM POSITIVE COCCI IN CLUSTERS MODERATE GRAM NEGATIVE COCCOBACILLI RARE GRAM NEGATIVE RODS RARE SQUAMOUS EPITHELIAL CELLS PRESENT Performed at [facility], [place] [address], [place], [place] [address]    Culture   Final    ABUNDANT STAPHYLOCOCCUS AUREUS ABUNDANT ENTEROBACTER CLOACAE    Report Status 08/06/2019 FINAL  Final   Organism ID, Bacteria STAPHYLOCOCCUS AUREUS  Final   Organism ID, Bacteria ENTEROBACTER CLOACAE  Final      Susceptibility   Enterobacter cloacae - MIC*    CEFAZOLIN >=64 RESISTANT Resistant     CEFEPIME 4 INTERMEDIATE Intermediate     CEFTAZIDIME >=64 RESISTANT Resistant     CIPROFLOXACIN <=0.25 SENSITIVE Sensitive     GENTAMICIN <=1 SENSITIVE Sensitive     IMIPENEM <=0.25 SENSITIVE Sensitive     TRIMETH/SULFA <=20 SENSITIVE Sensitive     PIP/TAZO >=128 RESISTANT Resistant     * ABUNDANT ENTEROBACTER CLOACAE   Staphylococcus aureus - MIC*    CIPROFLOXACIN <=0.5 SENSITIVE Sensitive     ERYTHROMYCIN >=8 RESISTANT Resistant     GENTAMICIN <=0.5 SENSITIVE Sensitive     OXACILLIN 0.5 SENSITIVE Sensitive     TETRACYCLINE <=1 SENSITIVE Sensitive     VANCOMYCIN 1 SENSITIVE Sensitive     TRIMETH/SULFA <=10 SENSITIVE Sensitive     CLINDAMYCIN RESISTANT Resistant     RIFAMPIN <=0.5 SENSITIVE Sensitive     Inducible Clindamycin POSITIVE Resistant     * ABUNDANT STAPHYLOCOCCUS AUREUS  Culture, blood (routine x 2)     Status: None   Collection Time: 08/04/19  9:37 AM   Specimen: BLOOD  Result Value Ref Range Status   Specimen Description BLOOD LEFT ANTECUBITAL  Final   Special Requests   Final    BOTTLES DRAWN AEROBIC AND ANAEROBIC Blood Culture results may not be optimal due to an inadequate volume of blood received in culture bottles   Culture   Final    NO GROWTH 5 DAYS Performed at [facility], [address] [place], [place] [address]    Report Status 08/09/2019 FINAL  Final  Culture, blood (routine x 2)     Status: None   Collection Time: 08/04/19  9:50 AM   Specimen: BLOOD LEFT ARM  Result Value Ref Range Status   Specimen Description BLOOD LEFT ARM  Final   Special Requests   Final     BOTTLES DRAWN AEROBIC AND ANAEROBIC Blood Culture results may not be optimal due to an inadequate volume of blood received in culture bottles   Culture   Final    NO GROWTH 5 DAYS Performed at [facility], [place] [address], [place], [place] [address]    Report Status 08/09/2019 FINAL  Final  C difficile quick scan w PCR reflex     Status: None   Collection Time: 08/04/19  1:55 PM   Specimen: STOOL  Result Value Ref Range Status   C Diff antigen NEGATIVE NEGATIVE Final   C Diff toxin NEGATIVE NEGATIVE Final   C Diff interpretation No C. difficile detected.  Final    Comment: Performed at [facility], [place] [address] [place], [place] [address]  SARS CORONAVIRUS 2 (TAT 6-24 HRS) Nasopharyngeal Nasopharyngeal Swab     Status: None   Collection Time: 08/06/19  9:53 AM   Specimen: Nasopharyngeal Swab  Result Value Ref Range Status   SARS Coronavirus 2 NEGATIVE NEGATIVE Final    Comment: (NOTE) SARS-CoV-2 target nucleic acids are NOT DETECTED. The SARS-CoV-2 RNA is generally detectable in upper and lower respiratory specimens during the acute phase of infection. Negative results do not preclude SARS-CoV-2 infection, do not rule out co-infections with other pathogens, and should not be used as the sole basis for treatment or other patient management decisions. Negative results must be combined with clinical observations, patient history, and epidemiological information. The expected result is Negative. Fact Sheet for Patients: SugarRoll.be Fact Sheet for Healthcare Providers: https://www.woods-mathews.com/ This test is not yet approved or cleared by the [place] FDA and  has been authorized for detection and/or diagnosis of SARS-CoV-2 by FDA under an Emergency Use Authorization (EUA). This EUA will remain  in effect (meaning this test can be used) for the duration of the COVID-19 declaration under Section 56 4(b)(1) of the Act, 21  U.S.C. section 360bbb-3(b)(1), unless the authorization is terminated or revoked sooner. Performed at [facility], [place] [address] [place], [place] [address]   MRSA PCR Screening     Status: None   Collection Time: 08/08/19  8:54 AM   Specimen: Nasopharyngeal  Result Value Ref Range Status   MRSA by PCR NEGATIVE NEGATIVE Final    Comment:        The GeneXpert MRSA Assay (FDA approved for NASAL specimens only), is one component of a comprehensive MRSA colonization surveillance program. It is not intended to diagnose MRSA infection nor to guide or monitor treatment for MRSA infections. Performed at [facility], [place] [address], [place], [place] [address]   Culture, Urine     Status: None   Collection Time: 08/13/19  4:10 PM   Specimen: Urine, Catheterized  Result Value Ref Range Status   Specimen Description URINE, CATHETERIZED  Final   Special Requests NONE  Final   Culture   Final    NO GROWTH Performed at [facility], [address], [place], [place] [address]    Report Status 08/14/2019 FINAL  Final  Culture, respiratory (non-expectorated)  Status: None   Collection Time: 08/13/19  4:10 PM   Specimen: Tracheal Aspirate; Respiratory  Result Value Ref Range Status   Specimen Description TRACHEAL ASPIRATE  Final   Special Requests NONE  Final   Gram Stain   Final    FEW WBC PRESENT, PREDOMINANTLY PMN RARE GRAM POSITIVE COCCI RARE GRAM POSITIVE RODS Performed at [facility], [place] [address], [place], [place] [address]    Culture   Final    FEW ENTEROBACTER CLOACAE FEW STAPHYLOCOCCUS AUREUS    Report Status 08/16/2019 FINAL  Final   Organism ID, Bacteria STAPHYLOCOCCUS AUREUS  Final   Organism ID, Bacteria ENTEROBACTER CLOACAE  Final      Susceptibility   Enterobacter cloacae - MIC*    CEFAZOLIN >=64 RESISTANT Resistant     CEFEPIME 8 INTERMEDIATE Intermediate     CEFTAZIDIME >=64 RESISTANT Resistant     CIPROFLOXACIN <=0.25  SENSITIVE Sensitive     GENTAMICIN <=1 SENSITIVE Sensitive     IMIPENEM <=0.25 SENSITIVE Sensitive     TRIMETH/SULFA <=20 SENSITIVE Sensitive     PIP/TAZO >=128 RESISTANT Resistant     * FEW ENTEROBACTER CLOACAE   Staphylococcus aureus - MIC*    CIPROFLOXACIN <=0.5 SENSITIVE Sensitive     ERYTHROMYCIN >=8 RESISTANT Resistant     GENTAMICIN <=0.5 SENSITIVE Sensitive     OXACILLIN <=0.25 SENSITIVE Sensitive     TETRACYCLINE <=1 SENSITIVE Sensitive     VANCOMYCIN <=0.5 SENSITIVE Sensitive     TRIMETH/SULFA <=10 SENSITIVE Sensitive     CLINDAMYCIN RESISTANT Resistant     RIFAMPIN <=0.5 SENSITIVE Sensitive     Inducible Clindamycin POSITIVE Resistant     * FEW STAPHYLOCOCCUS AUREUS  Culture, blood (routine x 2)     Status: None   Collection Time: 08/13/19  4:31 PM   Specimen: BLOOD LEFT ARM  Result Value Ref Range Status   Specimen Description BLOOD LEFT ARM  Final   Special Requests   Final    BOTTLES DRAWN AEROBIC ONLY Blood Culture adequate volume   Culture   Final    NO GROWTH 5 DAYS Performed at [facility], [address] [place], [place] [address]    Report Status 08/18/2019 FINAL  Final  Culture, blood (routine x 2)     Status: None   Collection Time: 08/13/19  4:31 PM   Specimen: BLOOD LEFT ARM  Result Value Ref Range Status   Specimen Description BLOOD LEFT ARM  Final   Special Requests   Final    BOTTLES DRAWN AEROBIC ONLY Blood Culture adequate volume   Culture   Final    NO GROWTH 5 DAYS Performed at [facility], [place] [address], [place], [place] [address]    Report Status 08/18/2019 FINAL  Final    Anti-infectives:  Anti-infectives (From admission, onward)   Start     Dose/Rate Route Frequency Ordered Stop   08/06/19 1000  sulfamethoxazole-trimethoprim (BACTRIM) 200-40 MG/5ML suspension 20 mL     20 mL Per Tube Every 12 hours 08/06/19 0848 08/19/19 2231   08/04/19 1400  ceFEPIme (MAXIPIME) 2 g in sodium chloride 0.9 % 100 mL IVPB  Status:   Discontinued     2 g 200 mL/hr over 30 Minutes Intravenous Every 8 hours 08/04/19 1354 08/06/19 0941   07/24/19 1200  cefTRIAXone (ROCEPHIN) 2 g in sodium chloride 0.9 % 100 mL IVPB     2 g 200 mL/hr over 30 Minutes Intravenous Every 24  hours 07/24/19 1100 07/31/19 1138   07/23/19 0400  vancomycin (VANCOREADY) IVPB 1750 mg/350 mL  Status:  Discontinued     1,750 mg 175 mL/hr over 120 Minutes Intravenous Every 12 hours 07/22/19 1552 07/24/19 1100   07/22/19 1600  vancomycin (VANCOCIN) 2,500 mg in sodium chloride 0.9 % 500 mL IVPB     2,500 mg 250 mL/hr over 120 Minutes Intravenous  Once 07/22/19 1552 07/22/19 2214   07/22/19 1600  ceFEPIme (MAXIPIME) 2 g in sodium chloride 0.9 % 100 mL IVPB  Status:  Discontinued     2 g 200 mL/hr over 30 Minutes Intravenous Every 8 hours 07/22/19 1552 07/24/19 1100   07/05/19 2200  vancomycin (VANCOREADY) IVPB 1750 mg/350 mL  Status:  Discontinued     1,750 mg 175 mL/hr over 120 Minutes Intravenous Every 8 hours 07/05/19 1358 07/08/19 1039   07/05/19 1630  meropenem (MERREM) 1 g in sodium chloride 0.9 % 100 mL IVPB     1 g 200 mL/hr over 30 Minutes Intravenous Every 8 hours 07/05/19 1609 07/11/19 1816   07/05/19 1400  vancomycin (VANCOREADY) IVPB 2000 mg/400 mL     2,000 mg 200 mL/hr over 120 Minutes Intravenous  Once 07/05/19 1358 07/05/19 1644   07/07/2019 0600  ceFAZolin (ANCEF) IVPB 2g/100 mL premix  Status:  Discontinued     2 g 200 mL/hr over 30 Minutes Intravenous Every 8 hours 07/03/19 1201 07/05/19 1609   07/02/19 0845  levofloxacin (LEVAQUIN) IVPB 750 mg  Status:  Discontinued     750 mg 100 mL/hr over 90 Minutes Intravenous Every 24 hours 07/02/19 0831 07/03/19 1201   07/05/2019 1732  bacitracin 50,000 Units in sodium chloride 0.9 % 500 mL irrigation  Status:  Discontinued       As needed 07/11/2019 1733 07/13/2019 1828   06/25/19 0400  vancomycin (VANCOREADY) IVPB 1500 mg/300 mL     1,500 mg 150 mL/hr over 120 Minutes Intravenous Every 12 hours  06/25/19 0121 06/25/19 1811   07/02/2019 2228  bacitracin 50,000 Units in sodium chloride 0.9 % 500 mL irrigation  Status:  Discontinued       As needed 06/23/2019 2229 06/25/19 0005   07/08/2019 2000  ceFAZolin (ANCEF) 3 g in dextrose 5 % 50 mL IVPB     3 g 100 mL/hr over 30 Minutes Intravenous  Once 07/16/2019 1947 07/20/2019 2031      Best Practice/Protocols:  VTE Prophylaxis: Lovenox (prophylaxtic dose) Continous Sedation  Consults:     Studies:    Events:  Subjective:    Overnight Issues:   Objective:  Vital signs for last 24 hours: Temp:  [98.8 F (37.1 C)-101.5 F (38.6 C)] 99.5 F (37.5 C) (03/05 0800) Pulse Rate:  [81-117] 87 (03/05 1000) Resp:  [17-37] 17 (03/05 1000) BP: (103-145)/(47-95) 128/78 (03/05 1000) SpO2:  [97 %-100 %] 100 % (03/05 1000) FiO2 (%):  [40 %] 40 % (03/05 0804) Weight:  [110 kg] 110 kg (03/05 0500)  Hemodynamic parameters for last 24 hours:    Intake/Output from previous day: 03/04 0701 - 03/05 0700 In: 4155.3 [I.V.:1460.3; NG/GT:2695] Out: 3250 [Urine:3100; Stool:150]  Intake/Output this shift: Total I/O In: 438.5 [I.V.:178.5; NG/GT:260] Out: -   Vent settings for last 24 hours: Vent Mode: PRVC FiO2 (%):  [40 %] 40 % Set Rate:  [14 bmp] 14 bmp Vt Set:  [390 mL] 390 mL PEEP:  [5 cmH20] 5 cmH20  Physical Exam:  General: on vent Neuro: calm, not storming now  HEENT/Neck: trach with wound Resp: clear to auscultation bilaterally CVS: RRR GI: soft, NT, PEG Extremities: no edema  Results for orders placed or performed during the hospital encounter of 07/13/2019 (from the past 24 hour(s))  Glucose, capillary     Status: Abnormal   Collection Time: 08/22/19 11:52 AM  Result Value Ref Range   Glucose-Capillary 113 (H) 70 - 99 mg/dL  Glucose, capillary     Status: Abnormal   Collection Time: 08/22/19 11:53 AM  Result Value Ref Range   Glucose-Capillary 117 (H) 70 - 99 mg/dL  Triglycerides     Status: Abnormal   Collection Time:  08/22/19  3:26 PM  Result Value Ref Range   Triglycerides 354 (H) <150 mg/dL  Glucose, capillary     Status: Abnormal   Collection Time: 08/22/19  3:40 PM  Result Value Ref Range   Glucose-Capillary 111 (H) 70 - 99 mg/dL   Comment 1 Notify RN    Comment 2 Document in Chart   Glucose, capillary     Status: Abnormal   Collection Time: 08/22/19  7:22 PM  Result Value Ref Range   Glucose-Capillary 109 (H) 70 - 99 mg/dL  Glucose, capillary     Status: Abnormal   Collection Time: 08/22/19 11:11 PM  Result Value Ref Range   Glucose-Capillary 115 (H) 70 - 99 mg/dL  Glucose, capillary     Status: Abnormal   Collection Time: 08/23/19  3:19 AM  Result Value Ref Range   Glucose-Capillary 105 (H) 70 - 99 mg/dL  Basic metabolic panel     Status: Abnormal   Collection Time: 08/23/19  5:33 AM  Result Value Ref Range   Sodium 141 135 - 145 mmol/L   Potassium 3.7 3.5 - 5.1 mmol/L   Chloride 107 98 - 111 mmol/L   CO2 20 (L) 22 - 32 mmol/L   Glucose, Bld 100 (H) 70 - 99 mg/dL   BUN 15 6 - 20 mg/dL   Creatinine, Ser 0.49 (L) 0.61 - 1.24 mg/dL   Calcium 8.2 (L) 8.9 - 10.3 mg/dL   GFR calc non Af Amer >60 >60 mL/min   GFR calc Af Amer >60 >60 mL/min   Anion gap 14 5 - 15  Glucose, capillary     Status: Abnormal   Collection Time: 08/23/19  8:19 AM  Result Value Ref Range   Glucose-Capillary 108 (H) 70 - 99 mg/dL   Comment 1 Notify RN    Comment 2 Document in Chart     Assessment & Plan: Present on Admission: . Epidural hematoma ([place])    LOS: 60 days   Additional comments:I reviewed the patient's new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

## 2019-08-23 NOTE — Progress Notes (Signed)
Patient ID: Jay Cole, male   DOB: 1996-04-05, 24 y.o.   MRN: [id number]   Continued intermittent social visits and telephone calls during this hospitalization creating space and opportunity for patient's mother to explore her thoughts and feelings regarding her son's current medical situation.   Always offering  information/education on the seriousness of his brain injury and the poor prognosis  for  meaningful recovery.  Emotional support offered      Chaplain services are involved  Questions and concerns addressed   Discussed with bedside RN  Palliative medicine team will continue to support holistically  No charge  Wadie Lessen NP  Palliative Medicine Team Team Phone # [phone] Pager [phone]

## 2019-08-24 LAB — BASIC METABOLIC PANEL
Anion gap: 9 (ref 5–15)
BUN: 14 mg/dL (ref 6–20)
CO2: 26 mmol/L (ref 22–32)
Calcium: 8.4 mg/dL — ABNORMAL LOW (ref 8.9–10.3)
Chloride: 110 mmol/L (ref 98–111)
Creatinine, Ser: 0.4 mg/dL — ABNORMAL LOW (ref 0.61–1.24)
GFR calc Af Amer: >60 mL/min (ref >60)
GFR calc non Af Amer: >60 mL/min (ref >60)
Glucose, Bld: 126 mg/dL — ABNORMAL HIGH (ref 70–99)
Potassium: 3.4 mmol/L — ABNORMAL LOW (ref 3.5–5.1)
Sodium: 145 mmol/L (ref 135–145)

## 2019-08-24 LAB — GLUCOSE, CAPILLARY
Glucose-Capillary: 132 mg/dL — ABNORMAL HIGH (ref 70–99)
Glucose-Capillary: 133 mg/dL — ABNORMAL HIGH (ref 70–99)
Glucose-Capillary: 118 mg/dL — ABNORMAL HIGH (ref 70–99)
Glucose-Capillary: 131 mg/dL — ABNORMAL HIGH (ref 70–99)
Glucose-Capillary: 102 mg/dL — ABNORMAL HIGH (ref 70–99)
Glucose-Capillary: 123 mg/dL — ABNORMAL HIGH (ref 70–99)

## 2019-08-24 MED ORDER — POTASSIUM CHLORIDE 20 MEQ/15ML (10%) PO SOLN
40.0000 meq | Freq: Once | ORAL | Status: AC
  Administered 2019-08-24: 40 meq
  Filled 2019-08-24: qty 30

## 2019-08-24 NOTE — Plan of Care (Signed)
  Problem: Education: Goal: Knowledge of General Education information will improve Description: Including pain rating scale, medication(s)/side effects and non-pharmacologic comfort measures Outcome: Not Progressing   Problem: Health Behavior/Discharge Planning: Goal: Ability to manage health-related needs will improve Outcome: Not Progressing   Problem: Clinical Measurements: Goal: Ability to maintain clinical measurements within normal limits will improve Outcome: Not Progressing Goal: Will remain free from infection Outcome: Not Progressing Goal: Diagnostic test results will improve Outcome: Not Progressing Goal: Respiratory complications will improve Outcome: Not Progressing Goal: Cardiovascular complication will be avoided Outcome: Not Progressing   Problem: Activity: Goal: Risk for activity intolerance will decrease Outcome: Not Progressing   Problem: Nutrition: Goal: Adequate nutrition will be maintained Outcome: Not Progressing   Problem: Coping: Goal: Level of anxiety will decrease Outcome: Not Progressing   Problem: Elimination: Goal: Will not experience complications related to bowel motility Outcome: Not Progressing Goal: Will not experience complications related to urinary retention Outcome: Not Progressing   Problem: Pain Managment: Goal: General experience of comfort will improve Outcome: Not Progressing   Problem: Safety: Goal: Ability to remain free from injury will improve Outcome: Not Progressing   Problem: Skin Integrity: Goal: Risk for impaired skin integrity will decrease Outcome: Not Progressing   Problem: Education: Goal: Knowledge of the prescribed therapeutic regimen Outcome: Not Progressing Goal: Knowledge of disease or condition will improve Outcome: Not Progressing   Problem: Clinical Measurements: Goal: Neurologic status will improve Outcome: Not Progressing   Problem: Tissue Perfusion: Goal: Ability to maintain intracranial  pressure will improve Outcome: Not Progressing   Problem: Respiratory: Goal: Will regain and/or maintain adequate ventilation Outcome: Not Progressing   Problem: Skin Integrity: Goal: Risk for impaired skin integrity will decrease Outcome: Not Progressing Goal: Demonstration of wound healing without infection will improve Outcome: Not Progressing   Problem: Psychosocial: Goal: Ability to verbalize positive feelings about self will improve Outcome: Not Progressing Goal: Ability to participate in self-care as condition permits will improve Outcome: Not Progressing Goal: Ability to identify appropriate support needs will improve Outcome: Not Progressing   Problem: Health Behavior/Discharge Planning: Goal: Ability to manage health-related needs will improve Outcome: Not Progressing   Problem: Nutritional: Goal: Risk of aspiration will decrease Outcome: Not Progressing Goal: Dietary intake will improve Outcome: Not Progressing   Problem: Communication: Goal: Ability to communicate needs accurately will improve Outcome: Not Progressing

## 2019-08-24 NOTE — Progress Notes (Signed)
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 pursue maximal therapies. On propranolol (max dose), clonidine (max dose), ativan, seroquel, bromocriptine for neuro storming. MRI reviewed by Dr. Ellene Route 2/9 suggesting profound brain injury and ex vacuo subdural hygromas that would not provide any clinical benefit if drained. Continue to expect a poor prognosis for meaningful recovery. Discussion with parents regarding MRI results and prognosis by both Dr. Grandville Silos and Dr. Ellene Route 2/9, mother  continues to desire aggressive care. Continued conversations with mother regarding clinical status and lack of improvement by primary trauma team.  No further neuro storming - back on propofol, morphine PRN, pentobarbital per Dr. Ellene Route but has not needed this  Acute hypoxic ventilator dependent respiratory failure with severe ARDS - trach 1/22, PSV trial set off hours of storming - hold off on weaning Multiple abrasions - local wound care, ensure adequate pressure off-loading of scalp over crani site  L1 TVP FX - pain control.   HTN - Propranolol and clonidine for neuro storming  Urinary retention - bethanechol FEN - TF to continue,  watch Na, replete hypokalemia ID - completed Bactrim for enterobacter and persistent OSSA PNA 3/2, WBC has been WNL.  Fevers are central. VTE - SCDs, LMWH  Dispo - ICU, TOC team working on placement in [place]. Now that storming controlled, may be easier to place.  Very poor prognosis Critical Care Total Time*: 35 Minutes  Georganna Skeans, MD, MPH, FACS Trauma & General Surgery Use [email or web address] to contact on call provider  08/24/2019  *Care during the described time interval was provided by me. I have reviewed this patient's available data, including medical history, events of note, physical examination and test results as part of my evaluation.

## 2019-08-25 LAB — BASIC METABOLIC PANEL
Anion gap: 10 (ref 5–15)
BUN: 17 mg/dL (ref 6–20)
CO2: 28 mmol/L (ref 22–32)
Calcium: 9.4 mg/dL (ref 8.9–10.3)
Chloride: 113 mmol/L — ABNORMAL HIGH (ref 98–111)
Creatinine, Ser: 0.55 mg/dL — ABNORMAL LOW (ref 0.61–1.24)
GFR calc Af Amer: >60 mL/min (ref >60)
GFR calc non Af Amer: >60 mL/min (ref >60)
Glucose, Bld: 126 mg/dL — ABNORMAL HIGH (ref 70–99)
Potassium: 4.3 mmol/L (ref 3.5–5.1)
Sodium: 151 mmol/L — ABNORMAL HIGH (ref 135–145)

## 2019-08-25 LAB — GLUCOSE, CAPILLARY
Glucose-Capillary: 108 mg/dL — ABNORMAL HIGH (ref 70–99)
Glucose-Capillary: 115 mg/dL — ABNORMAL HIGH (ref 70–99)
Glucose-Capillary: 125 mg/dL — ABNORMAL HIGH (ref 70–99)
Glucose-Capillary: 115 mg/dL — ABNORMAL HIGH (ref 70–99)
Glucose-Capillary: 111 mg/dL — ABNORMAL HIGH (ref 70–99)
Glucose-Capillary: 144 mg/dL — ABNORMAL HIGH (ref 70–99)

## 2019-08-25 LAB — TRIGLYCERIDES: Triglycerides: 384 mg/dL — ABNORMAL HIGH (ref <150)

## 2019-08-25 MED ORDER — FREE WATER
200.0000 mL | Freq: Two times a day (BID) | Status: DC
  Administered 2019-08-25 – 2019-08-26 (×4): 200 mL
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
## 2019-08-26 LAB — GLUCOSE, CAPILLARY
Glucose-Capillary: 124 mg/dL — ABNORMAL HIGH (ref 70–99)
Glucose-Capillary: 119 mg/dL — ABNORMAL HIGH (ref 70–99)
Glucose-Capillary: 114 mg/dL — ABNORMAL HIGH (ref 70–99)
Glucose-Capillary: 123 mg/dL — ABNORMAL HIGH (ref 70–99)
Glucose-Capillary: 132 mg/dL — ABNORMAL HIGH (ref 70–99)
Glucose-Capillary: 148 mg/dL — ABNORMAL HIGH (ref 70–99)

## 2019-08-26 LAB — BASIC METABOLIC PANEL
Anion gap: 11 (ref 5–15)
BUN: 15 mg/dL (ref 6–20)
CO2: 27 mmol/L (ref 22–32)
Calcium: 8.8 mg/dL — ABNORMAL LOW (ref 8.9–10.3)
Chloride: 108 mmol/L (ref 98–111)
Creatinine, Ser: 0.46 mg/dL — ABNORMAL LOW (ref 0.61–1.24)
GFR calc Af Amer: >60 mL/min (ref >60)
GFR calc non Af Amer: >60 mL/min (ref >60)
Glucose, Bld: 110 mg/dL — ABNORMAL HIGH (ref 70–99)
Potassium: 4 mmol/L (ref 3.5–5.1)
Sodium: 146 mmol/L — ABNORMAL HIGH (ref 135–145)

## 2019-08-26 NOTE — Progress Notes (Signed)
Pt neuro storming. Pupils unequal and sluggish. Dr. Grandville Silos at bedside.

## 2019-08-26 NOTE — Progress Notes (Signed)
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 dose), clonidine (max dose), ativan, seroquel, bromocriptine for neuro storming. MRI reviewed by Dr. Ellene Route 2/9 suggesting profound brain injury and ex vacuo subdural hygromas that would not provide any clinical benefit if drained. Continue to expect a poor prognosis for meaningful recovery. Discussion with parents regarding MRI results and prognosis by both Dr. Grandville Silos and Dr. Ellene Route 2/9, mother continues to desire  aggressive care. Continued conversations with mother regarding clinical status and lack of improvement by primary trauma team.  Active neuro storming now despite Propofol - try pentobarbital per Dr. Ellene Route  Acute hypoxic ventilator dependent respiratory failure with severe ARDS- trach 1/22, PSV trial set off hours of storming - hold off on weaning Multiple abrasions - local wound care, ensure adequate pressure off-loading of scalp over crani site  L1 TVP FX - pain control.   HTN- Propranolol and clonidine for neuro storming  Urinary retention - bethanechol FEN- TF to continue Hypernatremia - 200cc free water BID ID - completed Bactrim for enterobacter and persistent OSSA PNA 3/2, WBC  has been WNL.  Fevers are central. VTE- SCDs, LMWH  Dispo- ICU, TOC team working on placement in [place] but continues to struggle with storming. Critical Care Total Time*: 36 Minutes  Georganna Skeans, MD, MPH, FACS Trauma & General Surgery Use [email or web address] to contact on call provider  08/26/2019  *Care during the described time interval was provided by me. I have reviewed this patient's available data, including medical history, events of note, physical examination and test results as part of my evaluation.

## 2019-08-26 NOTE — Progress Notes (Signed)
Patient ID: Jay Cole, male   DOB: [birth year], 24 y.o.   MRN: [id number] Neuro storming has been problematic today and required substantial increase in medication beyond propofol required morphine and Ativan things are calming down now however it took over 3 hours to procure the pentobarbital so this was not given.  Again his overall status is poor and his prognosis remains unchanged.  Nursing is continuing supportive care.

## 2019-08-27 ENCOUNTER — Other Ambulatory Visit: Payer: Self-pay

## 2019-08-27 LAB — CBC
HCT: 28.2 % — ABNORMAL LOW (ref 39.0–52.0)
Hemoglobin: 8.4 g/dL — ABNORMAL LOW (ref 13.0–17.0)
MCH: 28.6 pg (ref 26.0–34.0)
MCHC: 29.8 g/dL — ABNORMAL LOW (ref 30.0–36.0)
MCV: 95.9 fL (ref 80.0–100.0)
Platelets: 229 10*3/uL (ref 150–400)
RBC: 2.94 MIL/uL — ABNORMAL LOW (ref 4.22–5.81)
RDW: 17 % — ABNORMAL HIGH (ref 11.5–15.5)
WBC: 7.6 10*3/uL (ref 4.0–10.5)
nRBC: 0.3 % — ABNORMAL HIGH (ref 0.0–0.2)

## 2019-08-27 LAB — BASIC METABOLIC PANEL
Anion gap: 11 (ref 5–15)
BUN: 18 mg/dL (ref 6–20)
CO2: 28 mmol/L (ref 22–32)
Calcium: 9 mg/dL (ref 8.9–10.3)
Chloride: 107 mmol/L (ref 98–111)
Creatinine, Ser: 0.48 mg/dL — ABNORMAL LOW (ref 0.61–1.24)
GFR calc Af Amer: >60 mL/min (ref >60)
GFR calc non Af Amer: >60 mL/min (ref >60)
Glucose, Bld: 183 mg/dL — ABNORMAL HIGH (ref 70–99)
Potassium: 3.6 mmol/L (ref 3.5–5.1)
Sodium: 146 mmol/L — ABNORMAL HIGH (ref 135–145)

## 2019-08-27 LAB — GLUCOSE, CAPILLARY
Glucose-Capillary: 108 mg/dL — ABNORMAL HIGH (ref 70–99)
Glucose-Capillary: 126 mg/dL — ABNORMAL HIGH (ref 70–99)
Glucose-Capillary: 129 mg/dL — ABNORMAL HIGH (ref 70–99)
Glucose-Capillary: 135 mg/dL — ABNORMAL HIGH (ref 70–99)
Glucose-Capillary: 154 mg/dL — ABNORMAL HIGH (ref 70–99)
Glucose-Capillary: 154 mg/dL — ABNORMAL HIGH (ref 70–99)

## 2019-08-27 MED ORDER — FREE WATER
200.0000 mL | Freq: Three times a day (TID) | Status: DC
  Administered 2019-08-27 – 2019-09-11 (×46): 200 mL

## 2019-08-27 NOTE — TOC Progression Note (Signed)
Transition of Care [facility] - Progression Note    Patient Details  Name: Jay Cole MRN: [id number] Date of Birth: [birth year]  Transition of Care [facility]) CM/SW Contact  Oren Section Cleta Alberts, RN Phone Number: 08/27/2019, 4:34 PM  Clinical Narrative: Received call from Kirby with [facility] regarding referral sent previously.  After extensive review, facility is interested in patient. Faxed current MAR to [facility] at [phone].  Facility is verifying Medicaid benefits and will be in touch with potential admission information.  I have spoken with patient's mother, and she plans to look at facility on line.    Contact for vent SNF, [facility]:  [phone]      Expected Discharge Plan: Long Term Acute Care (LTAC) Barriers to Discharge: Continued Medical Work up, Vent Bed not available  Expected Discharge Plan and Services Expected Discharge Plan: Long Term Acute Care (LTAC)   Discharge Planning Services: CM Consult Post Acute Care Choice: Long Term Acute Care (LTAC) Living arrangements for the past 2 months: Single Family Home                                       Social Determinants of Health (SDOH) Interventions    Readmission Risk Interventions No flowsheet data found.  Reinaldo Raddle, RN, BSN  Trauma/Neuro ICU Case Manager [phone]

## 2019-08-27 NOTE — Progress Notes (Signed)
Patient ID: Jay Cole, male   DOB: 1996-05-11, 24 y.o.   MRN: [id number] I met with his mother at the bedside. His father was on face time. They want to continue full support. His mother is hopeful for placement at the facility in [place], [place].  Georganna Skeans, MD, MPH, FACS Please use [email or web address] to contact on call provider

## 2019-08-27 NOTE — Progress Notes (Signed)
Pentobarbital 100mg  in D5W 142mL bag returned to pharmacy. Bag wasted by Gertie Exon, rph witnessed by Lesly Rubenstein, CPht.

## 2019-08-27 NOTE — Progress Notes (Signed)
Patient ID: Jay Cole, male   DOB: 05-03-1996, 24 y.o.   MRN: [id number] Follow up - Trauma Critical Care  Patient Details:    Jay Cole is an 24 y.o. male.  Lines/tubes : PICC Double Lumen 06/26/19 PICC Right Brachial 40 cm 0 cm (Active)  Indication for Insertion or Continuance of Line Prolonged intravenous therapies 08/26/19 2000  Exposed Catheter (cm) 0 cm 06/26/19 2000  Site Assessment Clean;Dry;Intact 08/26/19 2000  Lumen #1 Status In-line blood sampling system in place;Flushed;Blood return noted;Infusing 08/26/19 2000  Lumen #2 Status Infusing 08/26/19 2000  Dressing Type Transparent;Occlusive 08/26/19 2000  Dressing Status Clean;Dry;Intact;Antimicrobial disc in place 08/26/19 2000  Line Care Lumen 2 tubing changed 08/27/19 0148  Line Adjustment (NICU/IV Team Only) No 08/13/19 0900  Dressing Intervention Antimicrobial disc changed;Dressing changed 08/26/19 1200  Dressing Change Due 09/02/19 08/26/19 2000     Gastrostomy/Enterostomy Percutaneous endoscopic gastrostomy (PEG) 24 Fr. (Active)  Surrounding Skin Dry;Intact 08/26/19 2000  Tube Status Patent 08/26/19 2000  Drainage Appearance None 08/26/19 2000  Catheter Position (cm marking) 7 cm 08/11/19 2000  Dressing Status Clean;Dry;Intact 08/26/19 2000  Dressing Intervention New dressing 08/26/19 0800  Dressing Type Split gauze 08/26/19 2000  Dressing Change Due 08/16/19 08/14/19 0800  G Port Intake (mL) 70 ml 08/27/19 0553  J Port Intake (mL) 100 ml 08/12/19 1800  Output (mL) 0 mL 08/24/19 0800     Rectal Tube/Pouch (Active)  Output (mL) 50 mL 08/27/19 0600     External Urinary Catheter (Active)  Collection Container Standard drainage bag 08/26/19 2000  Securement Method Securing device (Describe) 08/26/19 1600  Site Assessment Clean;Intact 08/26/19 2000  Intervention Equipment Changed 08/20/19 0600  Output (mL) 50 mL 08/27/19 0600    Microbiology/Sepsis markers: Results for orders placed or performed during  the hospital encounter of 07/14/2019  Respiratory Panel by RT PCR (Flu A&B, Covid) - Nasopharyngeal Swab     Status: None   Collection Time: 06/28/2019  8:36 PM   Specimen: Nasopharyngeal Swab  Result Value Ref Range Status   SARS Coronavirus 2 by RT PCR NEGATIVE NEGATIVE Final    Comment: (NOTE) SARS-CoV-2 target nucleic acids are NOT DETECTED. The SARS-CoV-2 RNA is generally detectable in upper respiratoy specimens during the acute phase of infection. The lowest concentration of SARS-CoV-2 viral copies this assay can detect is 131 copies/mL. A negative result does not preclude SARS-Cov-2 infection and should not be used as the sole basis for treatment or other patient management decisions. A negative result may occur with  improper specimen collection/handling, submission of specimen other than nasopharyngeal swab, presence of viral mutation(s) within the areas targeted by this assay, and inadequate number of viral copies (<131 copies/mL). A negative result must be combined with clinical observations, patient history, and epidemiological information. The expected result is Negative. Fact Sheet for Patients:  PinkCheek.be Fact Sheet for Healthcare Providers:  GravelBags.it This test is not yet ap proved or cleared by the [place] FDA and  has been authorized for detection and/or diagnosis of SARS-CoV-2 by FDA under an Emergency Use Authorization (EUA). This EUA will remain  in effect (meaning this test can be used) for the duration of the COVID-19 declaration under Section 564(b)(1) of the Act, 21 U.S.C. section 360bbb-3(b)(1), unless the authorization is terminated or revoked sooner.    Influenza A by PCR NEGATIVE NEGATIVE Final   Influenza B by PCR NEGATIVE NEGATIVE Final    Comment: (NOTE) The Xpert Xpress SARS-CoV-2/FLU/RSV assay is intended as an  aid in  the diagnosis of influenza from Nasopharyngeal swab specimens  and  should not be used as a sole basis for treatment. Nasal washings and  aspirates are unacceptable for Xpert Xpress SARS-CoV-2/FLU/RSV  testing. Fact Sheet for Patients: PinkCheek.be Fact Sheet for Healthcare Providers: GravelBags.it This test is not yet approved or cleared by the [place] FDA and  has been authorized for detection and/or diagnosis of SARS-CoV-2 by  FDA under an Emergency Use Authorization (EUA). This EUA will remain  in effect (meaning this test can be used) for the duration of the  Covid-19 declaration under Section 564(b)(1) of the Act, 21  U.S.C. section 360bbb-3(b)(1), unless the authorization is  terminated or revoked. Performed at [facility], [place] [address], [place], [place] [address]   MRSA PCR Screening     Status: None   Collection Time: 06/25/19 12:50 AM   Specimen: Nasal Mucosa; Nasopharyngeal  Result Value Ref Range Status   MRSA by PCR NEGATIVE NEGATIVE Final    Comment:        The GeneXpert MRSA Assay (FDA approved for NASAL specimens only), is one component of a comprehensive MRSA colonization surveillance program. It is not intended to diagnose MRSA infection nor to guide or monitor treatment for MRSA infections. Performed at [facility], [place] [address] [place], [place] [address]   Culture, respiratory (non-expectorated)     Status: None   Collection Time: 07/01/19  7:11 AM   Specimen: Tracheal Aspirate; Respiratory  Result Value Ref Range Status   Specimen Description TRACHEAL ASPIRATE  Final   Special Requests NONE  Final   Gram Stain   Final    FEW WBC PRESENT, PREDOMINANTLY PMN FEW GRAM POSITIVE COCCI IN PAIRS FEW GRAM POSITIVE RODS Performed at [facility], [place] [address], [place], [place] [address]    Culture ABUNDANT STAPHYLOCOCCUS AUREUS  Final   Report Status 07/09/2019 FINAL  Final   Organism ID, Bacteria STAPHYLOCOCCUS AUREUS  Final       Susceptibility   Staphylococcus aureus - MIC*    CIPROFLOXACIN <=0.5 SENSITIVE Sensitive     ERYTHROMYCIN RESISTANT Resistant     GENTAMICIN <=0.5 SENSITIVE Sensitive     OXACILLIN 0.5 SENSITIVE Sensitive     TETRACYCLINE <=1 SENSITIVE Sensitive     VANCOMYCIN <=0.5 SENSITIVE Sensitive     TRIMETH/SULFA <=10 SENSITIVE Sensitive     CLINDAMYCIN RESISTANT Resistant     RIFAMPIN <=0.5 SENSITIVE Sensitive     Inducible Clindamycin POSITIVE Resistant     * ABUNDANT STAPHYLOCOCCUS AUREUS  Culture, blood (routine x 2)     Status: None   Collection Time: 07/01/19  8:45 AM   Specimen: BLOOD  Result Value Ref Range Status   Specimen Description BLOOD LEFT ANTECUBITAL  Final   Special Requests AEROBIC BOTTLE ONLY Blood Culture adequate volume  Final   Culture   Final    NO GROWTH 5 DAYS Performed at [facility], [place] [address], [place],  [address]    Report Status 07/06/2019 FINAL  Final  Culture, blood (routine x 2)     Status: None   Collection Time: 07/01/19  8:52 AM   Specimen: BLOOD  Result Value Ref Range Status   Specimen Description BLOOD LEFT ANTECUBITAL  Final   Special Requests AEROBIC BOTTLE ONLY Blood Culture adequate volume  Final   Culture   Final    NO GROWTH 5 DAYS Performed at [facility], [address] [place], [place] [address]    Report Status 07/06/2019 FINAL  Final  Culture, respiratory (non-expectorated)     Status: None   Collection Time: 07/05/19  9:32 AM   Specimen: Tracheal Aspirate; Respiratory  Result Value Ref Range Status   Specimen Description TRACHEAL ASPIRATE  Final   Special Requests NONE  Final   Gram Stain   Final    MODERATE WBC PRESENT,BOTH PMN AND MONONUCLEAR RARE GRAM POSITIVE COCCI FEW GRAM VARIABLE ROD Performed at [facility], [place] [address] [place], [place] [address]    Culture RARE STAPHYLOCOCCUS AUREUS  Final   Report Status 07/08/2019 FINAL  Final   Organism ID, Bacteria STAPHYLOCOCCUS AUREUS  Final       Susceptibility   Staphylococcus aureus - MIC*    CIPROFLOXACIN <=0.5 SENSITIVE Sensitive     ERYTHROMYCIN RESISTANT Resistant     GENTAMICIN <=0.5 SENSITIVE Sensitive     OXACILLIN 0.5 SENSITIVE Sensitive     TETRACYCLINE <=1 SENSITIVE Sensitive     VANCOMYCIN 1 SENSITIVE Sensitive     TRIMETH/SULFA <=10 SENSITIVE Sensitive     CLINDAMYCIN RESISTANT Resistant     RIFAMPIN <=0.5 SENSITIVE Sensitive     Inducible Clindamycin POSITIVE Resistant     * RARE STAPHYLOCOCCUS AUREUS  Culture, blood (routine x 2)     Status: None   Collection Time: 07/05/19 12:00 PM   Specimen: BLOOD  Result Value Ref Range Status   Specimen Description BLOOD LEFT ANTECUBITAL  Final   Special Requests   Final    BOTTLES DRAWN AEROBIC ONLY Blood Culture adequate volume   Culture   Final    NO GROWTH 5 DAYS Performed at [facility], [address] [place], [place] [address]    Report Status 07/10/2019 FINAL  Final  Culture, blood (routine x 2)     Status: None   Collection Time: 07/05/19 12:23 PM   Specimen: BLOOD  Result Value Ref Range Status   Specimen Description BLOOD LEFT ANTECUBITAL  Final   Special Requests   Final    BOTTLES DRAWN AEROBIC ONLY Blood Culture adequate volume   Culture   Final    NO GROWTH 5 DAYS Performed at [facility], [place] [address] [place], [place] [address]    Report Status 07/10/2019 FINAL  Final  Culture, blood (routine x 2)     Status: None   Collection Time: 07/22/19  2:42 PM   Specimen: BLOOD LEFT HAND  Result Value Ref Range Status   Specimen Description BLOOD LEFT HAND  Final   Special Requests   Final    BOTTLES DRAWN AEROBIC ONLY Blood Culture adequate volume   Culture   Final    NO GROWTH 5 DAYS Performed at [facility], [place] [address] [place], [place] [address]    Report Status 07/27/2019 FINAL  Final  Culture, blood (routine x 2)     Status: None   Collection Time: 07/22/19  2:43 PM   Specimen: BLOOD LEFT HAND  Result Value  Ref Range Status   Specimen Description BLOOD LEFT HAND  Final   Special Requests   Final    BOTTLES DRAWN AEROBIC ONLY Blood Culture adequate volume   Culture   Final    NO GROWTH 5 DAYS Performed at [facility], [place] [address] [place], [place] [address]    Report Status 07/27/2019 FINAL  Final  Culture, respiratory (non-expectorated)  Status: None   Collection Time: 07/22/19  4:13 PM   Specimen: Tracheal Aspirate; Respiratory  Result Value Ref Range Status   Specimen Description TRACHEAL ASPIRATE  Final   Special Requests NONE  Final   Gram Stain   Final    ABUNDANT WBC PRESENT, PREDOMINANTLY PMN ABUNDANT GRAM POSITIVE COCCI FEW GRAM POSITIVE RODS Performed at [facility], [place] [address], [place], [place] [address]    Culture MODERATE STAPHYLOCOCCUS AUREUS  Final   Report Status 07/24/2019 FINAL  Final   Organism ID, Bacteria STAPHYLOCOCCUS AUREUS  Final      Susceptibility   Staphylococcus aureus - MIC*    CIPROFLOXACIN <=0.5 SENSITIVE Sensitive     ERYTHROMYCIN >=8 RESISTANT Resistant     GENTAMICIN <=0.5 SENSITIVE Sensitive     OXACILLIN <=0.25 SENSITIVE Sensitive     TETRACYCLINE <=1 SENSITIVE Sensitive     VANCOMYCIN 1 SENSITIVE Sensitive     TRIMETH/SULFA <=10 SENSITIVE Sensitive     CLINDAMYCIN RESISTANT Resistant     RIFAMPIN <=0.5 SENSITIVE Sensitive     Inducible Clindamycin POSITIVE Resistant     * MODERATE STAPHYLOCOCCUS AUREUS  Culture, respiratory (non-expectorated)     Status: None   Collection Time: 08/04/19  9:25 AM   Specimen: Tracheal Aspirate; Respiratory  Result Value Ref Range Status   Specimen Description TRACHEAL ASPIRATE  Final   Special Requests NONE  Final   Gram Stain   Final    MODERATE WBC PRESENT,BOTH PMN AND MONONUCLEAR MODERATE GRAM POSITIVE COCCI IN CLUSTERS MODERATE GRAM NEGATIVE COCCOBACILLI RARE GRAM NEGATIVE RODS RARE SQUAMOUS EPITHELIAL CELLS PRESENT Performed at [facility], [place] [address],  [place], [place] [address]    Culture   Final    ABUNDANT STAPHYLOCOCCUS AUREUS ABUNDANT ENTEROBACTER CLOACAE    Report Status 08/06/2019 FINAL  Final   Organism ID, Bacteria STAPHYLOCOCCUS AUREUS  Final   Organism ID, Bacteria ENTEROBACTER CLOACAE  Final      Susceptibility   Enterobacter cloacae - MIC*    CEFAZOLIN >=64 RESISTANT Resistant     CEFEPIME 4 INTERMEDIATE Intermediate     CEFTAZIDIME >=64 RESISTANT Resistant     CIPROFLOXACIN <=0.25 SENSITIVE Sensitive     GENTAMICIN <=1 SENSITIVE Sensitive     IMIPENEM <=0.25 SENSITIVE Sensitive     TRIMETH/SULFA <=20 SENSITIVE Sensitive     PIP/TAZO >=128 RESISTANT Resistant     * ABUNDANT ENTEROBACTER CLOACAE   Staphylococcus aureus - MIC*    CIPROFLOXACIN <=0.5 SENSITIVE Sensitive     ERYTHROMYCIN >=8 RESISTANT Resistant     GENTAMICIN <=0.5 SENSITIVE Sensitive     OXACILLIN 0.5 SENSITIVE Sensitive     TETRACYCLINE <=1 SENSITIVE Sensitive     VANCOMYCIN 1 SENSITIVE Sensitive     TRIMETH/SULFA <=10 SENSITIVE Sensitive     CLINDAMYCIN RESISTANT Resistant     RIFAMPIN <=0.5 SENSITIVE Sensitive     Inducible Clindamycin POSITIVE Resistant     * ABUNDANT STAPHYLOCOCCUS AUREUS  Culture, blood (routine x 2)     Status: None   Collection Time: 08/04/19  9:37 AM   Specimen: BLOOD  Result Value Ref Range Status   Specimen Description BLOOD LEFT ANTECUBITAL  Final   Special Requests   Final    BOTTLES DRAWN AEROBIC AND ANAEROBIC Blood Culture results may not be optimal due to an inadequate volume of blood received in culture bottles   Culture   Final    NO GROWTH 5 DAYS Performed at [facility],  [address] [place], [place] [address]    Report Status 08/09/2019 FINAL  Final  Culture, blood (routine x 2)     Status: None   Collection Time: 08/04/19  9:50 AM   Specimen: BLOOD LEFT ARM  Result Value Ref Range Status   Specimen Description BLOOD LEFT ARM  Final   Special Requests   Final    BOTTLES DRAWN AEROBIC AND ANAEROBIC  Blood Culture results may not be optimal due to an inadequate volume of blood received in culture bottles   Culture   Final    NO GROWTH 5 DAYS Performed at [facility], [place] [address], [place], [place] [address]    Report Status 08/09/2019 FINAL  Final  C difficile quick scan w PCR reflex     Status: None   Collection Time: 08/04/19  1:55 PM   Specimen: STOOL  Result Value Ref Range Status   C Diff antigen NEGATIVE NEGATIVE Final   C Diff toxin NEGATIVE NEGATIVE Final   C Diff interpretation No C. difficile detected.  Final    Comment: Performed at [facility], [place] [address], [place], [place] [address]  SARS CORONAVIRUS 2 (TAT 6-24 HRS) Nasopharyngeal Nasopharyngeal Swab     Status: None   Collection Time: 08/06/19  9:53 AM   Specimen: Nasopharyngeal Swab  Result Value Ref Range Status   SARS Coronavirus 2 NEGATIVE NEGATIVE Final    Comment: (NOTE) SARS-CoV-2 target nucleic acids are NOT DETECTED. The SARS-CoV-2 RNA is generally detectable in upper and lower respiratory specimens during the acute phase of infection. Negative results do not preclude SARS-CoV-2 infection, do not rule out co-infections with other pathogens, and should not be used as the sole basis for treatment or other patient management decisions. Negative results must be combined with clinical observations, patient history, and epidemiological information. The expected result is Negative. Fact Sheet for Patients: SugarRoll.be Fact Sheet for Healthcare Providers: https://www.woods-mathews.com/ This test is not yet approved or cleared by the [place] FDA and  has been authorized for detection and/or diagnosis of SARS-CoV-2 by FDA under an Emergency Use Authorization (EUA). This EUA will remain  in effect (meaning this test can be used) for the duration of the COVID-19 declaration under Section 56 4(b)(1) of the Act, 21 U.S.C. section 360bbb-3(b)(1),  unless the authorization is terminated or revoked sooner. Performed at [facility], [place] [address] [place], [place] [address]   MRSA PCR Screening     Status: None   Collection Time: 08/08/19  8:54 AM   Specimen: Nasopharyngeal  Result Value Ref Range Status   MRSA by PCR NEGATIVE NEGATIVE Final    Comment:        The GeneXpert MRSA Assay (FDA approved for NASAL specimens only), is one component of a comprehensive MRSA colonization surveillance program. It is not intended to diagnose MRSA infection nor to guide or monitor treatment for MRSA infections. Performed at [facility], [place] [address] [place], [place] [address]   Culture, Urine     Status: None   Collection Time: 08/13/19  4:10 PM   Specimen: Urine, Catheterized  Result Value Ref Range Status   Specimen Description URINE, CATHETERIZED  Final   Special Requests NONE  Final   Culture   Final    NO GROWTH Performed at [facility], [address], [place], [place] [address]    Report Status 08/14/2019 FINAL  Final  Culture, respiratory (non-expectorated)  Status: None   Collection Time: 08/13/19  4:10 PM   Specimen: Tracheal Aspirate; Respiratory  Result Value Ref Range Status   Specimen Description TRACHEAL ASPIRATE  Final   Special Requests NONE  Final   Gram Stain   Final    FEW WBC PRESENT, PREDOMINANTLY PMN RARE GRAM POSITIVE COCCI RARE GRAM POSITIVE RODS Performed at [facility], [place] [address], [place], [place] [address]    Culture   Final    FEW ENTEROBACTER CLOACAE FEW STAPHYLOCOCCUS AUREUS    Report Status 08/16/2019 FINAL  Final   Organism ID, Bacteria STAPHYLOCOCCUS AUREUS  Final   Organism ID, Bacteria ENTEROBACTER CLOACAE  Final      Susceptibility   Enterobacter cloacae - MIC*    CEFAZOLIN >=64 RESISTANT Resistant     CEFEPIME 8 INTERMEDIATE Intermediate     CEFTAZIDIME >=64 RESISTANT Resistant     CIPROFLOXACIN <=0.25 SENSITIVE Sensitive     GENTAMICIN <=1  SENSITIVE Sensitive     IMIPENEM <=0.25 SENSITIVE Sensitive     TRIMETH/SULFA <=20 SENSITIVE Sensitive     PIP/TAZO >=128 RESISTANT Resistant     * FEW ENTEROBACTER CLOACAE   Staphylococcus aureus - MIC*    CIPROFLOXACIN <=0.5 SENSITIVE Sensitive     ERYTHROMYCIN >=8 RESISTANT Resistant     GENTAMICIN <=0.5 SENSITIVE Sensitive     OXACILLIN <=0.25 SENSITIVE Sensitive     TETRACYCLINE <=1 SENSITIVE Sensitive     VANCOMYCIN <=0.5 SENSITIVE Sensitive     TRIMETH/SULFA <=10 SENSITIVE Sensitive     CLINDAMYCIN RESISTANT Resistant     RIFAMPIN <=0.5 SENSITIVE Sensitive     Inducible Clindamycin POSITIVE Resistant     * FEW STAPHYLOCOCCUS AUREUS  Culture, blood (routine x 2)     Status: None   Collection Time: 08/13/19  4:31 PM   Specimen: BLOOD LEFT ARM  Result Value Ref Range Status   Specimen Description BLOOD LEFT ARM  Final   Special Requests   Final    BOTTLES DRAWN AEROBIC ONLY Blood Culture adequate volume   Culture   Final    NO GROWTH 5 DAYS Performed at [facility], [address] [place], [place] [address]    Report Status 08/18/2019 FINAL  Final  Culture, blood (routine x 2)     Status: None   Collection Time: 08/13/19  4:31 PM   Specimen: BLOOD LEFT ARM  Result Value Ref Range Status   Specimen Description BLOOD LEFT ARM  Final   Special Requests   Final    BOTTLES DRAWN AEROBIC ONLY Blood Culture adequate volume   Culture   Final    NO GROWTH 5 DAYS Performed at [facility], [place] [address] [place], [place] [address]    Report Status 08/18/2019 FINAL  Final    Anti-infectives:  Anti-infectives (From admission, onward)   Start     Dose/Rate Route Frequency Ordered Stop   08/06/19 1000  sulfamethoxazole-trimethoprim (BACTRIM) 200-40 MG/5ML suspension 20 mL     20 mL Per Tube Every 12 hours 08/06/19 0848 08/19/19 2231   08/04/19 1400  ceFEPIme (MAXIPIME) 2 g in sodium chloride 0.9 % 100 mL IVPB  Status:  Discontinued     2 g 200 mL/hr over 30  Minutes Intravenous Every 8 hours 08/04/19 1354 08/06/19 0941   07/24/19 1200  cefTRIAXone (ROCEPHIN) 2 g in sodium chloride 0.9 % 100 mL IVPB     2 g 200 mL/hr over 30 Minutes Intravenous Every 24  hours 07/24/19 1100 07/31/19 1138   07/23/19 0400  vancomycin (VANCOREADY) IVPB 1750 mg/350 mL  Status:  Discontinued     1,750 mg 175 mL/hr over 120 Minutes Intravenous Every 12 hours 07/22/19 1552 07/24/19 1100   07/22/19 1600  vancomycin (VANCOCIN) 2,500 mg in sodium chloride 0.9 % 500 mL IVPB     2,500 mg 250 mL/hr over 120 Minutes Intravenous  Once 07/22/19 1552 07/22/19 2214   07/22/19 1600  ceFEPIme (MAXIPIME) 2 g in sodium chloride 0.9 % 100 mL IVPB  Status:  Discontinued     2 g 200 mL/hr over 30 Minutes Intravenous Every 8 hours 07/22/19 1552 07/24/19 1100   07/05/19 2200  vancomycin (VANCOREADY) IVPB 1750 mg/350 mL  Status:  Discontinued     1,750 mg 175 mL/hr over 120 Minutes Intravenous Every 8 hours 07/05/19 1358 07/08/19 1039   07/05/19 1630  meropenem (MERREM) 1 g in sodium chloride 0.9 % 100 mL IVPB     1 g 200 mL/hr over 30 Minutes Intravenous Every 8 hours 07/05/19 1609 07/11/19 1816   07/05/19 1400  vancomycin (VANCOREADY) IVPB 2000 mg/400 mL     2,000 mg 200 mL/hr over 120 Minutes Intravenous  Once 07/05/19 1358 07/05/19 1644   07/15/2019 0600  ceFAZolin (ANCEF) IVPB 2g/100 mL premix  Status:  Discontinued     2 g 200 mL/hr over 30 Minutes Intravenous Every 8 hours 07/03/19 1201 07/05/19 1609   07/02/19 0845  levofloxacin (LEVAQUIN) IVPB 750 mg  Status:  Discontinued     750 mg 100 mL/hr over 90 Minutes Intravenous Every 24 hours 07/02/19 0831 07/03/19 1201   06/26/2019 1732  bacitracin 50,000 Units in sodium chloride 0.9 % 500 mL irrigation  Status:  Discontinued       As needed 07/01/2019 1733 06/27/2019 1828   06/25/19 0400  vancomycin (VANCOREADY) IVPB 1500 mg/300 mL     1,500 mg 150 mL/hr over 120 Minutes Intravenous Every 12 hours 06/25/19 0121 06/25/19 1811   07/08/2019  2228  bacitracin 50,000 Units in sodium chloride 0.9 % 500 mL irrigation  Status:  Discontinued       As needed 06/30/2019 2229 06/25/19 0005   07/09/2019 2000  ceFAZolin (ANCEF) 3 g in dextrose 5 % 50 mL IVPB     3 g 100 mL/hr over 30 Minutes Intravenous  Once 06/21/2019 1947 07/05/2019 2031      Best Practice/Protocols:  VTE Prophylaxis: Lovenox (prophylaxtic dose) Continous Sedation  Consults: Treatment Team:  Jovita Gamma, MD    Studies:    Events:  Subjective:    Overnight Issues:   Objective:  Vital signs for last 24 hours: Temp:  [98.8 F (37.1 C)-103 F (39.4 C)] 99.2 F (37.3 C) (03/09 0400) Pulse Rate:  [93-174] 95 (03/09 0700) Resp:  [13-42] 13 (03/09 0700) BP: (100-140)/(41-84) 109/58 (03/09 0700) SpO2:  [90 %-100 %] 93 % (03/09 0700) FiO2 (%):  [40 %] 40 % (03/09 0700) Weight:  [110.7 kg] 110.7 kg (03/09 0500)  Hemodynamic parameters for last 24 hours:    Intake/Output from previous day: 03/08 0701 - 03/09 0700 In: 3653.2 [I.V.:1178.2; NG/GT:1760] Out: 2450 [Urine:2200; Stool:250]  Intake/Output this shift: No intake/output data recorded.  Vent settings for last 24 hours: Vent Mode: PRVC FiO2 (%):  [40 %] 40 % Set Rate:  [14 bmp] 14 bmp Vt Set:  [390 mL] 390 mL PEEP:  [5 cmH20] 5 cmH20 Plateau Pressure:  [15 cmH20-17 cmH20] 17 cmH20  Physical Exam:  General: no respiratory distress Neuro: no storming now, contraction RUE HEENT/Neck: trach with wd Resp: clear to auscultation bilaterally CVS: RRR 80s GI: soft, PEG Extremities: no edema  Results for orders placed or performed during the hospital encounter of 07/08/2019 (from the past 24 hour(s))  Glucose, capillary     Status: Abnormal   Collection Time: 08/26/19  8:04 AM  Result Value Ref Range   Glucose-Capillary 119 (H) 70 - 99 mg/dL   Comment 1 Notify RN    Comment 2 Document in Chart   Glucose, capillary     Status: Abnormal   Collection Time: 08/26/19 11:34 AM  Result Value Ref  Range   Glucose-Capillary 114 (H) 70 - 99 mg/dL   Comment 1 Notify RN    Comment 2 Document in Chart   Glucose, capillary     Status: Abnormal   Collection Time: 08/26/19  3:40 PM  Result Value Ref Range   Glucose-Capillary 123 (H) 70 - 99 mg/dL   Comment 1 Notify RN    Comment 2 Document in Chart   Glucose, capillary     Status: Abnormal   Collection Time: 08/26/19  7:48 PM  Result Value Ref Range   Glucose-Capillary 132 (H) 70 - 99 mg/dL  Glucose, capillary     Status: Abnormal   Collection Time: 08/26/19 11:24 PM  Result Value Ref Range   Glucose-Capillary 148 (H) 70 - 99 mg/dL  Glucose, capillary     Status: Abnormal   Collection Time: 08/27/19  3:28 AM  Result Value Ref Range   Glucose-Capillary 154 (H) 70 - 99 mg/dL  Basic metabolic panel     Status: Abnormal   Collection Time: 08/27/19  5:56 AM  Result Value Ref Range   Sodium 146 (H) 135 - 145 mmol/L   Potassium 3.6 3.5 - 5.1 mmol/L   Chloride 107 98 - 111 mmol/L   CO2 28 22 - 32 mmol/L   Glucose, Bld 183 (H) 70 - 99 mg/dL   BUN 18 6 - 20 mg/dL   Creatinine, Ser 0.48 (L) 0.61 - 1.24 mg/dL   Calcium 9.0 8.9 - 10.3 mg/dL   GFR calc non Af Amer >60 >60 mL/min   GFR calc Af Amer >60 >60 mL/min   Anion gap 11 5 - 15  CBC     Status: Abnormal   Collection Time: 08/27/19  5:56 AM  Result Value Ref Range   WBC 7.6 4.0 - 10.5 K/uL   RBC 2.94 (L) 4.22 - 5.81 MIL/uL   Hemoglobin 8.4 (L) 13.0 - 17.0 g/dL   HCT 28.2 (L) 39.0 - 52.0 %   MCV 95.9 80.0 - 100.0 fL   MCH 28.6 26.0 - 34.0 pg   MCHC 29.8 (L) 30.0 - 36.0 g/dL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

## 2019-08-28 LAB — GLUCOSE, CAPILLARY
Glucose-Capillary: 121 mg/dL — ABNORMAL HIGH (ref 70–99)
Glucose-Capillary: 130 mg/dL — ABNORMAL HIGH (ref 70–99)
Glucose-Capillary: 135 mg/dL — ABNORMAL HIGH (ref 70–99)
Glucose-Capillary: 145 mg/dL — ABNORMAL HIGH (ref 70–99)
Glucose-Capillary: 147 mg/dL — ABNORMAL HIGH (ref 70–99)
Glucose-Capillary: 153 mg/dL — ABNORMAL HIGH (ref 70–99)

## 2019-08-28 LAB — TRIGLYCERIDES: Triglycerides: 527 mg/dL — ABNORMAL HIGH (ref <150)

## 2019-08-28 NOTE — TOC Progression Note (Signed)
Transition of Care [facility]) - Progression Note    Patient Details  Name: Jay Cole MRN: [id number] Date of Birth: 1995/07/20  [facility] CM/SW Contact  Oren Section Cleta Alberts, RN Phone Number: 08/28/2019, 1:44 PM  Clinical Narrative:  Faxed updated clinical information and MAR to Tammy at [facility], fax # [phone], per her request.       Expected Discharge Plan: Long Term Acute Care (LTAC) Barriers to Discharge: Continued Medical Work up, Vent Bed not available  Expected Discharge Plan and Services Expected Discharge Plan: [place] (LTAC)   Discharge Planning Services: CM Consult Post Acute Care Choice: Long Term Acute Care (LTAC) Living arrangements for the past 2 months: [place] W. Laquonda Welby, RN, BSN  Trauma/Neuro ICU Case Manager [phone]

## 2019-08-28 NOTE — Progress Notes (Signed)
Family (Mother) visiting. I updated her that there have not been many changes and he continues to neuro storm. I also had her bring nail clippers. She clipped his fingernails and toenails. They were beginning to cause wounds on his palms. Cooling blanket turned back on for increased temperature. Thayer Ohm D

## 2019-08-28 NOTE — Progress Notes (Signed)
Patient ID: Jay Cole, male   DOB: 1995/08/07, 24 y.o.   MRN: [id number]   Continued intermittent social visits and telephone calls during this hospitalization creating space and opportunity for patient's mother to explore her thoughts and feelings regarding her son's current medical situation.     No conscious response to voice or pain.  Continued chronic decerebrate posturing  Spoke to [place] today, she tells me that both she and her family are "doing okay".   Created space and opportunity for [name] to explore her thoughts and feelings regarding her son's current medical situation and his poor prognosis for meaningful recovery.  She is not open for much conversation today.  Emotional support offered.  Family is anticipating a transition of care to a skilled facility in [place] which is approximately a 2-hour drive for family.     Chaplain services are involved      family encouraged to call with questions or concerns  Questions and concerns addressed   Discussed with bedside RN  Palliative medicine team will continue to support holistically  Total time spent on the unit was 15 minutes.  Greater than 50% of the time was spent in counseling and coordination of care.  Wadie Lessen NP  Palliative Medicine Team Team Phone # [phone] Pager [phone]

## 2019-08-28 NOTE — Progress Notes (Signed)
Subjective: Patient continues on vent via tracheostomy.  Because of continued hypothalamic dysfunction, weaning trials have been significantly reduced.  Objective: Vital signs in last 24 hours: Vitals:   08/28/19 0600 08/28/19 0700 08/28/19 0800 08/28/19 0821  BP: 126/77 134/67 140/85   Pulse: (!) 118 (!) 113 (!) 113   Resp: (!) 29 (!) 25 (!) 31   Temp:   (!) 100.7 F (38.2 C)   TempSrc:   Axillary   SpO2: 95% 93% 96% 99%  Weight:      Height:        Intake/Output from previous day: 03/09 0701 - 03/10 0700 In: 2869.5 [I.V.:709.5; NG/GT:2160] Out: 3600 [Urine:3450; Stool:150] Intake/Output this shift: No intake/output data recorded.  Physical Exam: No conscious response to voice or pain.  Continued chronic decerebrate posturing.  CBC Recent Labs    08/27/19 0556  WBC 7.6  HGB 8.4*  HCT 28.2*  PLT 229   BMET Recent Labs    08/26/19 0456 08/27/19 0556  NA 146* 146*  K 4.0 3.6  CL 108 107  CO2 27 28  GLUCOSE 110* 183*  BUN 15 18  CREATININE 0.46* 0.48*  CALCIUM 8.8* 9.0    Assessment/Plan: Continuing supportive care.  Case discussed with Dr. Georganna Skeans from trauma surgery.   Hosie Spangle, MD 08/28/2019, 9:02 AM

## 2019-08-28 NOTE — Progress Notes (Signed)
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 (max dose), ativan, seroquel, bromocriptine for neuro storming. MRI reviewed by Dr. Ellene Route 2/9 suggesting profound brain injury and ex vacuo subdural hygromas that would not provide any clinical benefit if drained. Continue to expect a poor prognosis for meaningful recovery. Discussion with parents regarding MRI results and prognosis by both Dr. Grandville Silos and Dr. Ellene Route 2/9, mother continues to desire aggressive care. Continued  conversations with mother regarding clinical status and lack of improvement by primary trauma team.  Intermittent neuro storming now despite Propofol - will try pentobarbital PRN if needed  Acute hypoxic ventilator dependent respiratory failure with severe ARDS- trach 1/22, PSV trial set off hours of storming - wean as able Multiple abrasions - local wound care, ensure adequate pressure off-loading of scalp over crani site  L1 TVP FX - pain control.   HTN- Propranolol and clonidine for neuro storming  Urinary retention - bethanechol FEN- TF to continue Hypernatremia - on free water, BMET in AM Hypertriglyceridemia - from propofol, check in AM ID - Fevers are central. VTE- SCDs, LMWH  Dispo- ICU, TOC  team working on placement in [place] still wants full support. Critical Care Total Time*: 35 Minutes  Georganna Skeans, MD, MPH, FACS Trauma & General Surgery Use [email or web address] to contact on call provider  08/28/2019  *Care during the described time interval was provided by me. I have reviewed this patient's available data, including medical history, events of note, physical examination and test results as part of my evaluation.  Patient ID: [name], male   DOB: 1995/10/30, 24 y.o.   MRN: [id number]

## 2019-08-29 LAB — BASIC METABOLIC PANEL
Anion gap: 13 (ref 5–15)
BUN: 17 mg/dL (ref 6–20)
CO2: 28 mmol/L (ref 22–32)
Calcium: 9.2 mg/dL (ref 8.9–10.3)
Chloride: 103 mmol/L (ref 98–111)
Creatinine, Ser: 0.35 mg/dL — ABNORMAL LOW (ref 0.61–1.24)
GFR calc Af Amer: >60 mL/min (ref >60)
GFR calc non Af Amer: >60 mL/min (ref >60)
Glucose, Bld: 164 mg/dL — ABNORMAL HIGH (ref 70–99)
Potassium: 3.4 mmol/L — ABNORMAL LOW (ref 3.5–5.1)
Sodium: 144 mmol/L (ref 135–145)

## 2019-08-29 LAB — GLUCOSE, CAPILLARY
Glucose-Capillary: 145 mg/dL — ABNORMAL HIGH (ref 70–99)
Glucose-Capillary: 111 mg/dL — ABNORMAL HIGH (ref 70–99)
Glucose-Capillary: 129 mg/dL — ABNORMAL HIGH (ref 70–99)
Glucose-Capillary: 132 mg/dL — ABNORMAL HIGH (ref 70–99)
Glucose-Capillary: 138 mg/dL — ABNORMAL HIGH (ref 70–99)
Glucose-Capillary: 136 mg/dL — ABNORMAL HIGH (ref 70–99)

## 2019-08-29 LAB — TRIGLYCERIDES: Triglycerides: 484 mg/dL — ABNORMAL HIGH (ref <150)

## 2019-08-29 MED ORDER — POTASSIUM CHLORIDE 20 MEQ/15ML (10%) PO SOLN
40.0000 meq | Freq: Once | ORAL | Status: AC
  Administered 2019-08-29: 40 meq
  Filled 2019-08-29: qty 30

## 2019-08-29 MED ORDER — PRO-STAT SUGAR FREE PO LIQD
30.0000 mL | Freq: Two times a day (BID) | ORAL | Status: DC
  Administered 2019-08-29 – 2019-09-10 (×26): 30 mL
  Filled 2019-08-29 (×26): qty 30

## 2019-08-29 NOTE — TOC Progression Note (Signed)
Transition of Care [facility]) - Progression Note    Patient Details  Name: PROMISE KERIN MRN: [id number] Date of Birth: 09/15/1995  [facility] CM/SW Contact  Ella Bodo, RN Phone Number: 08/29/2019, 4:01 PM  Clinical Narrative: Notified by Lynelle Smoke with [facility] that pt cannot be considered for admission to facility due to continued use of IV sedation.  She states that they will keep pt's referral, should he be able to wean off of IV medications.  Trauma team made aware of this.         Expected Discharge Plan: Long Term Acute Care (LTAC) Barriers to Discharge: Continued Medical Work up, Vent Bed not available  Expected Discharge Plan and Services Expected Discharge Plan: Long Term Acute Care (LTAC)   Discharge Planning Services: CM Consult Post Acute Care Choice: Long Term Acute Care (LTAC) Living arrangements for the past 2 months: [place], RN, BSN  Trauma/Neuro ICU Case Manager [phone]
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
## 2019-08-29 NOTE — Progress Notes (Signed)
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 contusion- s/p decompressive craniectomy by Dr. Ellene Route 1/4, worsened subdural hygroma on repeat CT emergently evacuated on 1/8 with concomitant debridement of devitalized brain, poor GCS despite this and poor prognosis per NSGY. Family meeting 1/12 to discuss [place] and family would like to pursue maximal therapies. On propranolol (max dose), clonidine (max dose), ativan, seroquel,  bromocriptine for neuro storming. MRI reviewed by Dr. Ellene Route 2/9 suggesting profound brain injury and ex vacuo subdural hygromas that would not provide any clinical benefit if drained. Continue to expect a poor prognosis for meaningful recovery. Discussion with parents regarding MRI results and prognosis by both Dr. Grandville Silos and Dr. Ellene Route 2/9, mother continues to desire aggressive care. Continued conversations with mother regarding clinical status and lack of improvement by primary trauma team.  Intermittent neuro storming now despite Propofol - will try  pentobarbital PRN if needed  Acute hypoxic ventilator dependent respiratory failure with severe ARDS- trach 1/22, PSV trials Multiple abrasions - local wound care, ensure adequate pressure off-loading of scalp over crani site  L1 TVP FX - pain control.   HTN- Propranolol and clonidine for neuro storming  Urinary retention - bethanechol FEN- TF to continue, replete hypokalemia, check Mg in AM Hypernatremia - on free water, BMET in AM Hypertriglyceridemia - from propofol, down some ID - Fevers are central. VTE- SCDs, LMWH  Dispo- ICU, TOC team working on placement in [place] vent SNF Family still wants full support. Critical Care Total Time*: 33 Minutes  Georganna Skeans, MD, MPH, FACS Trauma & General Surgery Use [email or web address] to contact on call provider  08/29/2019  *Care during the described time interval was provided by me. I have reviewed this patient's available data, including medical history, events of note, physical examination and test results as part of my evaluation.

## 2019-08-30 LAB — BASIC METABOLIC PANEL
Anion gap: 11 (ref 5–15)
BUN: 15 mg/dL (ref 6–20)
CO2: 28 mmol/L (ref 22–32)
Calcium: 9 mg/dL (ref 8.9–10.3)
Chloride: 105 mmol/L (ref 98–111)
Creatinine, Ser: 0.3 mg/dL — ABNORMAL LOW (ref 0.61–1.24)
GFR calc Af Amer: >60 mL/min (ref >60)
GFR calc non Af Amer: >60 mL/min (ref >60)
Glucose, Bld: 140 mg/dL — ABNORMAL HIGH (ref 70–99)
Potassium: 3.9 mmol/L (ref 3.5–5.1)
Sodium: 144 mmol/L (ref 135–145)

## 2019-08-30 LAB — GLUCOSE, CAPILLARY
Glucose-Capillary: 131 mg/dL — ABNORMAL HIGH (ref 70–99)
Glucose-Capillary: 119 mg/dL — ABNORMAL HIGH (ref 70–99)
Glucose-Capillary: 140 mg/dL — ABNORMAL HIGH (ref 70–99)
Glucose-Capillary: 115 mg/dL — ABNORMAL HIGH (ref 70–99)
Glucose-Capillary: 125 mg/dL — ABNORMAL HIGH (ref 70–99)

## 2019-08-30 LAB — MAGNESIUM: Magnesium: 1.8 mg/dL (ref 1.7–2.4)

## 2019-08-30 NOTE — Progress Notes (Signed)
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 will try pentobarbital PRN if needed  Acute hypoxic ventilator dependent respiratory failure with severe ARDS- trach 1/22, PSV trials Multiple abrasions - local wound care, ensure adequate pressure off-loading of scalp over crani site  L1 TVP FX - pain control.   HTN- Propranolol and clonidine for neuro storming  Urinary retention - bethanechol FEN- TF to continue, labs P now Hypernatremia - on free water, BMET in  AM Hypertriglyceridemia - from propofol, down some ID - Fevers are central. VTE- SCDs, LMWH  Dispo- ICU, TOC team working on placement in [place] vent SNF Family still wants full support. Critical Care Total Time*: 32 Minutes  Georganna Skeans, MD, MPH, FACS Trauma & General Surgery Use [email or web address] to contact on call provider  08/30/2019  *Care during the described time interval was provided by me. I have reviewed this patient's available data, including medical history, events of note, physical examination and test results as part of my evaluation.

## 2019-08-31 LAB — GLUCOSE, CAPILLARY
Glucose-Capillary: 112 mg/dL — ABNORMAL HIGH (ref 70–99)
Glucose-Capillary: 116 mg/dL — ABNORMAL HIGH (ref 70–99)
Glucose-Capillary: 132 mg/dL — ABNORMAL HIGH (ref 70–99)
Glucose-Capillary: 136 mg/dL — ABNORMAL HIGH (ref 70–99)
Glucose-Capillary: 138 mg/dL — ABNORMAL HIGH (ref 70–99)
Glucose-Capillary: 149 mg/dL — ABNORMAL HIGH (ref 70–99)
Glucose-Capillary: 160 mg/dL — ABNORMAL HIGH (ref 70–99)

## 2019-08-31 LAB — TRIGLYCERIDES: Triglycerides: 587 mg/dL — ABNORMAL HIGH (ref <150)

## 2019-08-31 NOTE — Progress Notes (Signed)
Patient ID: Jay Cole, male   DOB: [birth year], 24 y.o.   MRN: [id number] BP 130/85   Pulse 97   Temp 98.6 F (37 C) (Axillary)   Resp 13   Ht 5\' 7"  (1.702 m)   Wt 109.6 kg   SpO2 99%   BMI 37.84 kg/m  Comatose Diaphoretic Pupils reactive, round regular Decorticate/decerebrate posturing Grave prognosis, no change. No new recommendations

## 2019-08-31 NOTE — Progress Notes (Signed)
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    Report Status 08/14/2019 FINAL  Final  Culture, respiratory (non-expectorated)     Status: None   Collection Time: 08/13/19  4:10 PM   Specimen: Tracheal Aspirate; Respiratory  Result Value Ref Range Status   Specimen Description TRACHEAL ASPIRATE  Final   Special Requests NONE  Final   Gram Stain   Final    FEW WBC PRESENT, PREDOMINANTLY PMN RARE GRAM POSITIVE COCCI RARE GRAM POSITIVE RODS Performed  at [facility], [place] [address] [place], [place] [address]    Culture   Final    FEW ENTEROBACTER CLOACAE FEW STAPHYLOCOCCUS AUREUS    Report Status 08/16/2019 FINAL  Final   Organism ID, Bacteria STAPHYLOCOCCUS AUREUS  Final   Organism ID, Bacteria ENTEROBACTER CLOACAE  Final      Susceptibility   Enterobacter cloacae - MIC*    CEFAZOLIN >=64 RESISTANT Resistant     CEFEPIME 8 INTERMEDIATE Intermediate     CEFTAZIDIME >=64 RESISTANT Resistant     CIPROFLOXACIN <=0.25 SENSITIVE Sensitive     GENTAMICIN <=1 SENSITIVE Sensitive     IMIPENEM <=0.25 SENSITIVE Sensitive     TRIMETH/SULFA <=20 SENSITIVE Sensitive     PIP/TAZO >=128 RESISTANT Resistant     * FEW ENTEROBACTER CLOACAE   Staphylococcus aureus - MIC*    CIPROFLOXACIN <=0.5 SENSITIVE Sensitive     ERYTHROMYCIN >=8 RESISTANT Resistant     GENTAMICIN <=0.5 SENSITIVE Sensitive     OXACILLIN <=0.25 SENSITIVE Sensitive  TETRACYCLINE <=1 SENSITIVE Sensitive     VANCOMYCIN <=0.5 SENSITIVE Sensitive     TRIMETH/SULFA <=10 SENSITIVE Sensitive     CLINDAMYCIN RESISTANT Resistant     RIFAMPIN <=0.5 SENSITIVE Sensitive     Inducible Clindamycin POSITIVE Resistant     * FEW STAPHYLOCOCCUS AUREUS  Culture, blood (routine x 2)     Status: None   Collection Time: 08/13/19  4:31 PM   Specimen: BLOOD LEFT ARM  Result Value Ref Range Status   Specimen Description BLOOD LEFT ARM  Final   Special Requests   Final    BOTTLES DRAWN AEROBIC ONLY Blood Culture adequate volume   Culture   Final    NO GROWTH 5 DAYS Performed at [facility], [address], [place], [place] [address]    Report Status 08/18/2019 FINAL  Final  Culture, blood (routine x 2)     Status: None   Collection Time: 08/13/19  4:31 PM   Specimen: BLOOD LEFT ARM  Result Value Ref Range Status   Specimen Description BLOOD LEFT ARM  Final   Special Requests   Final    BOTTLES DRAWN AEROBIC ONLY Blood Culture adequate volume   Culture   Final    NO  GROWTH 5 DAYS Performed at [facility], [place] [address] [place], [place] [address]    Report Status 08/18/2019 FINAL  Final

## 2019-09-01 LAB — GLUCOSE, CAPILLARY
Glucose-Capillary: 123 mg/dL — ABNORMAL HIGH (ref 70–99)
Glucose-Capillary: 128 mg/dL — ABNORMAL HIGH (ref 70–99)
Glucose-Capillary: 110 mg/dL — ABNORMAL HIGH (ref 70–99)
Glucose-Capillary: 157 mg/dL — ABNORMAL HIGH (ref 70–99)
Glucose-Capillary: 122 mg/dL — ABNORMAL HIGH (ref 70–99)
Glucose-Capillary: 123 mg/dL — ABNORMAL HIGH (ref 70–99)

## 2019-09-01 LAB — TRIGLYCERIDES: Triglycerides: 693 mg/dL — ABNORMAL HIGH (ref <150)

## 2019-09-01 NOTE — Progress Notes (Signed)
Patient ID: Jay Cole, male   DOB: 1995-11-24, 24 y.o.   MRN: [id number] BP 128/78 (BP Location: Left Arm)   Pulse (!) 155   Temp 98.8 F (37.1 C) (Axillary)   Resp (!) 35   Ht 5\' 7"  (1.702 m)   Wt 110.2 kg   SpO2 97%   BMI 38.05 kg/m  Comatose, in coma vigil No change neurologically

## 2019-09-01 NOTE — Progress Notes (Signed)
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 08/16/2019 FINAL  Final   Organism ID, Bacteria STAPHYLOCOCCUS AUREUS  Final   Organism ID, Bacteria ENTEROBACTER CLOACAE  Final      Susceptibility   Enterobacter cloacae - MIC*    CEFAZOLIN >=64 RESISTANT Resistant     CEFEPIME 8 INTERMEDIATE Intermediate     CEFTAZIDIME >=64 RESISTANT Resistant     CIPROFLOXACIN <=0.25 SENSITIVE Sensitive     GENTAMICIN <=1 SENSITIVE Sensitive     IMIPENEM <=0.25 SENSITIVE Sensitive      TRIMETH/SULFA <=20 SENSITIVE Sensitive     PIP/TAZO >=128 RESISTANT Resistant     * FEW ENTEROBACTER CLOACAE   Staphylococcus aureus - MIC*    CIPROFLOXACIN <=0.5 SENSITIVE Sensitive     ERYTHROMYCIN >=8 RESISTANT Resistant     GENTAMICIN <=0.5 SENSITIVE Sensitive     OXACILLIN <=0.25 SENSITIVE Sensitive     TETRACYCLINE <=1 SENSITIVE Sensitive     VANCOMYCIN <=0.5 SENSITIVE Sensitive     TRIMETH/SULFA <=10 SENSITIVE Sensitive     CLINDAMYCIN RESISTANT Resistant     RIFAMPIN <=0.5 SENSITIVE Sensitive     Inducible Clindamycin POSITIVE Resistant     * FEW STAPHYLOCOCCUS AUREUS  Culture, blood (routine x 2)     Status: None   Collection Time: 08/13/19  4:31 PM   Specimen: BLOOD LEFT ARM  Result Value Ref Range Status   Specimen Description BLOOD LEFT ARM  Final   Special Requests   Final    BOTTLES DRAWN AEROBIC ONLY Blood Culture adequate volume   Culture   Final    NO GROWTH  5 DAYS Performed at [facility], [place] [address] [place], [place] [address]    Report Status 08/18/2019 FINAL  Final  Culture, blood (routine x 2)     Status: None   Collection Time: 08/13/19  4:31 PM   Specimen: BLOOD LEFT ARM  Result Value Ref Range Status   Specimen Description BLOOD LEFT ARM  Final   Special Requests   Final    BOTTLES DRAWN AEROBIC ONLY Blood Culture adequate volume   Culture   Final    NO GROWTH 5 DAYS Performed at [facility], [place] [address] [place],  [address]    Report Status 08/18/2019 FINAL  Final

## 2019-09-02 DIAGNOSIS — R627 Adult failure to thrive: Secondary | ICD-10-CM

## 2019-09-02 LAB — CBC
HCT: 36.4 % — ABNORMAL LOW (ref 39.0–52.0)
Hemoglobin: 10.7 g/dL — ABNORMAL LOW (ref 13.0–17.0)
MCH: 27.8 pg (ref 26.0–34.0)
MCHC: 29.4 g/dL — ABNORMAL LOW (ref 30.0–36.0)
MCV: 94.5 fL (ref 80.0–100.0)
Platelets: 319 10*3/uL (ref 150–400)
RBC: 3.85 MIL/uL — ABNORMAL LOW (ref 4.22–5.81)
RDW: 18.6 % — ABNORMAL HIGH (ref 11.5–15.5)
WBC: 8.5 10*3/uL (ref 4.0–10.5)
nRBC: 0 % (ref 0.0–0.2)

## 2019-09-02 LAB — BASIC METABOLIC PANEL
Anion gap: 13 (ref 5–15)
BUN: 21 mg/dL — ABNORMAL HIGH (ref 6–20)
CO2: 30 mmol/L (ref 22–32)
Calcium: 9.4 mg/dL (ref 8.9–10.3)
Chloride: 101 mmol/L (ref 98–111)
Creatinine, Ser: 0.45 mg/dL — ABNORMAL LOW (ref 0.61–1.24)
GFR calc Af Amer: >60 mL/min (ref >60)
GFR calc non Af Amer: >60 mL/min (ref >60)
Glucose, Bld: 128 mg/dL — ABNORMAL HIGH (ref 70–99)
Potassium: 4.1 mmol/L (ref 3.5–5.1)
Sodium: 144 mmol/L (ref 135–145)

## 2019-09-02 LAB — TRIGLYCERIDES: Triglycerides: 414 mg/dL — ABNORMAL HIGH (ref <150)

## 2019-09-02 LAB — MAGNESIUM: Magnesium: 2.2 mg/dL (ref 1.7–2.4)

## 2019-09-02 LAB — GLUCOSE, CAPILLARY
Glucose-Capillary: 121 mg/dL — ABNORMAL HIGH (ref 70–99)
Glucose-Capillary: 156 mg/dL — ABNORMAL HIGH (ref 70–99)
Glucose-Capillary: 129 mg/dL — ABNORMAL HIGH (ref 70–99)
Glucose-Capillary: 130 mg/dL — ABNORMAL HIGH (ref 70–99)
Glucose-Capillary: 123 mg/dL — ABNORMAL HIGH (ref 70–99)
Glucose-Capillary: 117 mg/dL — ABNORMAL HIGH (ref 70–99)
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
## 2019-09-02 NOTE — Progress Notes (Signed)
Patient ID: Jay Cole, male   DOB: 04-Dec-1995, 24 y.o.   MRN: [id number]   Continued intermittent social visits and telephone calls during this hospitalization creating space and opportunity for patient's mother to explore her thoughts and feelings regarding her son's current medical situation.     No conscious response to voice or pain.  Continued chronic decerebrate posturing.  Currently on trach collar  Discussed case with case Physiological scientist and nursing staff. Discussed case with Dr. Jamas Lav and her concerns regarding importance of putting parameters around escalation of care.   Discussed futility policy briefly.  Spoke to [place] today, she tells me that both she and her family are "doing okay".  Emotional support offered.  We discussed the importance of meeting again in person to have a continued conversation regarding current medical situation;  treatment option decisions, advanced directive decisions and anticipatory care needs.  She agrees to meet with me tomorrow at noon  Questions and concerns addressed   Discussed with bedside RN  Palliative medicine team will continue to support holistically  Total time spent on the unit was 15 minutes.  Greater than 50% of the time was spent in counseling and coordination of care.  Wadie Lessen NP  Palliative Medicine Team Team Phone # [phone] Pager [phone]

## 2019-09-03 DIAGNOSIS — Z7189 Other specified counseling: Secondary | ICD-10-CM

## 2019-09-03 LAB — GLUCOSE, CAPILLARY
Glucose-Capillary: 104 mg/dL — ABNORMAL HIGH (ref 70–99)
Glucose-Capillary: 124 mg/dL — ABNORMAL HIGH (ref 70–99)
Glucose-Capillary: 126 mg/dL — ABNORMAL HIGH (ref 70–99)
Glucose-Capillary: 134 mg/dL — ABNORMAL HIGH (ref 70–99)
Glucose-Capillary: 136 mg/dL — ABNORMAL HIGH (ref 70–99)
Glucose-Capillary: 140 mg/dL — ABNORMAL HIGH (ref 70–99)

## 2019-09-03 LAB — TRIGLYCERIDES: Triglycerides: 538 mg/dL — ABNORMAL HIGH (ref <150)

## 2019-09-03 NOTE — Progress Notes (Addendum)
Patient ID: Jay Cole, male   DOB: 08-18-1995, 24 y.o.   MRN: [id number] I met with Jay Cole's parents along with Stanton Kidney from [place] and Almyra Free from the transitions of care team.  I discussed his current progress and the fact that he remains comatose despite all treatment efforts.  He continues on a lot of sedative medications to control his storming.  He has remained off the ventilator for 5 days or so.  We discussed goals of care.  I made the recommendation that he be made DNR and to make the decision to not put him back on the ventilator should he develop worsening respiratory failure.  Neither of these interventions would do anything to help his brain recover any further which, sadly, it has not.  His parents agreed to making him a DNR.  They want to further think about whether they would want to put him back on the ventilator.  Appreciate the ongoing help from Palliative Care. Orders written for limited code (place on ventilator) only.  Georganna Skeans, MD, MPH, FACS Please use [email or web address] to contact on call provider

## 2019-09-03 NOTE — Progress Notes (Signed)
Patient ID: Jay Cole, male   DOB: 1996/05/22, 24 y.o.   MRN: [id number]   Intermittent social vistis during this hospitalization creating space and opportunity for patient's mother to explore her thoughts and feelings regarding her son's current medical situation and attempting to build a trusting relationship.  Today  this NP visited patient at the bedside to meet with mother as scheduled.  Patient's husband Jay Cole is with her.  Continued conversation regarding current medical situation; information/education on the seriousness of his brain injury and the poor prognosis  for  meaningful recovery offered.  We discussed the limitations of medical interventions to prolong quality of life when the body fails to thrive.   Specifically addressed resuscitative attempts.  Patient's mother was able to verbalize that she would not want [name] to have shock, or compression if his heart should stop.   Patient has been successful on trach collar for several days now. Dr. Grandville Silos was able to verbalize his recommendation recommendation that patient not to be placed back on the vent in the future.  Patient's mother was unable agree with this at this time.  Dr. Grandville Silos documented partial CODE STATUS.  Explored with mother and father if they  have any thoughts or  sense of knowing that the current aggressive medical interventions may not be in his best interest.  Mother tells me "I know and I believe that God will have the final say, I believe that he just needs more time for his brain to rest and heal"  Mother states "the doctor told me last week that my son would NEVER get off the ventilator and look at him now"  Emotional support offered      Chaplain  services are involved  Questions and concerns addressed     Palliative medicine team will continue to support holistically  Total time spent on the unit was 45 minutes     Discussed with Dr. Grandville Silos and bedside RN  Greater than 50% of the time was  spent in counseling and coordination of care  Wadie Lessen NP  Palliative Medicine Team Team Phone # [phone] Pager [phone]

## 2019-09-03 NOTE — Progress Notes (Signed)
Patient ID: Jay Cole, male   DOB: 1995-11-29, 24 y.o.   MRN: [id number] Efforts of palliative care and notes of Dr. Georganna Skeans and recent meetings with family have been reviewed.  Forward progress is being made with the patient now limited CODE BLUE.  It is encouraging that he has been off the ventilator for 5 days.  Nonetheless he still requires substantial sedation to prevent neuro storming.  Continue supportive care for now.

## 2019-09-03 NOTE — Progress Notes (Signed)
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 conversations with mother regarding clinical status and lack of improvement by primary trauma team. Refractory neurostorming - on propranolol (max dose), clonidine (max dose), ativan, seroquel, bromocriptine, propofol, fentanyl gtt, with pentobarbitol added PRN and is still neuro storming.  Acute hypoxic ventilator dependent respiratory failure with severe ARDS- trach 1/22, doing well on trach collar Multiple abrasions - local wound care, ensure adequate pressure  off-loading of scalp over crani site  L1 TVP FX- pain control Urinary retention- bethanechol FEN- TF to continue, vitamins/mineral supplementation.   Hyperglycemia  - SSI Hypertriglyceridemia - from propofol, monitor VTE- SCDs, LMWH  Dispo- ICU, TOC team working on placement in [place] vent SNF. Remains comatose. Meeting today at 6 with [place] and his mother.  Critical Care Total Time*: 31 Minutes  Georganna Skeans, MD, MPH, FACS Trauma & General Surgery Use [email or web address] to contact on call provider  09/03/2019  *Care during the described time interval was provided by me. I have reviewed this patient's available data, including medical history, events of note, physical examination and test results as part of my evaluation.

## 2019-09-04 LAB — BASIC METABOLIC PANEL
Anion gap: 13 (ref 5–15)
BUN: 17 mg/dL (ref 6–20)
CO2: 28 mmol/L (ref 22–32)
Calcium: 9.3 mg/dL (ref 8.9–10.3)
Chloride: 103 mmol/L (ref 98–111)
Creatinine, Ser: 0.36 mg/dL — ABNORMAL LOW (ref 0.61–1.24)
GFR calc Af Amer: >60 mL/min (ref >60)
GFR calc non Af Amer: >60 mL/min (ref >60)
Glucose, Bld: 108 mg/dL — ABNORMAL HIGH (ref 70–99)
Potassium: 3.9 mmol/L (ref 3.5–5.1)
Sodium: 144 mmol/L (ref 135–145)

## 2019-09-04 LAB — GLUCOSE, CAPILLARY
Glucose-Capillary: 133 mg/dL — ABNORMAL HIGH (ref 70–99)
Glucose-Capillary: 104 mg/dL — ABNORMAL HIGH (ref 70–99)
Glucose-Capillary: 124 mg/dL — ABNORMAL HIGH (ref 70–99)
Glucose-Capillary: 116 mg/dL — ABNORMAL HIGH (ref 70–99)
Glucose-Capillary: 139 mg/dL — ABNORMAL HIGH (ref 70–99)
Glucose-Capillary: 131 mg/dL — ABNORMAL HIGH (ref 70–99)

## 2019-09-04 LAB — TRIGLYCERIDES: Triglycerides: 601 mg/dL — ABNORMAL HIGH (ref <150)

## 2019-09-04 MED ORDER — PHENOBARBITAL 60 MG/ML ORAL SUSPENSION: 60.0000 mg | Freq: Four times a day (QID) | ORAL | Status: DC

## 2019-09-04 MED ORDER — PHENOBARBITAL 20 MG/5ML PO ELIX
60.0000 mg | ORAL_SOLUTION | Freq: Four times a day (QID) | ORAL | Status: DC
  Administered 2019-09-04 – 2019-09-11 (×27): 60 mg
  Filled 2019-09-04 (×27): qty 15

## 2019-09-04 MED ORDER — BROMOCRIPTINE MESYLATE 2.5 MG PO TABS
2.5000 mg | ORAL_TABLET | Freq: Two times a day (BID) | ORAL | Status: DC
  Administered 2019-09-04 – 2019-09-06 (×4): 2.5 mg
  Filled 2019-09-04 (×4): qty 1

## 2019-09-04 MED ORDER — PHENOBARBITAL 20 MG/5ML PO ELIX: 60.0000 mg | ORAL_SOLUTION | Freq: Four times a day (QID) | ORAL | Status: DC

## 2019-09-04 NOTE — Progress Notes (Signed)
Patient ID: [name], male   DOB: 01/31/1996, 24 y.o.   MRN: [id number] Storming under fair control with fentanyl and propofol.  Perhaps a fentanyl patch could be appropriate at some point?
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
## 2019-09-05 LAB — TRIGLYCERIDES: Triglycerides: 1126 mg/dL — ABNORMAL HIGH (ref <150)

## 2019-09-05 LAB — GLUCOSE, CAPILLARY
Glucose-Capillary: 129 mg/dL — ABNORMAL HIGH (ref 70–99)
Glucose-Capillary: 102 mg/dL — ABNORMAL HIGH (ref 70–99)
Glucose-Capillary: 124 mg/dL — ABNORMAL HIGH (ref 70–99)
Glucose-Capillary: 126 mg/dL — ABNORMAL HIGH (ref 70–99)
Glucose-Capillary: 112 mg/dL — ABNORMAL HIGH (ref 70–99)
Glucose-Capillary: 121 mg/dL — ABNORMAL HIGH (ref 70–99)

## 2019-09-05 MED ORDER — ACETAMINOPHEN 160 MG/5ML PO SOLN
650.0000 mg | Freq: Four times a day (QID) | ORAL | Status: DC
  Administered 2019-09-05 – 2019-09-11 (×26): 650 mg
  Filled 2019-09-05 (×27): qty 20.3
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
## 2019-09-05 NOTE — Progress Notes (Signed)
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 with severe ARDS- trach 1/22, doing well on trach collar Multiple abrasions - local wound care, ensure adequate pressure off-loading of scalp over crani site  L1 TVP FX- pain control Urinary retention- resolved, D/C urecholine FEN- TF to continue, vitamins/mineral supplementation.   Hyperglycemia  - SSI Hypertriglyceridemia - from propofol, going up, try to wean off propofol VTE- SCDs, LMWH  Dispo- ICU, TOC team working on placement in [place] Total Time*: [place]  Georganna Skeans, MD, MPH, FACS Trauma & General Surgery Use [email or web address] to contact on call provider  09/05/2019  *Care during the described time interval was provided by me. I have reviewed this patient's available data, including medical history, events of note, physical examination and test results as part of my evaluation.

## 2019-09-06 LAB — GLUCOSE, CAPILLARY
Glucose-Capillary: 110 mg/dL — ABNORMAL HIGH (ref 70–99)
Glucose-Capillary: 108 mg/dL — ABNORMAL HIGH (ref 70–99)
Glucose-Capillary: 138 mg/dL — ABNORMAL HIGH (ref 70–99)
Glucose-Capillary: 119 mg/dL — ABNORMAL HIGH (ref 70–99)
Glucose-Capillary: 150 mg/dL — ABNORMAL HIGH (ref 70–99)
Glucose-Capillary: 103 mg/dL — ABNORMAL HIGH (ref 70–99)

## 2019-09-06 LAB — BASIC METABOLIC PANEL
Anion gap: 14 (ref 5–15)
BUN: 16 mg/dL (ref 6–20)
CO2: 27 mmol/L (ref 22–32)
Calcium: 9.9 mg/dL (ref 8.9–10.3)
Chloride: 101 mmol/L (ref 98–111)
Creatinine, Ser: 0.42 mg/dL — ABNORMAL LOW (ref 0.61–1.24)
GFR calc Af Amer: >60 mL/min (ref >60)
GFR calc non Af Amer: >60 mL/min (ref >60)
Glucose, Bld: 127 mg/dL — ABNORMAL HIGH (ref 70–99)
Potassium: 4.2 mmol/L (ref 3.5–5.1)
Sodium: 142 mmol/L (ref 135–145)

## 2019-09-06 LAB — TRIGLYCERIDES: Triglycerides: 601 mg/dL — ABNORMAL HIGH (ref <150)

## 2019-09-06 MED ORDER — METOPROLOL TARTRATE 5 MG/5ML IV SOLN
10.0000 mg | Freq: Once | INTRAVENOUS | Status: AC
  Administered 2019-09-06: 23:00:00 10 mg via INTRAVENOUS
  Filled 2019-09-06: qty 10

## 2019-09-06 MED ORDER — BROMOCRIPTINE MESYLATE 2.5 MG PO TABS
1.2500 mg | ORAL_TABLET | Freq: Two times a day (BID) | ORAL | Status: AC
  Administered 2019-09-06 – 2019-09-08 (×4): 1.25 mg
  Filled 2019-09-06 (×4): qty 1

## 2019-09-06 MED ORDER — LORAZEPAM 1 MG PO TABS
1.0000 mg | ORAL_TABLET | Freq: Three times a day (TID) | ORAL | Status: DC
  Administered 2019-09-06 – 2019-09-08 (×5): 1 mg
  Filled 2019-09-06 (×5): qty 1

## 2019-09-06 MED ORDER — BROMOCRIPTINE MESYLATE 2.5 MG PO TABS: 1.2500 mg | ORAL_TABLET | Freq: Two times a day (BID) | ORAL | Status: DC

## 2019-09-06 NOTE — Progress Notes (Addendum)
2200: patient continuously storming tachypneic with HR sustaining in the 180s made Dr. Bobbye Morton aware. RN Gave PRN metoprolol, oxycodone, ativan. New one time order for 10mg  of metoprolol IV. Will continue to monitor pt.  0000: Pt is diaphoretic with respirations ranging from 50s-70s. Oxygen saturations began dropping to high 80s on trach collar. Tried suctioning patient's trach retrieving minimal secretions. Latest Temp is 103.4 HR still sustaining in 180s-190s.  Paged Dr. Bobbye Morton making her aware of patients current status. One time dose for 10mg  of IV metoprolol re ordered.

## 2019-09-06 NOTE — Progress Notes (Addendum)
Per Dr. Elyn Peers order, Propofol tapered and d/ced at 1850.     200 mL fentanyl wasted with Katherine Mantle RN

## 2019-09-06 NOTE — TOC Progression Note (Signed)
Transition of Care [facility]) - Progression Note    Patient Details  Name: Jay Cole MRN: [id number] Date of Birth: 1996/04/18  [facility] CM/SW Contact  Ella Bodo, RN Phone Number: 09/06/2019, 5:07 PM  Clinical Narrative:   Pt now on trach collar and IV sedation being weaned.  Pt's neurostorming events seem to be improving. [place] had stated they would consider reevaluating pt for admission once his neurostorming has improved.  Left message with [facility] in [place],  and faxed clinicals, demographic information.    Contact for [facility]:  Chante/Pam Phone:  [phone] Fax:  [phone]      Expected Discharge Plan: Long Term Acute Care (LTAC) Barriers to Discharge: Continued Medical Work up, Vent Bed not available  Expected Discharge Plan and Services Expected Discharge Plan: Long Term Acute Care (LTAC)   Discharge Planning Services: CM Consult Post Acute Care Choice: Long Term Acute Care (LTAC) Living arrangements for the past 2 months: Single Family Home                                       Social Determinants of Health (SDOH) Interventions    Readmission Risk Interventions No flowsheet data found.  Reinaldo Raddle, RN, BSN  Trauma/Neuro ICU Case Manager [phone]

## 2019-09-06 NOTE — Progress Notes (Signed)
Jay Cole's mother arrived to the hospital and was extremely agitated to find a DNR bracelet on Jay Cole's wrist. The nurse for the day explained that the bracelet was not indicative of a full code, rather partial code status as was discussed and documented on 3/16 with Dr. Grandville Silos and the palliative care team. The patient's mother continued to be visibly upset and speaking in an elevated volume to the nurse, so I came to discuss with her. She stated she "never made that decision" and that she "told everyone I needed more time." She was adamant that "y'all need to do everything y'all can to keep my baby alive." I did explain to her that while I was not present for the discussion on 3/16, Dr. Grandville Silos and Jay Cole from Palliative, independently documented their understanding from the discussion and that understanding was that Jay Cole should be placed back on the ventilator if needed, but no chest compressions, defibrillation, or vasopressor/anti-arrhythmics would be performed/administered. She was insistent that these were not her wishes and upon inquiry of each, stated that she would like maximal resuscitative efforts performed. I attempted to explore what information she would need to help make this decision and she stated she was told it would take 3 to 6 months for him to recover. I clearly explained that when patients are given that kind of recovery time, they do not suddenly recover at that time point, there is some progression of recovery over that time frame, which Jay Cole has not demonstrated. She stated she felt Jay Cole is too medicated to be confident of his current neurologic function. We discussed peeling back/off sedating agents in an effort to better delineate this and she was agreeable to this plan. I explained that some of the medications he is on would require a taper and that discontinuation of agents, even with a taper, may precipitate additional neurostorming events, to which she verbalized understanding. I informed  her we would start by discontinuing propofol by the end of the day today and begin weaning the ativan. Will update NSGY to discuss again with her next week. Code status updated electronically to revert back to full code.   Jay Oka, MD General and [place] [facility]
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
## 2019-09-07 LAB — GLUCOSE, CAPILLARY
Glucose-Capillary: 156 mg/dL — ABNORMAL HIGH (ref 70–99)
Glucose-Capillary: 108 mg/dL — ABNORMAL HIGH (ref 70–99)
Glucose-Capillary: 113 mg/dL — ABNORMAL HIGH (ref 70–99)
Glucose-Capillary: 117 mg/dL — ABNORMAL HIGH (ref 70–99)
Glucose-Capillary: 147 mg/dL — ABNORMAL HIGH (ref 70–99)
Glucose-Capillary: 165 mg/dL — ABNORMAL HIGH (ref 70–99)

## 2019-09-07 LAB — C DIFFICILE QUICK SCREEN W PCR REFLEX
C Diff antigen: NEGATIVE
C Diff interpretation: NOT DETECTED
C Diff toxin: NEGATIVE

## 2019-09-07 MED ORDER — DIAZEPAM 5 MG/ML IJ SOLN
10.0000 mg | Freq: Once | INTRAMUSCULAR | Status: AC
  Administered 2019-09-07: 10 mg via INTRAVENOUS
  Filled 2019-09-07: qty 2

## 2019-09-07 MED ORDER — DEXMEDETOMIDINE HCL IN NACL 400 MCG/100ML IV SOLN
0.4000 ug/kg/h | INTRAVENOUS | Status: DC
  Administered 2019-09-07: 0.4 ug/kg/h via INTRAVENOUS
  Filled 2019-09-07: qty 100

## 2019-09-07 MED ORDER — LACTATED RINGERS IV BOLUS
1000.0000 mL | Freq: Once | INTRAVENOUS | Status: AC
  Administered 2019-09-07: 1000 mL via INTRAVENOUS

## 2019-09-07 MED ORDER — METOPROLOL TARTRATE 5 MG/5ML IV SOLN
10.0000 mg | Freq: Once | INTRAVENOUS | Status: AC
  Administered 2019-09-07: 10 mg via INTRAVENOUS

## 2019-09-07 NOTE — Progress Notes (Addendum)
0200: Patient still has HR in 180s BP is 88/32 after receiving metoprolol. Paged Dr. Bobbye Morton. New verbal order to give 2 liter Lactated ringer fluid bolus.    0300: Made Dr. Bobbye Morton aware there are no changes with patient HR. New Verbal order for Precedex gtt and 10 mg IV valium

## 2019-09-07 NOTE — Progress Notes (Signed)
Called to room by staff due to mom yelling in room because she was upset to find a DNR bracelet on son. Per mom "she never made that decision". I explained to her that in the system he is listed as a partial code following her conversation with the trauma team on Tuesday, but she denies making this decision. She said she "told everyone she needed more time" and she couldn't understand why he was now a DNR. I again tried to explain to her that we were in no way not treating him, and that the chart reflected a partial code meaning we would ventilate him if needed through his trach. She said she knew "what it meant to be a DNR and she never made that decision". At this point I paged Dr Bobbye Morton to the bedside to discuss with mom further.
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
## 2019-09-08 LAB — URINALYSIS, ROUTINE W REFLEX MICROSCOPIC
Bacteria, UA: NONE SEEN
Bilirubin Urine: NEGATIVE
Glucose, UA: NEGATIVE mg/dL
Hgb urine dipstick: NEGATIVE
Ketones, ur: NEGATIVE mg/dL
Leukocytes,Ua: NEGATIVE
Nitrite: NEGATIVE
Protein, ur: 30 mg/dL — AB
Specific Gravity, Urine: 1.025 (ref 1.005–1.030)
pH: 5 (ref 5.0–8.0)

## 2019-09-08 LAB — GLUCOSE, CAPILLARY
Glucose-Capillary: 157 mg/dL — ABNORMAL HIGH (ref 70–99)
Glucose-Capillary: 177 mg/dL — ABNORMAL HIGH (ref 70–99)
Glucose-Capillary: 201 mg/dL — ABNORMAL HIGH (ref 70–99)
Glucose-Capillary: 165 mg/dL — ABNORMAL HIGH (ref 70–99)
Glucose-Capillary: 165 mg/dL — ABNORMAL HIGH (ref 70–99)
Glucose-Capillary: 190 mg/dL — ABNORMAL HIGH (ref 70–99)

## 2019-09-08 LAB — CBC
HCT: 44.7 % (ref 39.0–52.0)
Hemoglobin: 13.5 g/dL (ref 13.0–17.0)
MCH: 28.4 pg (ref 26.0–34.0)
MCHC: 30.2 g/dL (ref 30.0–36.0)
MCV: 94.1 fL (ref 80.0–100.0)
Platelets: 132 10*3/uL — ABNORMAL LOW (ref 150–400)
RBC: 4.75 MIL/uL (ref 4.22–5.81)
RDW: 19.5 % — ABNORMAL HIGH (ref 11.5–15.5)
WBC: 18.3 10*3/uL — ABNORMAL HIGH (ref 4.0–10.5)
nRBC: 0.2 % (ref 0.0–0.2)

## 2019-09-08 MED ORDER — QUETIAPINE FUMARATE 200 MG PO TABS
400.0000 mg | ORAL_TABLET | Freq: Two times a day (BID) | ORAL | Status: DC
  Administered 2019-09-08 – 2019-09-09 (×2): 400 mg
  Filled 2019-09-08 (×2): qty 2

## 2019-09-08 MED ORDER — LORAZEPAM 1 MG PO TABS
1.0000 mg | ORAL_TABLET | Freq: Two times a day (BID) | ORAL | Status: DC
  Administered 2019-09-08 – 2019-09-09 (×3): 1 mg
  Filled 2019-09-08 (×3): qty 1
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
## 2019-09-08 NOTE — Progress Notes (Signed)
Patient ID: Jay Cole, male   DOB: 30-May-1996, 24 y.o.   MRN: [id number] Remains comatose with stable  Continue supportive care no new neurosurgical changes

## 2019-09-09 LAB — CBC
HCT: 42 % (ref 39.0–52.0)
Hemoglobin: 12.8 g/dL — ABNORMAL LOW (ref 13.0–17.0)
MCH: 28.7 pg (ref 26.0–34.0)
MCHC: 30.5 g/dL (ref 30.0–36.0)
MCV: 94.2 fL (ref 80.0–100.0)
Platelets: 117 10*3/uL — ABNORMAL LOW (ref 150–400)
RBC: 4.46 MIL/uL (ref 4.22–5.81)
RDW: 19.9 % — ABNORMAL HIGH (ref 11.5–15.5)
WBC: 15 10*3/uL — ABNORMAL HIGH (ref 4.0–10.5)
nRBC: 0.1 % (ref 0.0–0.2)

## 2019-09-09 LAB — GLUCOSE, CAPILLARY
Glucose-Capillary: 160 mg/dL — ABNORMAL HIGH (ref 70–99)
Glucose-Capillary: 180 mg/dL — ABNORMAL HIGH (ref 70–99)
Glucose-Capillary: 154 mg/dL — ABNORMAL HIGH (ref 70–99)
Glucose-Capillary: 146 mg/dL — ABNORMAL HIGH (ref 70–99)
Glucose-Capillary: 167 mg/dL — ABNORMAL HIGH (ref 70–99)
Glucose-Capillary: 120 mg/dL — ABNORMAL HIGH (ref 70–99)

## 2019-09-09 LAB — BASIC METABOLIC PANEL
Anion gap: 13 (ref 5–15)
BUN: 82 mg/dL — ABNORMAL HIGH (ref 6–20)
CO2: 22 mmol/L (ref 22–32)
Calcium: 9.3 mg/dL (ref 8.9–10.3)
Chloride: 112 mmol/L — ABNORMAL HIGH (ref 98–111)
Creatinine, Ser: 0.96 mg/dL (ref 0.61–1.24)
GFR calc Af Amer: >60 mL/min (ref >60)
GFR calc non Af Amer: >60 mL/min (ref >60)
Glucose, Bld: 162 mg/dL — ABNORMAL HIGH (ref 70–99)
Potassium: 3.5 mmol/L (ref 3.5–5.1)
Sodium: 147 mmol/L — ABNORMAL HIGH (ref 135–145)

## 2019-09-09 LAB — MAGNESIUM: Magnesium: 3 mg/dL — ABNORMAL HIGH (ref 1.7–2.4)

## 2019-09-09 LAB — PHOSPHORUS: Phosphorus: 3.2 mg/dL (ref 2.5–4.6)

## 2019-09-09 MED ORDER — QUETIAPINE FUMARATE 200 MG PO TABS
200.0000 mg | ORAL_TABLET | Freq: Three times a day (TID) | ORAL | Status: DC
  Administered 2019-09-09 (×2): 200 mg
  Filled 2019-09-09 (×2): qty 1

## 2019-09-09 NOTE — Care Management (Signed)
Received a call from [facility] with [facility]. They are unable to accept patient.   Magdalen Spatz RN

## 2019-09-09 NOTE — Progress Notes (Signed)
Patient ID: Jay Cole, male   DOB: 1996-02-14, 24 y.o.   MRN: [id number] Follow up - Trauma Critical Care  Patient Details:    Jay Cole is an 24 y.o. male.  Lines/tubes : PICC Double Lumen 06/26/19 PICC Right Brachial 40 cm 0 cm (Active)  Indication for Insertion or Continuance of Line Prolonged intravenous therapies 09/09/19 0800  Exposed Catheter (cm) 0 cm 06/26/19 2000  Site Assessment Clean;Dry;Intact 09/09/19 0800  Lumen #1 Status Flushed;Saline locked 09/09/19 0800  Lumen #2 Status In-line blood sampling system in place;Flushed;Blood return noted 09/09/19 0800  Dressing Type Transparent;Occlusive 09/09/19 0800  Dressing Status Clean;Dry;Intact;Antimicrobial disc in place 09/09/19 0800  Line Care Lumen 1 tubing changed 09/02/19 1700  Line Adjustment (NICU/IV Team Only) No 08/31/19 2000  Dressing Intervention New dressing 09/08/19 2000  Dressing Change Due 09/16/19 09/09/19 0800     Gastrostomy/Enterostomy Percutaneous endoscopic gastrostomy (PEG) 24 Fr. (Active)  Surrounding Skin Dry;Intact 09/09/19 0800  Tube Status Patent 09/09/19 0800  Drainage Appearance None 09/09/19 0800  Catheter Position (cm marking) 6.5 cm 09/02/19 0800  Dressing Status Clean;Dry;Intact 09/09/19 0800  Dressing Intervention New dressing 09/09/19 0800  Dressing Type Split gauze 09/09/19 0800  Dressing Change Due 09/09/19 09/09/19 0800  G Port Intake (mL) 100 ml 09/06/19 1500  J Port Intake (mL) 100 ml 08/12/19 1800  Output (mL) 0 mL 08/24/19 0800     Rectal Tube/Pouch (Active)  Output (mL) 0 mL 09/09/19 0606     External Urinary Catheter (Active)  Collection Container Standard drainage bag 09/09/19 0800  Securement Method Securing device (Describe) 09/09/19 0800  Site Assessment Clean;Intact 09/09/19 0800  Intervention Equipment Changed 09/07/19 0800  Output (mL) 500 mL 09/09/19 0606    Microbiology/Sepsis markers: Results for orders placed or performed during the hospital encounter  of 07/18/2019  Respiratory Panel by RT PCR (Flu A&B, Covid) - Nasopharyngeal Swab     Status: None   Collection Time: 06/28/2019  8:36 PM   Specimen: Nasopharyngeal Swab  Result Value Ref Range Status   SARS Coronavirus 2 by RT PCR NEGATIVE NEGATIVE Final    Comment: (NOTE) SARS-CoV-2 target nucleic acids are NOT DETECTED. The SARS-CoV-2 RNA is generally detectable in upper respiratoy specimens during the acute phase of infection. The lowest concentration of SARS-CoV-2 viral copies this assay can detect is 131 copies/mL. A negative result does not preclude SARS-Cov-2 infection and should not be used as the sole basis for treatment or other patient management decisions. A negative result may occur with  improper specimen collection/handling, submission of specimen other than nasopharyngeal swab, presence of viral mutation(s) within the areas targeted by this assay, and inadequate number of viral copies (<131 copies/mL). A negative result must be combined with clinical observations, patient history, and epidemiological information. The expected result is Negative. Fact Sheet for Patients:  PinkCheek.be Fact Sheet for Healthcare Providers:  GravelBags.it This test is not yet ap proved or cleared by the [place] FDA and  has been authorized for detection and/or diagnosis of SARS-CoV-2 by FDA under an Emergency Use Authorization (EUA). This EUA will remain  in effect (meaning this test can be used) for the duration of the COVID-19 declaration under Section 564(b)(1) of the Act, 21 U.S.C. section 360bbb-3(b)(1), unless the authorization is terminated or revoked sooner.    Influenza A by PCR NEGATIVE NEGATIVE Final   Influenza B by PCR NEGATIVE NEGATIVE Final    Comment: (NOTE) The Xpert Xpress SARS-CoV-2/FLU/RSV assay is intended as an aid  in  the diagnosis of influenza from Nasopharyngeal swab specimens and  should not be used  as a sole basis for treatment. Nasal washings and  aspirates are unacceptable for Xpert Xpress SARS-CoV-2/FLU/RSV  testing. Fact Sheet for Patients: PinkCheek.be Fact Sheet for Healthcare Providers: GravelBags.it This test is not yet approved or cleared by the [place] FDA and  has been authorized for detection and/or diagnosis of SARS-CoV-2 by  FDA under an Emergency Use Authorization (EUA). This EUA will remain  in effect (meaning this test can be used) for the duration of the  Covid-19 declaration under Section 564(b)(1) of the Act, 21  U.S.C. section 360bbb-3(b)(1), unless the authorization is  terminated or revoked. Performed at [facility], [place] [address], [place], [place] [address]   MRSA PCR Screening     Status: None   Collection Time: 06/25/19 12:50 AM   Specimen: Nasal Mucosa; Nasopharyngeal  Result Value Ref Range Status   MRSA by PCR NEGATIVE NEGATIVE Final    Comment:        The GeneXpert MRSA Assay (FDA approved for NASAL specimens only), is one component of a comprehensive MRSA colonization surveillance program. It is not intended to diagnose MRSA infection nor to guide or monitor treatment for MRSA infections. Performed at [facility], [place] [address], [place], [place] [address]   Culture, respiratory (non-expectorated)     Status: None   Collection Time: 07/01/19  7:11 AM   Specimen: Tracheal Aspirate; Respiratory  Result Value Ref Range Status   Specimen Description TRACHEAL ASPIRATE  Final   Special Requests NONE  Final   Gram Stain   Final    FEW WBC PRESENT, PREDOMINANTLY PMN FEW GRAM POSITIVE COCCI IN PAIRS FEW GRAM POSITIVE RODS Performed at [facility], [place] [address] [place], [place] [address]    Culture ABUNDANT STAPHYLOCOCCUS AUREUS  Final   Report Status 07/09/2019 FINAL  Final   Organism ID, Bacteria STAPHYLOCOCCUS AUREUS  Final      Susceptibility    Staphylococcus aureus - MIC*    CIPROFLOXACIN <=0.5 SENSITIVE Sensitive     ERYTHROMYCIN RESISTANT Resistant     GENTAMICIN <=0.5 SENSITIVE Sensitive     OXACILLIN 0.5 SENSITIVE Sensitive     TETRACYCLINE <=1 SENSITIVE Sensitive     VANCOMYCIN <=0.5 SENSITIVE Sensitive     TRIMETH/SULFA <=10 SENSITIVE Sensitive     CLINDAMYCIN RESISTANT Resistant     RIFAMPIN <=0.5 SENSITIVE Sensitive     Inducible Clindamycin POSITIVE Resistant     * ABUNDANT STAPHYLOCOCCUS AUREUS  Culture, blood (routine x 2)     Status: None   Collection Time: 07/01/19  8:45 AM   Specimen: BLOOD  Result Value Ref Range Status   Specimen Description BLOOD LEFT ANTECUBITAL  Final   Special Requests AEROBIC BOTTLE ONLY Blood Culture adequate volume  Final   Culture   Final    NO GROWTH 5 DAYS Performed at [facility], [place] [address], [place], [place] [address]    Report Status 07/06/2019 FINAL  Final  Culture, blood (routine x 2)     Status: None   Collection Time: 07/01/19  8:52 AM   Specimen: BLOOD  Result Value Ref Range Status   Specimen Description BLOOD LEFT ANTECUBITAL  Final   Special Requests AEROBIC BOTTLE ONLY Blood Culture adequate volume  Final   Culture   Final    NO GROWTH 5 DAYS Performed at [place]  [address], [place], [place] [address]    Report Status 07/06/2019 FINAL  Final  Culture, respiratory (non-expectorated)     Status: None   Collection Time: 07/05/19  9:32 AM   Specimen: Tracheal Aspirate; Respiratory  Result Value Ref Range Status   Specimen Description TRACHEAL ASPIRATE  Final   Special Requests NONE  Final   Gram Stain   Final    MODERATE WBC PRESENT,BOTH PMN AND MONONUCLEAR RARE GRAM POSITIVE COCCI FEW GRAM VARIABLE ROD Performed at [facility], [place] [address], [place], [place] [address]    Culture RARE STAPHYLOCOCCUS AUREUS  Final   Report Status 07/08/2019 FINAL  Final   Organism ID, Bacteria STAPHYLOCOCCUS AUREUS  Final       Susceptibility   Staphylococcus aureus - MIC*    CIPROFLOXACIN <=0.5 SENSITIVE Sensitive     ERYTHROMYCIN RESISTANT Resistant     GENTAMICIN <=0.5 SENSITIVE Sensitive     OXACILLIN 0.5 SENSITIVE Sensitive     TETRACYCLINE <=1 SENSITIVE Sensitive     VANCOMYCIN 1 SENSITIVE Sensitive     TRIMETH/SULFA <=10 SENSITIVE Sensitive     CLINDAMYCIN RESISTANT Resistant     RIFAMPIN <=0.5 SENSITIVE Sensitive     Inducible Clindamycin POSITIVE Resistant     * RARE STAPHYLOCOCCUS AUREUS  Culture, blood (routine x 2)     Status: None   Collection Time: 07/05/19 12:00 PM   Specimen: BLOOD  Result Value Ref Range Status   Specimen Description BLOOD LEFT ANTECUBITAL  Final   Special Requests   Final    BOTTLES DRAWN AEROBIC ONLY Blood Culture adequate volume   Culture   Final    NO GROWTH 5 DAYS Performed at [facility], [address] [place], [place] [address]    Report Status 07/10/2019 FINAL  Final  Culture, blood (routine x 2)     Status: None   Collection Time: 07/05/19 12:23 PM   Specimen: BLOOD  Result Value Ref Range Status   Specimen Description BLOOD LEFT ANTECUBITAL  Final   Special Requests   Final    BOTTLES DRAWN AEROBIC ONLY Blood Culture adequate volume   Culture   Final    NO GROWTH 5 DAYS Performed at [facility], [place] [address], [place], [place] [address]    Report Status 07/10/2019 FINAL  Final  Culture, blood (routine x 2)     Status: None   Collection Time: 07/22/19  2:42 PM   Specimen: BLOOD LEFT HAND  Result Value Ref Range Status   Specimen Description BLOOD LEFT HAND  Final   Special Requests   Final    BOTTLES DRAWN AEROBIC ONLY Blood Culture adequate volume   Culture   Final    NO GROWTH 5 DAYS Performed at [facility], [place] [address], [place], [place] [address]    Report Status 07/27/2019 FINAL  Final  Culture, blood (routine x 2)     Status: None   Collection Time: 07/22/19  2:43 PM   Specimen: BLOOD LEFT HAND  Result Value Ref  Range Status   Specimen Description BLOOD LEFT HAND  Final   Special Requests   Final    BOTTLES DRAWN AEROBIC ONLY Blood Culture adequate volume   Culture   Final    NO GROWTH 5 DAYS Performed at [facility], [place] [address], [place], [place] [address]    Report Status 07/27/2019 FINAL  Final  Culture, respiratory (non-expectorated)     Status:  None   Collection Time: 07/22/19  4:13 PM   Specimen: Tracheal Aspirate; Respiratory  Result Value Ref Range Status   Specimen Description TRACHEAL ASPIRATE  Final   Special Requests NONE  Final   Gram Stain   Final    ABUNDANT WBC PRESENT, PREDOMINANTLY PMN ABUNDANT GRAM POSITIVE COCCI FEW GRAM POSITIVE RODS Performed at [facility], [place] [address] [place], [place] [address]    Culture MODERATE STAPHYLOCOCCUS AUREUS  Final   Report Status 07/24/2019 FINAL  Final   Organism ID, Bacteria STAPHYLOCOCCUS AUREUS  Final      Susceptibility   Staphylococcus aureus - MIC*    CIPROFLOXACIN <=0.5 SENSITIVE Sensitive     ERYTHROMYCIN >=8 RESISTANT Resistant     GENTAMICIN <=0.5 SENSITIVE Sensitive     OXACILLIN <=0.25 SENSITIVE Sensitive     TETRACYCLINE <=1 SENSITIVE Sensitive     VANCOMYCIN 1 SENSITIVE Sensitive     TRIMETH/SULFA <=10 SENSITIVE Sensitive     CLINDAMYCIN RESISTANT Resistant     RIFAMPIN <=0.5 SENSITIVE Sensitive     Inducible Clindamycin POSITIVE Resistant     * MODERATE STAPHYLOCOCCUS AUREUS  Culture, respiratory (non-expectorated)     Status: None   Collection Time: 08/04/19  9:25 AM   Specimen: Tracheal Aspirate; Respiratory  Result Value Ref Range Status   Specimen Description TRACHEAL ASPIRATE  Final   Special Requests NONE  Final   Gram Stain   Final    MODERATE WBC PRESENT,BOTH PMN AND MONONUCLEAR MODERATE GRAM POSITIVE COCCI IN CLUSTERS MODERATE GRAM NEGATIVE COCCOBACILLI RARE GRAM NEGATIVE RODS RARE SQUAMOUS EPITHELIAL CELLS PRESENT Performed at [facility], [place] [address],  [place], [place] [address]    Culture   Final    ABUNDANT STAPHYLOCOCCUS AUREUS ABUNDANT ENTEROBACTER CLOACAE    Report Status 08/06/2019 FINAL  Final   Organism ID, Bacteria STAPHYLOCOCCUS AUREUS  Final   Organism ID, Bacteria ENTEROBACTER CLOACAE  Final      Susceptibility   Enterobacter cloacae - MIC*    CEFAZOLIN >=64 RESISTANT Resistant     CEFEPIME 4 INTERMEDIATE Intermediate     CEFTAZIDIME >=64 RESISTANT Resistant     CIPROFLOXACIN <=0.25 SENSITIVE Sensitive     GENTAMICIN <=1 SENSITIVE Sensitive     IMIPENEM <=0.25 SENSITIVE Sensitive     TRIMETH/SULFA <=20 SENSITIVE Sensitive     PIP/TAZO >=128 RESISTANT Resistant     * ABUNDANT ENTEROBACTER CLOACAE   Staphylococcus aureus - MIC*    CIPROFLOXACIN <=0.5 SENSITIVE Sensitive     ERYTHROMYCIN >=8 RESISTANT Resistant     GENTAMICIN <=0.5 SENSITIVE Sensitive     OXACILLIN 0.5 SENSITIVE Sensitive     TETRACYCLINE <=1 SENSITIVE Sensitive     VANCOMYCIN 1 SENSITIVE Sensitive     TRIMETH/SULFA <=10 SENSITIVE Sensitive     CLINDAMYCIN RESISTANT Resistant     RIFAMPIN <=0.5 SENSITIVE Sensitive     Inducible Clindamycin POSITIVE Resistant     * ABUNDANT STAPHYLOCOCCUS AUREUS  Culture, blood (routine x 2)     Status: None   Collection Time: 08/04/19  9:37 AM   Specimen: BLOOD  Result Value Ref Range Status   Specimen Description BLOOD LEFT ANTECUBITAL  Final   Special Requests   Final    BOTTLES DRAWN AEROBIC AND ANAEROBIC Blood Culture results may not be optimal due to an inadequate volume of blood received in culture bottles   Culture   Final    NO GROWTH 5 DAYS Performed at [facility], [address] [place], [place] [address]    Report Status 08/09/2019 FINAL  Final  Culture, blood (routine x 2)     Status: None   Collection Time: 08/04/19  9:50 AM   Specimen: BLOOD LEFT ARM  Result Value Ref Range Status   Specimen Description BLOOD LEFT ARM  Final   Special Requests   Final    BOTTLES DRAWN AEROBIC AND ANAEROBIC  Blood Culture results may not be optimal due to an inadequate volume of blood received in culture bottles   Culture   Final    NO GROWTH 5 DAYS Performed at [facility], [place] [address] [place], [place] [address]    Report Status 08/09/2019 FINAL  Final  C difficile quick scan w PCR reflex     Status: None   Collection Time: 08/04/19  1:55 PM   Specimen: STOOL  Result Value Ref Range Status   C Diff antigen NEGATIVE NEGATIVE Final   C Diff toxin NEGATIVE NEGATIVE Final   C Diff interpretation No C. difficile detected.  Final    Comment: Performed at [facility], [place] [address], [place], [place] [address]  SARS CORONAVIRUS 2 (TAT 6-24 HRS) Nasopharyngeal Nasopharyngeal Swab     Status: None   Collection Time: 08/06/19  9:53 AM   Specimen: Nasopharyngeal Swab  Result Value Ref Range Status   SARS Coronavirus 2 NEGATIVE NEGATIVE Final    Comment: (NOTE) SARS-CoV-2 target nucleic acids are NOT DETECTED. The SARS-CoV-2 RNA is generally detectable in upper and lower respiratory specimens during the acute phase of infection. Negative results do not preclude SARS-CoV-2 infection, do not rule out co-infections with other pathogens, and should not be used as the sole basis for treatment or other patient management decisions. Negative results must be combined with clinical observations, patient history, and epidemiological information. The expected result is Negative. Fact Sheet for Patients: SugarRoll.be Fact Sheet for Healthcare Providers: https://www.woods-mathews.com/ This test is not yet approved or cleared by the [place] FDA and  has been authorized for detection and/or diagnosis of SARS-CoV-2 by FDA under an Emergency Use Authorization (EUA). This EUA will remain  in effect (meaning this test can be used) for the duration of the COVID-19 declaration under Section 56 4(b)(1) of the Act, 21 U.S.C. section 360bbb-3(b)(1),  unless the authorization is terminated or revoked sooner. Performed at [facility], [place] [address], [place], [place] [address]   MRSA PCR Screening     Status: None   Collection Time: 08/08/19  8:54 AM   Specimen: Nasopharyngeal  Result Value Ref Range Status   MRSA by PCR NEGATIVE NEGATIVE Final    Comment:        The GeneXpert MRSA Assay (FDA approved for NASAL specimens only), is one component of a comprehensive MRSA colonization surveillance program. It is not intended to diagnose MRSA infection nor to guide or monitor treatment for MRSA infections. Performed at [facility], [place] [address], [place], [place] [address]   Culture, Urine     Status: None   Collection Time: 08/13/19  4:10 PM   Specimen: Urine, Catheterized  Result Value Ref Range Status   Specimen Description URINE, CATHETERIZED  Final   Special Requests NONE  Final   Culture   Final    NO GROWTH Performed at [facility], [address], [place], [place] [address]    Report Status 08/14/2019 FINAL  Final  Culture, respiratory (non-expectorated)  Status: None   Collection Time: 08/13/19  4:10 PM   Specimen: Tracheal Aspirate; Respiratory  Result Value Ref Range Status   Specimen Description TRACHEAL ASPIRATE  Final   Special Requests NONE  Final   Gram Stain   Final    FEW WBC PRESENT, PREDOMINANTLY PMN RARE GRAM POSITIVE COCCI RARE GRAM POSITIVE RODS Performed at [facility], [place] [address] [place], [place] [address]    Culture   Final    FEW ENTEROBACTER CLOACAE FEW STAPHYLOCOCCUS AUREUS    Report Status 08/16/2019 FINAL  Final   Organism ID, Bacteria STAPHYLOCOCCUS AUREUS  Final   Organism ID, Bacteria ENTEROBACTER CLOACAE  Final      Susceptibility   Enterobacter cloacae - MIC*    CEFAZOLIN >=64 RESISTANT Resistant     CEFEPIME 8 INTERMEDIATE Intermediate     CEFTAZIDIME >=64 RESISTANT Resistant     CIPROFLOXACIN <=0.25 SENSITIVE Sensitive     GENTAMICIN <=1  SENSITIVE Sensitive     IMIPENEM <=0.25 SENSITIVE Sensitive     TRIMETH/SULFA <=20 SENSITIVE Sensitive     PIP/TAZO >=128 RESISTANT Resistant     * FEW ENTEROBACTER CLOACAE   Staphylococcus aureus - MIC*    CIPROFLOXACIN <=0.5 SENSITIVE Sensitive     ERYTHROMYCIN >=8 RESISTANT Resistant     GENTAMICIN <=0.5 SENSITIVE Sensitive     OXACILLIN <=0.25 SENSITIVE Sensitive     TETRACYCLINE <=1 SENSITIVE Sensitive     VANCOMYCIN <=0.5 SENSITIVE Sensitive     TRIMETH/SULFA <=10 SENSITIVE Sensitive     CLINDAMYCIN RESISTANT Resistant     RIFAMPIN <=0.5 SENSITIVE Sensitive     Inducible Clindamycin POSITIVE Resistant     * FEW STAPHYLOCOCCUS AUREUS  Culture, blood (routine x 2)     Status: None   Collection Time: 08/13/19  4:31 PM   Specimen: BLOOD LEFT ARM  Result Value Ref Range Status   Specimen Description BLOOD LEFT ARM  Final   Special Requests   Final    BOTTLES DRAWN AEROBIC ONLY Blood Culture adequate volume   Culture   Final    NO GROWTH 5 DAYS Performed at [facility], [address], [place], [place] [address]    Report Status 08/18/2019 FINAL  Final  Culture, blood (routine x 2)     Status: None   Collection Time: 08/13/19  4:31 PM   Specimen: BLOOD LEFT ARM  Result Value Ref Range Status   Specimen Description BLOOD LEFT ARM  Final   Special Requests   Final    BOTTLES DRAWN AEROBIC ONLY Blood Culture adequate volume   Culture   Final    NO GROWTH 5 DAYS Performed at [facility], [place] [address], [place], [place] [address]    Report Status 08/18/2019 FINAL  Final  C difficile quick scan w PCR reflex     Status: None   Collection Time: 09/07/19  6:25 PM   Specimen: STOOL  Result Value Ref Range Status   C Diff antigen NEGATIVE NEGATIVE Final   C Diff toxin NEGATIVE NEGATIVE Final   C Diff interpretation No C. difficile detected.  Final    Comment: Performed at [facility], [place] [address] [place], [place] [address]    Anti-infectives:    Anti-infectives (From admission, onward)   Start     Dose/Rate Route Frequency Ordered Stop   08/06/19 1000  sulfamethoxazole-trimethoprim (BACTRIM) 200-40 MG/5ML suspension 20 mL     20 mL Per Tube Every  12 hours 08/06/19 0848 08/19/19 2231   08/04/19 1400  ceFEPIme (MAXIPIME) 2 g in sodium chloride 0.9 % 100 mL IVPB  Status:  Discontinued     2 g 200 mL/hr over 30 Minutes Intravenous Every 8 hours 08/04/19 1354 08/06/19 0941   07/24/19 1200  cefTRIAXone (ROCEPHIN) 2 g in sodium chloride 0.9 % 100 mL IVPB     2 g 200 mL/hr over 30 Minutes Intravenous Every 24 hours 07/24/19 1100 07/31/19 1138   07/23/19 0400  vancomycin (VANCOREADY) IVPB 1750 mg/350 mL  Status:  Discontinued     1,750 mg 175 mL/hr over 120 Minutes Intravenous Every 12 hours 07/22/19 1552 07/24/19 1100   07/22/19 1600  vancomycin (VANCOCIN) 2,500 mg in sodium chloride 0.9 % 500 mL IVPB     2,500 mg 250 mL/hr over 120 Minutes Intravenous  Once 07/22/19 1552 07/22/19 2214   07/22/19 1600  ceFEPIme (MAXIPIME) 2 g in sodium chloride 0.9 % 100 mL IVPB  Status:  Discontinued     2 g 200 mL/hr over 30 Minutes Intravenous Every 8 hours 07/22/19 1552 07/24/19 1100   07/05/19 2200  vancomycin (VANCOREADY) IVPB 1750 mg/350 mL  Status:  Discontinued     1,750 mg 175 mL/hr over 120 Minutes Intravenous Every 8 hours 07/05/19 1358 07/08/19 1039   07/05/19 1630  meropenem (MERREM) 1 g in sodium chloride 0.9 % 100 mL IVPB     1 g 200 mL/hr over 30 Minutes Intravenous Every 8 hours 07/05/19 1609 07/11/19 1816   07/05/19 1400  vancomycin (VANCOREADY) IVPB 2000 mg/400 mL     2,000 mg 200 mL/hr over 120 Minutes Intravenous  Once 07/05/19 1358 07/05/19 1644   06/30/2019 0600  ceFAZolin (ANCEF) IVPB 2g/100 mL premix  Status:  Discontinued     2 g 200 mL/hr over 30 Minutes Intravenous Every 8 hours 07/03/19 1201 07/05/19 1609   07/02/19 0845  levofloxacin (LEVAQUIN) IVPB 750 mg  Status:  Discontinued     750 mg 100 mL/hr over 90 Minutes  Intravenous Every 24 hours 07/02/19 0831 07/03/19 1201   07/15/2019 1732  bacitracin 50,000 Units in sodium chloride 0.9 % 500 mL irrigation  Status:  Discontinued       As needed 06/30/2019 1733 07/09/2019 1828   06/25/19 0400  vancomycin (VANCOREADY) IVPB 1500 mg/300 mL     1,500 mg 150 mL/hr over 120 Minutes Intravenous Every 12 hours 06/25/19 0121 06/25/19 1811   07/02/2019 2228  bacitracin 50,000 Units in sodium chloride 0.9 % 500 mL irrigation  Status:  Discontinued       As needed 06/23/2019 2229 06/25/19 0005   07/08/2019 2000  ceFAZolin (ANCEF) 3 g in dextrose 5 % 50 mL IVPB     3 g 100 mL/hr over 30 Minutes Intravenous  Once 07/06/2019 1947 07/07/2019 2031      Best Practice/Protocols:  VTE Prophylaxis: Lovenox (prophylaxtic dose) Continous Sedation  Consults:     Studies:    Events:  Subjective:    Overnight Issues:   Objective:  Vital signs for last 24 hours: Temp:  [98.9 F (37.2 C)-101.2 F (38.4 C)] 98.9 F (37.2 C) (03/22 0800) Pulse Rate:  [117-153] 117 (03/22 0831) Resp:  [19-37] 19 (03/22 0831) BP: (115-147)/(58-94) 134/83 (03/22 0831) SpO2:  [95 %-100 %] 100 % (03/22 0800) FiO2 (%):  [35 %] 35 % (03/22 0831) Weight:  [106 kg] 106 kg (03/22 0500)  Hemodynamic parameters for last 24 hours:    Intake/Output from  previous day: 03/21 0701 - 03/22 0700 In: 955.8 [NG/GT:955.8] Out: 1100 [Urine:1100]  Intake/Output this shift: No intake/output data recorded.  Vent settings for last 24 hours: FiO2 (%):  [35 %] 35 %  Physical Exam:  General: no respiratory distress Neuro: postures to stim, L pupil 6 R pupil 5 HEENT/Neck: trach with WD Resp: clear to auscultation bilaterally CVS: RRR GI: soft, NT, PEG Extremities: no edema  Results for orders placed or performed during the hospital encounter of 07/17/2019 (from the past 24 hour(s))  Urinalysis, Routine w reflex microscopic     Status: Abnormal   Collection Time: 09/08/19 12:00 PM  Result Value Ref Range    Color, Urine YELLOW YELLOW   APPearance HAZY (A) CLEAR   Specific Gravity, Urine 1.025 1.005 - 1.030   pH 5.0 5.0 - 8.0   Glucose, UA NEGATIVE NEGATIVE mg/dL   Hgb urine dipstick NEGATIVE NEGATIVE   Bilirubin Urine NEGATIVE NEGATIVE   Ketones, ur NEGATIVE NEGATIVE mg/dL   Protein, ur 30 (A) NEGATIVE mg/dL   Nitrite NEGATIVE NEGATIVE   Leukocytes,Ua NEGATIVE NEGATIVE   WBC, UA 11-20 0 - 5 WBC/hpf   Bacteria, UA NONE SEEN NONE SEEN   Mucus PRESENT   Glucose, capillary     Status: Abnormal   Collection Time: 09/08/19 12:07 PM  Result Value Ref Range   Glucose-Capillary 201 (H) 70 - 99 mg/dL  Glucose, capillary     Status: Abnormal   Collection Time: 09/08/19  3:30 PM  Result Value Ref Range   Glucose-Capillary 165 (H) 70 - 99 mg/dL   Comment 1 Notify RN    Comment 2 Document in Chart   Glucose, capillary     Status: Abnormal   Collection Time: 09/08/19  7:54 PM  Result Value Ref Range   Glucose-Capillary 165 (H) 70 - 99 mg/dL  Glucose, capillary     Status: Abnormal   Collection Time: 09/08/19 11:19 PM  Result Value Ref Range   Glucose-Capillary 190 (H) 70 - 99 mg/dL  Glucose, capillary     Status: Abnormal   Collection Time: 09/09/19  3:17 AM  Result Value Ref Range   Glucose-Capillary 160 (H) 70 - 99 mg/dL  CBC     Status: Abnormal   Collection Time: 09/09/19  5:00 AM  Result Value Ref Range   WBC 15.0 (H) 4.0 - 10.5 K/uL   RBC 4.46 4.22 - 5.81 MIL/uL   Hemoglobin 12.8 (L) 13.0 - 17.0 g/dL   HCT 42.0 39.0 - 52.0 %   MCV 94.2 80.0 - 100.0 fL   MCH 28.7 26.0 - 34.0 pg   MCHC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

## 2019-09-10 LAB — CREATININE, SERUM
Creatinine, Ser: 0.54 mg/dL — ABNORMAL LOW (ref 0.61–1.24)
GFR calc Af Amer: >60 mL/min (ref >60)
GFR calc non Af Amer: >60 mL/min (ref >60)

## 2019-09-10 LAB — CBC
HCT: 37.3 % — ABNORMAL LOW (ref 39.0–52.0)
Hemoglobin: 11 g/dL — ABNORMAL LOW (ref 13.0–17.0)
MCH: 28.6 pg (ref 26.0–34.0)
MCHC: 29.5 g/dL — ABNORMAL LOW (ref 30.0–36.0)
MCV: 96.9 fL (ref 80.0–100.0)
Platelets: 112 10*3/uL — ABNORMAL LOW (ref 150–400)
RBC: 3.85 MIL/uL — ABNORMAL LOW (ref 4.22–5.81)
RDW: 19.7 % — ABNORMAL HIGH (ref 11.5–15.5)
WBC: 14.8 10*3/uL — ABNORMAL HIGH (ref 4.0–10.5)
nRBC: 0 % (ref 0.0–0.2)

## 2019-09-10 LAB — GLUCOSE, CAPILLARY
Glucose-Capillary: 173 mg/dL — ABNORMAL HIGH (ref 70–99)
Glucose-Capillary: 153 mg/dL — ABNORMAL HIGH (ref 70–99)
Glucose-Capillary: 114 mg/dL — ABNORMAL HIGH (ref 70–99)
Glucose-Capillary: 136 mg/dL — ABNORMAL HIGH (ref 70–99)
Glucose-Capillary: 117 mg/dL — ABNORMAL HIGH (ref 70–99)

## 2019-09-10 LAB — MRSA PCR SCREENING: MRSA by PCR: NEGATIVE

## 2019-09-10 LAB — PHENOBARBITAL LEVEL: Phenobarbital: 15.9 ug/mL (ref 15.0–30.0)

## 2019-09-10 MED ORDER — POTASSIUM CHLORIDE 20 MEQ/15ML (10%) PO SOLN
40.0000 meq | Freq: Once | ORAL | Status: AC
  Administered 2019-09-10: 40 meq via ORAL
  Filled 2019-09-10: qty 30

## 2019-09-10 MED ORDER — CLONIDINE HCL 0.1 MG PO TABS
0.2000 mg | ORAL_TABLET | Freq: Four times a day (QID) | ORAL | Status: AC
  Administered 2019-09-10 – 2019-09-11 (×3): 0.2 mg
  Filled 2019-09-10 (×2): qty 2
  Filled 2019-09-10: qty 1

## 2019-09-10 MED ORDER — CLONIDINE HCL 0.1 MG PO TABS
0.1000 mg | ORAL_TABLET | Freq: Four times a day (QID) | ORAL | Status: DC
  Administered 2019-09-11 (×2): 0.1 mg
  Filled 2019-09-10 (×3): qty 1

## 2019-09-10 MED ORDER — CLONIDINE HCL 0.2 MG PO TABS
0.2000 mg | ORAL_TABLET | Freq: Four times a day (QID) | ORAL | Status: DC
  Administered 2019-09-10 (×2): 0.2 mg
  Filled 2019-09-10 (×2): qty 1

## 2019-09-10 MED ORDER — OXYCODONE HCL 5 MG/5ML PO SOLN
5.0000 mg | ORAL | Status: DC | PRN
  Administered 2019-09-11: 10 mg
  Filled 2019-09-10: qty 10

## 2019-09-10 MED ORDER — CLONIDINE HCL 0.1 MG PO TABS: 0.1000 mg | ORAL_TABLET | Freq: Every day | ORAL | Status: DC

## 2019-09-10 MED ORDER — LORAZEPAM 0.5 MG PO TABS
0.5000 mg | ORAL_TABLET | Freq: Two times a day (BID) | ORAL | Status: DC
  Administered 2019-09-10 – 2019-09-11 (×4): 0.5 mg
  Filled 2019-09-10 (×4): qty 1

## 2019-09-10 MED ORDER — CLONIDINE HCL 0.1 MG PO TABS: 0.1000 mg | ORAL_TABLET | Freq: Two times a day (BID) | ORAL | Status: DC

## 2019-09-10 MED ORDER — QUETIAPINE FUMARATE 200 MG PO TABS
200.0000 mg | ORAL_TABLET | Freq: Two times a day (BID) | ORAL | Status: DC
  Administered 2019-09-10 – 2019-09-11 (×4): 200 mg
  Filled 2019-09-10 (×2): qty 2
  Filled 2019-09-10: qty 1
  Filled 2019-09-10: qty 4
  Filled 2019-09-10: qty 2
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
## 2019-09-10 NOTE — TOC Progression Note (Signed)
Transition of Care [facility]) - Progression Note    Patient Details  Name: Jay Cole MRN: [id number] Date of Birth: [birth year]  Transition of Care [facility]) CM/SW [place], LCSW Phone Number: 09/10/2019, 3:49 PM  Clinical Narrative:     CSW advised by [facility] leadership to speak with patient's mother to discuss the next step in discharge planning process. CSW spoke with [name] patient's mother who states that her ultimate goal is to get the patient home, however she needs to wait until she gets a home of her own prior to doing this.   [name] is open to the patient going to a SNF in the [place], [place] area if possible. Plan is for [facility] to locate SNF that is able to accomodate patient's needs.   Expected Discharge Plan: Long Term Acute Care (LTAC) Barriers to Discharge: Continued Medical Work up, Vent Bed not available  Expected Discharge Plan and Services Expected Discharge Plan: Long Term Acute Care (LTAC)   Discharge Planning Services: CM Consult Post Acute Care Choice: Long Term Acute Care (LTAC) Living arrangements for the past 2 months: Single Family Home                                       Social Determinants of Health (SDOH) Interventions    Readmission Risk Interventions No flowsheet data found.
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
## 2019-09-10 NOTE — Progress Notes (Signed)
Patient transferred to 4NP04. Mother called and updated. Thayer Ohm D
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
## 2019-09-10 NOTE — Procedures (Signed)
Tracheostomy Change Note  Patient Details:   Name: ELOHIM VERMETTE DOB: [birth year] MRN: [id number]    Airway Documentation:     Evaluation  O2 sats: stable throughout Complications: No apparent complications Patient did tolerate procedure well. Bilateral Breath Sounds: Rhonchi, Diminished    Positive color change on the CO2 detector, bilateral breath sounds, small amount of bleeding from the trach.  Bayard Beaver 09/10/2019, 12:52 PM

## 2019-09-11 LAB — GLUCOSE, CAPILLARY
Glucose-Capillary: 101 mg/dL — ABNORMAL HIGH (ref 70–99)
Glucose-Capillary: 120 mg/dL — ABNORMAL HIGH (ref 70–99)
Glucose-Capillary: 167 mg/dL — ABNORMAL HIGH (ref 70–99)
Glucose-Capillary: 86 mg/dL (ref 70–99)
Glucose-Capillary: 95 mg/dL (ref 70–99)
Glucose-Capillary: 99 mg/dL (ref 70–99)

## 2019-09-11 MED ORDER — PHENOBARBITAL SODIUM 65 MG/ML IJ SOLN
65.0000 mg | Freq: Once | INTRAMUSCULAR | Status: AC
  Administered 2019-09-11: 65 mg via INTRAVENOUS
  Filled 2019-09-11: qty 1

## 2019-09-11 MED ORDER — LORAZEPAM 2 MG/ML IJ SOLN
INTRAMUSCULAR | Status: AC
  Administered 2019-09-11: 2 mg
  Filled 2019-09-11: qty 1

## 2019-09-11 MED ORDER — PHENOBARBITAL 20 MG/5ML PO ELIX
90.0000 mg | ORAL_SOLUTION | Freq: Four times a day (QID) | ORAL | Status: DC
  Administered 2019-09-11: 90 mg
  Filled 2019-09-11: qty 22.5

## 2019-09-11 MED ORDER — LORAZEPAM 2 MG/ML IJ SOLN
2.0000 mg | Freq: Four times a day (QID) | INTRAMUSCULAR | Status: DC
  Administered 2019-09-11 (×2): 2 mg via INTRAVENOUS
  Filled 2019-09-11 (×3): qty 1

## 2019-09-11 MED ORDER — PIVOT 1.5 CAL PO LIQD
1000.0000 mL | ORAL | Status: DC
  Administered 2019-09-11: 1000 mL
  Filled 2019-09-11 (×2): qty 1000

## 2019-09-11 NOTE — Progress Notes (Signed)
PT HR 140s RR 40s and pt is shaking all over. MD notified. New orders for ativan and phenobarbital given to pt per [facility]. Will cont. To monitor

## 2019-09-11 NOTE — Plan of Care (Signed)
  Problem: Education: Goal: Knowledge of General Education information will improve Description: Including pain rating scale, medication(s)/side effects and non-pharmacologic comfort measures Outcome: Progressing   Problem: Health Behavior/Discharge Planning: Goal: Ability to manage health-related needs will improve Outcome: Progressing   Problem: Clinical Measurements: Goal: Ability to maintain clinical measurements within normal limits will improve Outcome: Progressing Goal: Will remain free from infection Outcome: Progressing Goal: Diagnostic test results will improve Outcome: Progressing Goal: Respiratory complications will improve Outcome: Progressing Goal: Cardiovascular complication will be avoided Outcome: Progressing   Problem: Activity: Goal: Risk for activity intolerance will decrease Outcome: Progressing   Problem: Nutrition: Goal: Adequate nutrition will be maintained Outcome: Progressing   Problem: Coping: Goal: Level of anxiety will decrease Outcome: Progressing   Problem: Elimination: Goal: Will not experience complications related to bowel motility Outcome: Progressing Goal: Will not experience complications related to urinary retention Outcome: Progressing   Problem: Pain Managment: Goal: General experience of comfort will improve Outcome: Progressing   Problem: Safety: Goal: Ability to remain free from injury will improve Outcome: Progressing   Problem: Skin Integrity: Goal: Risk for impaired skin integrity will decrease Outcome: Progressing   Problem: Education: Goal: Knowledge of the prescribed therapeutic regimen Outcome: Progressing Goal: Knowledge of disease or condition will improve Outcome: Progressing   Problem: Clinical Measurements: Goal: Neurologic status will improve Outcome: Progressing   Problem: Tissue Perfusion: Goal: Ability to maintain intracranial pressure will improve Outcome: Progressing   Problem:  Respiratory: Goal: Will regain and/or maintain adequate ventilation Outcome: Progressing   Problem: Skin Integrity: Goal: Risk for impaired skin integrity will decrease Outcome: Progressing Goal: Demonstration of wound healing without infection will improve Outcome: Progressing   Problem: Psychosocial: Goal: Ability to verbalize positive feelings about self will improve Outcome: Progressing Goal: Ability to participate in self-care as condition permits will improve Outcome: Progressing Goal: Ability to identify appropriate support needs will improve Outcome: Progressing   Problem: Health Behavior/Discharge Planning: Goal: Ability to manage health-related needs will improve Outcome: Progressing   Problem: Nutritional: Goal: Risk of aspiration will decrease Outcome: Progressing Goal: Dietary intake will improve Outcome: Progressing   Problem: Communication: Goal: Ability to communicate needs accurately will improve Outcome: Progressing
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
## 2019-09-11 NOTE — Clinical Social Work Note (Signed)
[facility] [place]. Initial report only contained initial h&p.

## 2019-09-11 NOTE — Progress Notes (Signed)
Patient ID: Jay Cole, male   DOB: 09-04-1995, 24 y.o.   MRN: [id number] Vedh appears to be having some neuro storming this morning.  His phenobarbital level yesterday was 15 and this is just barely therapeutic I believe that we can increase the dose to 90 mg every 6 hours which I will do today.  Hopefully this will help bring his storming under further control
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
## 2019-09-12 ENCOUNTER — Encounter (HOSPITAL_COMMUNITY): Payer: Self-pay

## 2019-09-12 MED FILL — Medication: Qty: 1 | Status: AC

## 2019-09-13 LAB — CULTURE, BLOOD (ROUTINE X 2)
Culture: NO GROWTH
Culture: NO GROWTH
Special Requests: ADEQUATE
Special Requests: ADEQUATE

## 2019-09-19 NOTE — Progress Notes (Signed)
RN noticed pt's HR on monitor drop to the 50s. RN checked on pt and pt was not breathing. Code was called. PT was tx to 4NICU.
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
## 2019-09-19 NOTE — Significant Event (Signed)
PATIENT NAME: Jay Cole RECORD NUMBER: [id number] Birthday: 1996-03-27  Age: 24 y.o. Admit Date: 07/18/2019  Indication: PEA arrest  Technical Description:   CPR performance duration: roughly 20 minutes  Was defibrillation or cardioversion used ? Yes. One episode of VT about halfway through code.   Was external pacer placed ? no  Was patient intubated pre/post CPR ? Trach pre  Was transvenous pacer placed ? no  Medications Administered Include      Yes/no Amiodarone  Y-300mg   Atropin   Calcium   Epinephrine  Y  Lidocaine   Magnesium   Norepinephrine   Phenylephrine   Sodium bicarbonate   Vasopression    Evaluation  Final Status - Was patient successfully resuscitated ? Yes  If successfully resuscitated - what is current rhythm ? ST If successfully resuscitated - what is current hemodynamic status ? Unstable  Miscellaneous Information  I was in 4N ICU when I heard code alarm in 4NP. I arrived at bedside just moments after with CPR In progress. RT in process of inflating trach cuff to deliver BVM rescue breathing. PEA algorithm. One episode of VT about 10 mins in, treated with 120J defibrillation followed by amiodarone 300mg  bolus. PEA algorithm thereafter. Approximate time 20 mins. See code sheet for further details.    I did contact mother [name] and updated regarding cardiac arrest and ongoing resuscitation.   Upon ROSC, [facility] attending was at bedside and assumed care of the patient.    Georgann Housekeeper, AGACNP-BC [place]  See Amion for personal pager PCCM on call pager [phone]  2019/09/23 12:49 AM

## 2019-09-19 NOTE — Progress Notes (Signed)
Notified of ongoing CPR. Appreciate CCM management. Arrived to bedside. With cessation of compressions, ROSC confirmed, but patient is hypotensive. Will transfer to ICU. Called patient's mother and explained arrest and ROSC with hypotension, and also that I believe it is futile to continue escalation of care, that escalation of care will not improve or change his outcome in any way. I informed her of his hypotension and that in my medical opinion, the risks outweigh the benefits for central line placement and because of this, I informed her that I will not be placing a central line. I will continue to administer vasopressor agents via existing access. I also informed her that based on the multiple discussions we have had over the last nearly three months, continued resuscitative efforts are futile and will not change his dismal neurologic outcome and I will change his code status to DNR. I clearly explained that means no further chest compressions or defibrillation will occur. I counseled her that he will likely die this evening and recommended she come in to spend time with him. She is currently on her way in from [place] with her husband. I offered the opportunity to ask questions, which she declined, and also offered my sympathies during this difficult time for her.   [name], MD General and [facility]

## 2019-09-19 NOTE — Progress Notes (Signed)
Arrived to code at 23:45, however taken over by CCM team.   Lattie Haw PGY1, [place]

## 2019-09-19 DEATH — deceased

## 2019-09-24 LAB — ALDOSTERONE + RENIN ACTIVITY W/ RATIO
ALDO / PRA Ratio: 1.3 (ref 0.0–30.0)
Aldosterone: 10.8 ng/dL (ref 0.0–30.0)
PRA LC/MS/MS: 8.588 ng/mL/hr — ABNORMAL HIGH (ref 0.167–5.380)
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
